# Patient Record
Sex: Male | Born: 1955 | Race: White | Hispanic: No | Smoking: Former smoker
Health system: Southern US, Community
[De-identification: ages and names within clinical notes are randomized; demographics above are authoritative.]

## PROBLEM LIST (undated history)

## (undated) DIAGNOSIS — K635 Polyp of colon: Secondary | ICD-10-CM

## (undated) DIAGNOSIS — G8929 Other chronic pain: Secondary | ICD-10-CM

## (undated) DIAGNOSIS — E7800 Pure hypercholesterolemia, unspecified: Secondary | ICD-10-CM

## (undated) DIAGNOSIS — N400 Enlarged prostate without lower urinary tract symptoms: Secondary | ICD-10-CM

## (undated) DIAGNOSIS — F32A Depression, unspecified: Secondary | ICD-10-CM

## (undated) DIAGNOSIS — I10 Essential (primary) hypertension: Secondary | ICD-10-CM

## (undated) DIAGNOSIS — Z9289 Personal history of other medical treatment: Secondary | ICD-10-CM

## (undated) DIAGNOSIS — K649 Unspecified hemorrhoids: Secondary | ICD-10-CM

## (undated) DIAGNOSIS — N209 Urinary calculus, unspecified: Secondary | ICD-10-CM

## (undated) DIAGNOSIS — K559 Vascular disorder of intestine, unspecified: Secondary | ICD-10-CM

## (undated) DIAGNOSIS — J189 Pneumonia, unspecified organism: Secondary | ICD-10-CM

## (undated) DIAGNOSIS — G4730 Sleep apnea, unspecified: Secondary | ICD-10-CM

## (undated) DIAGNOSIS — F329 Major depressive disorder, single episode, unspecified: Secondary | ICD-10-CM

## (undated) DIAGNOSIS — I499 Cardiac arrhythmia, unspecified: Secondary | ICD-10-CM

## (undated) DIAGNOSIS — M1990 Unspecified osteoarthritis, unspecified site: Secondary | ICD-10-CM

## (undated) DIAGNOSIS — J302 Other seasonal allergic rhinitis: Secondary | ICD-10-CM

## (undated) HISTORY — PX: BACK SURGERY: SHX140

## (undated) HISTORY — DX: Personal history of other medical treatment: Z92.89

## (undated) HISTORY — DX: Polyp of colon: K63.5

## (undated) HISTORY — PX: WRIST SURGERY: SHX841

## (undated) HISTORY — PX: ELBOW SURGERY: SHX618

## (undated) HISTORY — DX: Pure hypercholesterolemia, unspecified: E78.00

## (undated) HISTORY — PX: SPINE SURGERY: SHX786

## (undated) HISTORY — PX: SHOULDER SURGERY: SHX246

## (undated) HISTORY — DX: Vascular disorder of intestine, unspecified: K55.9

## (undated) HISTORY — PX: NECK SURGERY: SHX720

## (undated) HISTORY — DX: Sleep apnea, unspecified: G47.30

## (undated) HISTORY — DX: Essential (primary) hypertension: I10

## (undated) HISTORY — DX: Unspecified hemorrhoids: K64.9

## (undated) HISTORY — PX: NASAL SINUS SURGERY: SHX719

## (undated) HISTORY — DX: Depression, unspecified: F32.A

## (undated) HISTORY — DX: Benign prostatic hyperplasia without lower urinary tract symptoms: N40.0

## (undated) HISTORY — PX: TRIGGER FINGER RELEASE: SHX641

## (undated) HISTORY — PX: OTHER SURGICAL HISTORY: SHX169

## (undated) HISTORY — DX: Other seasonal allergic rhinitis: J30.2

## (undated) HISTORY — DX: Unspecified osteoarthritis, unspecified site: M19.90

## (undated) HISTORY — DX: Major depressive disorder, single episode, unspecified: F32.9

---

## 1999-03-12 ENCOUNTER — Emergency Department (HOSPITAL_COMMUNITY): Admission: EM | Admit: 1999-03-12 | Discharge: 1999-03-12 | Payer: Self-pay | Admitting: Emergency Medicine

## 1999-03-13 ENCOUNTER — Encounter: Payer: Self-pay | Admitting: Emergency Medicine

## 1999-03-14 ENCOUNTER — Emergency Department (HOSPITAL_COMMUNITY): Admission: EM | Admit: 1999-03-14 | Discharge: 1999-03-14 | Payer: Self-pay | Admitting: Emergency Medicine

## 1999-06-20 ENCOUNTER — Encounter: Admission: RE | Admit: 1999-06-20 | Discharge: 1999-07-29 | Payer: Self-pay | Admitting: Family Medicine

## 1999-06-25 ENCOUNTER — Encounter: Payer: Self-pay | Admitting: Family Medicine

## 1999-06-25 ENCOUNTER — Ambulatory Visit (HOSPITAL_COMMUNITY): Admission: RE | Admit: 1999-06-25 | Discharge: 1999-06-25 | Payer: Self-pay | Admitting: Family Medicine

## 1999-10-11 ENCOUNTER — Encounter: Admission: RE | Admit: 1999-10-11 | Discharge: 1999-11-22 | Payer: Self-pay | Admitting: Neurology

## 2000-08-07 ENCOUNTER — Encounter: Payer: Self-pay | Admitting: Family Medicine

## 2000-08-07 ENCOUNTER — Encounter: Admission: RE | Admit: 2000-08-07 | Discharge: 2000-08-07 | Payer: Self-pay | Admitting: Family Medicine

## 2001-01-11 ENCOUNTER — Encounter: Payer: Self-pay | Admitting: Neurosurgery

## 2001-01-11 ENCOUNTER — Ambulatory Visit (HOSPITAL_COMMUNITY): Admission: RE | Admit: 2001-01-11 | Discharge: 2001-01-11 | Payer: Self-pay | Admitting: Neurosurgery

## 2001-02-06 ENCOUNTER — Ambulatory Visit (HOSPITAL_COMMUNITY): Admission: RE | Admit: 2001-02-06 | Discharge: 2001-02-06 | Payer: Self-pay | Admitting: Neurosurgery

## 2001-02-06 ENCOUNTER — Encounter: Payer: Self-pay | Admitting: Neurosurgery

## 2001-07-22 ENCOUNTER — Emergency Department (HOSPITAL_COMMUNITY): Admission: EM | Admit: 2001-07-22 | Discharge: 2001-07-22 | Payer: Self-pay | Admitting: Emergency Medicine

## 2001-07-22 ENCOUNTER — Encounter: Payer: Self-pay | Admitting: Emergency Medicine

## 2003-06-24 ENCOUNTER — Encounter: Admission: RE | Admit: 2003-06-24 | Discharge: 2003-06-24 | Payer: Self-pay | Admitting: Family Medicine

## 2004-04-16 ENCOUNTER — Emergency Department: Payer: Self-pay | Admitting: Emergency Medicine

## 2004-10-12 ENCOUNTER — Ambulatory Visit (HOSPITAL_COMMUNITY): Admission: RE | Admit: 2004-10-12 | Discharge: 2004-10-12 | Payer: Self-pay | Admitting: Neurosurgery

## 2005-01-04 ENCOUNTER — Encounter: Admission: RE | Admit: 2005-01-04 | Discharge: 2005-04-04 | Payer: Self-pay | Admitting: Orthopedic Surgery

## 2005-01-25 ENCOUNTER — Emergency Department (HOSPITAL_COMMUNITY): Admission: EM | Admit: 2005-01-25 | Discharge: 2005-01-26 | Payer: Self-pay | Admitting: Emergency Medicine

## 2005-01-27 ENCOUNTER — Emergency Department (HOSPITAL_COMMUNITY): Admission: EM | Admit: 2005-01-27 | Discharge: 2005-01-27 | Payer: Self-pay | Admitting: Diagnostic Radiology

## 2005-01-30 ENCOUNTER — Inpatient Hospital Stay (HOSPITAL_COMMUNITY): Admission: EM | Admit: 2005-01-30 | Discharge: 2005-02-02 | Payer: Self-pay | Admitting: Emergency Medicine

## 2005-01-31 ENCOUNTER — Encounter (INDEPENDENT_AMBULATORY_CARE_PROVIDER_SITE_OTHER): Payer: Self-pay | Admitting: *Deleted

## 2005-02-13 LAB — HM COLONOSCOPY: HM Colonoscopy: NORMAL

## 2005-03-06 ENCOUNTER — Encounter: Admission: RE | Admit: 2005-03-06 | Discharge: 2005-03-06 | Payer: Self-pay | Admitting: Gastroenterology

## 2005-04-12 ENCOUNTER — Encounter: Admission: RE | Admit: 2005-04-12 | Discharge: 2005-04-12 | Payer: Self-pay | Admitting: Gastroenterology

## 2005-09-25 ENCOUNTER — Ambulatory Visit: Payer: Self-pay | Admitting: Family Medicine

## 2006-05-22 ENCOUNTER — Emergency Department (HOSPITAL_COMMUNITY): Admission: EM | Admit: 2006-05-22 | Discharge: 2006-05-22 | Payer: Self-pay | Admitting: Emergency Medicine

## 2006-06-05 ENCOUNTER — Ambulatory Visit (HOSPITAL_BASED_OUTPATIENT_CLINIC_OR_DEPARTMENT_OTHER): Admission: RE | Admit: 2006-06-05 | Discharge: 2006-06-05 | Payer: Self-pay | Admitting: Family Medicine

## 2006-06-10 ENCOUNTER — Ambulatory Visit: Payer: Self-pay | Admitting: Internal Medicine

## 2006-08-29 ENCOUNTER — Encounter: Admission: RE | Admit: 2006-08-29 | Discharge: 2006-08-29 | Payer: Self-pay | Admitting: Family Medicine

## 2006-09-04 ENCOUNTER — Encounter: Admission: RE | Admit: 2006-09-04 | Discharge: 2006-09-04 | Payer: Self-pay | Admitting: Neurosurgery

## 2006-09-12 ENCOUNTER — Observation Stay (HOSPITAL_COMMUNITY): Admission: RE | Admit: 2006-09-12 | Discharge: 2006-09-13 | Payer: Self-pay | Admitting: Otolaryngology

## 2006-10-18 ENCOUNTER — Ambulatory Visit (HOSPITAL_COMMUNITY): Admission: RE | Admit: 2006-10-18 | Discharge: 2006-10-19 | Payer: Self-pay | Admitting: Neurosurgery

## 2006-11-16 ENCOUNTER — Emergency Department (HOSPITAL_COMMUNITY): Admission: EM | Admit: 2006-11-16 | Discharge: 2006-11-16 | Payer: Self-pay | Admitting: Emergency Medicine

## 2007-05-07 ENCOUNTER — Ambulatory Visit: Payer: Self-pay | Admitting: Internal Medicine

## 2007-05-07 DIAGNOSIS — S139XXA Sprain of joints and ligaments of unspecified parts of neck, initial encounter: Secondary | ICD-10-CM | POA: Insufficient documentation

## 2007-06-12 ENCOUNTER — Encounter: Admission: RE | Admit: 2007-06-12 | Discharge: 2007-06-12 | Payer: Self-pay | Admitting: Occupational Medicine

## 2007-09-03 ENCOUNTER — Emergency Department (HOSPITAL_COMMUNITY): Admission: EM | Admit: 2007-09-03 | Discharge: 2007-09-03 | Payer: Self-pay | Admitting: Emergency Medicine

## 2007-11-04 ENCOUNTER — Inpatient Hospital Stay (HOSPITAL_COMMUNITY): Admission: EM | Admit: 2007-11-04 | Discharge: 2007-11-06 | Payer: Self-pay | Admitting: Emergency Medicine

## 2007-11-13 ENCOUNTER — Encounter: Admission: RE | Admit: 2007-11-13 | Discharge: 2007-11-13 | Payer: Self-pay | Admitting: Neurosurgery

## 2008-01-14 ENCOUNTER — Inpatient Hospital Stay (HOSPITAL_COMMUNITY): Admission: EM | Admit: 2008-01-14 | Discharge: 2008-01-20 | Payer: Self-pay | Admitting: Emergency Medicine

## 2009-06-14 ENCOUNTER — Ambulatory Visit: Payer: Self-pay | Admitting: Family Medicine

## 2009-06-14 DIAGNOSIS — I10 Essential (primary) hypertension: Secondary | ICD-10-CM

## 2009-06-14 DIAGNOSIS — N400 Enlarged prostate without lower urinary tract symptoms: Secondary | ICD-10-CM | POA: Insufficient documentation

## 2009-06-14 DIAGNOSIS — N529 Male erectile dysfunction, unspecified: Secondary | ICD-10-CM | POA: Insufficient documentation

## 2009-06-14 DIAGNOSIS — F17200 Nicotine dependence, unspecified, uncomplicated: Secondary | ICD-10-CM

## 2009-07-07 ENCOUNTER — Encounter (INDEPENDENT_AMBULATORY_CARE_PROVIDER_SITE_OTHER): Payer: Self-pay | Admitting: *Deleted

## 2009-07-07 ENCOUNTER — Ambulatory Visit: Payer: Self-pay | Admitting: Family Medicine

## 2009-07-07 DIAGNOSIS — E785 Hyperlipidemia, unspecified: Secondary | ICD-10-CM | POA: Insufficient documentation

## 2009-07-07 LAB — CONVERTED CEMR LAB
Albumin: 4 g/dL (ref 3.5–5.2)
BUN: 8 mg/dL (ref 6–23)
Calcium: 9.1 mg/dL (ref 8.4–10.5)
Cholesterol: 178 mg/dL (ref 0–200)
Creatinine, Ser: 0.9 mg/dL (ref 0.4–1.5)
GFR calc non Af Amer: 94.05 mL/min (ref >60)
Glucose, Bld: 95 mg/dL (ref 70–99)
HDL: 49.8 mg/dL (ref >39.00)
Sodium: 139 meq/L (ref 135–145)
Total Protein: 7 g/dL (ref 6.0–8.3)
Triglycerides: 171 mg/dL — ABNORMAL HIGH (ref 0.0–149.0)
VLDL: 34.2 mg/dL (ref 0.0–40.0)

## 2009-08-02 ENCOUNTER — Telehealth (INDEPENDENT_AMBULATORY_CARE_PROVIDER_SITE_OTHER): Payer: Self-pay | Admitting: *Deleted

## 2009-08-03 ENCOUNTER — Encounter: Payer: Self-pay | Admitting: Family Medicine

## 2009-08-09 ENCOUNTER — Telehealth (INDEPENDENT_AMBULATORY_CARE_PROVIDER_SITE_OTHER): Payer: Self-pay | Admitting: *Deleted

## 2009-08-13 ENCOUNTER — Ambulatory Visit: Payer: Self-pay | Admitting: Family Medicine

## 2009-08-13 DIAGNOSIS — G43009 Migraine without aura, not intractable, without status migrainosus: Secondary | ICD-10-CM | POA: Insufficient documentation

## 2009-08-15 ENCOUNTER — Encounter: Admission: RE | Admit: 2009-08-15 | Discharge: 2009-08-15 | Payer: Self-pay | Admitting: Neurosurgery

## 2009-08-20 ENCOUNTER — Telehealth: Payer: Self-pay | Admitting: Family Medicine

## 2009-10-05 ENCOUNTER — Ambulatory Visit: Payer: Self-pay | Admitting: Family Medicine

## 2009-10-13 ENCOUNTER — Telehealth (INDEPENDENT_AMBULATORY_CARE_PROVIDER_SITE_OTHER): Payer: Self-pay | Admitting: *Deleted

## 2009-10-13 ENCOUNTER — Ambulatory Visit: Payer: Self-pay | Admitting: Family Medicine

## 2009-10-27 ENCOUNTER — Telehealth: Payer: Self-pay | Admitting: Family Medicine

## 2009-11-02 ENCOUNTER — Emergency Department (HOSPITAL_COMMUNITY): Admission: EM | Admit: 2009-11-02 | Discharge: 2009-11-02 | Payer: Self-pay | Admitting: Emergency Medicine

## 2009-12-08 ENCOUNTER — Encounter: Admission: RE | Admit: 2009-12-08 | Discharge: 2009-12-08 | Payer: Self-pay | Admitting: Neurosurgery

## 2010-01-06 ENCOUNTER — Ambulatory Visit: Payer: Self-pay | Admitting: Family Medicine

## 2010-01-06 DIAGNOSIS — M545 Low back pain: Secondary | ICD-10-CM

## 2010-01-26 ENCOUNTER — Encounter: Admission: RE | Admit: 2010-01-26 | Discharge: 2010-01-26 | Payer: Self-pay | Admitting: Neurosurgery

## 2010-04-19 ENCOUNTER — Telehealth (INDEPENDENT_AMBULATORY_CARE_PROVIDER_SITE_OTHER): Payer: Self-pay | Admitting: *Deleted

## 2010-04-21 ENCOUNTER — Ambulatory Visit: Payer: Self-pay | Admitting: Family Medicine

## 2010-04-21 DIAGNOSIS — M542 Cervicalgia: Secondary | ICD-10-CM | POA: Insufficient documentation

## 2010-04-27 ENCOUNTER — Encounter: Admission: RE | Admit: 2010-04-27 | Discharge: 2010-04-27 | Payer: Self-pay | Admitting: Family Medicine

## 2010-05-05 ENCOUNTER — Ambulatory Visit: Payer: Self-pay | Admitting: Family Medicine

## 2010-05-06 ENCOUNTER — Ambulatory Visit: Payer: Self-pay | Admitting: Family Medicine

## 2010-05-09 LAB — CONVERTED CEMR LAB
ALT: 17 units/L (ref 0–53)
BUN: 15 mg/dL (ref 6–23)
Bilirubin, Direct: 0.2 mg/dL (ref 0.0–0.3)
Chloride: 107 meq/L (ref 96–112)
Cholesterol: 162 mg/dL (ref 0–200)
Creatinine, Ser: 0.8 mg/dL (ref 0.4–1.5)
GFR calc non Af Amer: 102.93 mL/min (ref >60)
HDL: 40 mg/dL (ref >39.00)
LDL Cholesterol: 98 mg/dL (ref 0–99)
Total Bilirubin: 0.7 mg/dL (ref 0.3–1.2)
Total CHOL/HDL Ratio: 4
Triglycerides: 119 mg/dL (ref 0.0–149.0)
VLDL: 23.8 mg/dL (ref 0.0–40.0)

## 2010-06-07 ENCOUNTER — Telehealth: Payer: Self-pay | Admitting: Family Medicine

## 2010-06-21 ENCOUNTER — Telehealth (INDEPENDENT_AMBULATORY_CARE_PROVIDER_SITE_OTHER): Payer: Self-pay | Admitting: *Deleted

## 2010-07-17 HISTORY — PX: BACK SURGERY: SHX140

## 2010-07-27 ENCOUNTER — Telehealth: Payer: Self-pay | Admitting: Family Medicine

## 2010-08-03 ENCOUNTER — Encounter: Payer: Self-pay | Admitting: Family Medicine

## 2010-08-14 LAB — CONVERTED CEMR LAB
ALT: 20 units/L (ref 0–53)
AST: 21 units/L (ref 0–37)
Albumin: 3.4 g/dL — ABNORMAL LOW (ref 3.5–5.2)
Alkaline Phosphatase: 61 units/L (ref 39–117)
CO2: 31 meq/L (ref 19–32)
Calcium: 8.8 mg/dL (ref 8.4–10.5)
Creatinine, Ser: 0.9 mg/dL (ref 0.4–1.5)
Eosinophils Relative: 1.9 % (ref 0.0–5.0)
GFR calc non Af Amer: 93.95 mL/min (ref >60)
Glucose, Bld: 101 mg/dL — ABNORMAL HIGH (ref 70–99)
HCT: 47.5 % (ref 39.0–52.0)
Hemoglobin: 15.4 g/dL (ref 13.0–17.0)
Lymphocytes Relative: 19.7 % (ref 12.0–46.0)
Lymphs Abs: 1.9 10*3/uL (ref 0.7–4.0)
Monocytes Relative: 7.2 % (ref 3.0–12.0)
Neutro Abs: 7 10*3/uL (ref 1.4–7.7)
Sodium: 144 meq/L (ref 135–145)
Total Bilirubin: 0.3 mg/dL (ref 0.3–1.2)
WBC: 9.8 10*3/uL (ref 4.5–10.5)

## 2010-08-17 ENCOUNTER — Ambulatory Visit: Admit: 2010-08-17 | Payer: Self-pay | Admitting: Family Medicine

## 2010-08-17 ENCOUNTER — Encounter: Payer: Self-pay | Admitting: Family Medicine

## 2010-08-18 NOTE — Progress Notes (Signed)
Summary: Pt still having pain/waiting on cb from [facility] 11/22  Phone Note Call from Patient   Caller: Patient Call For: Loreen Freud DO Details for Reason: Still having Heasdaches Summary of Call: Rcv'd mssg from patient and he stated that he was having headaches and using 10-12 BC powders per day, says there is no change from the last OV also c/o groin pain.... Almeta Monas CMA Duncan Dull)  June 07, 2010 12:07 PM   I called patient and he stated he has been having the neck pain that is causing the headAches, says he is taking 10-12 BC powder's per day and it is not helping at all, I asked was he seeing pain management, says he has been waiting for an appt for the last 2 months, asked has he call [facility] about appt and he stated he left 2 mssg's with no reply.  says he has a f/u in Jan, adv pt he will need to be seen sooner for this pain, advised he can't continue to take [facility] powder especially if it is not working. He voiced understanding, advised pt I will call [facility] and find out what is going with appt's and see if we can get him seen sooner, he agreed.  I called [facility] and Dr.Roy was in surgery all day, His nurse Tammy was off, and his assistant Amil Amen was at lunch, I left a voicemail for Amil Amen to give [place] a call back..... Almeta Monas CMA Duncan Dull)  June 07, 2010 12:10 PM   Follow-up for Phone Call        spk with Amil Amen and she stated that patient gets meds from [facility] every month pt gets Percocet and Flexeril, as of september 16th he is taking 3 of each everyday. Says hewas released from Dr. Thyra Breed (pain management) for failing 2 drug test and he had the fentyl patch on at both drug screens, per Amil Amen patient stated he did not know how they got there. The patient is supposed to find a pain clinic on his own but never contacted them with details and they sent referral informatoin to a Dr.Rouse in [place] for pain management and waiting on a call back. She stated the only time the  patient calls them is when he needs a refill, he never advised them he was taking so many BC powders. Patient called for a refill of pain meds today and she asked did he ask [place] for some, I advised no.  She stated she will let Dr.Roy know about this phone call as well.....  Follow-up by: Almeta Monas CMA Duncan Dull),  June 08, 2010 10:58 AM  Additional Follow-up for Phone Call Additional follow up Details #1::        Call back from [place] who stated she spoke with Dr. Channing Mutters and he wanted Dr.Lowne to handle the pt's issue of lying in the previous conversation.... Additional Follow-up by: Almeta Monas CMA Duncan Dull),  June 08, 2010 11:02 AM    Additional Follow-up for Phone Call Additional follow up Details #2::    We will not be able to give the pt any pain medication.  He is getting monthly meds from Dr Channing Mutters and they are getting him into pain management which may take a while since he failed to drug screens in previous pain management.   Follow-up by: Loreen Freud DO,  June 08, 2010 11:18 AM  Additional Follow-up for Phone Call Additional follow up Details #3:: Details for Additional Follow-up Action Taken: spoke with pt and I  made him aware of the above, advised to f/u for pain with pain management and/or Dr.Roy's office. Additional Follow-up by: Almeta Monas CMA Duncan Dull),  June 08, 2010 12:05 PM

## 2010-08-18 NOTE — Progress Notes (Signed)
Summary: Wants Maxalt Refill/Sample  Phone Note Call from Patient   Refills Requested: Medication #1:  MAXALT-MLT 10 MG TBDP as directed--no more than 2 in 24 hours Caller: Patient Call For: Loreen Freud DO Summary of Call: spoke with patient and he stated he had a nerve study done and now he has a Migraine, wanted to know if he could get his Maxalt called to [facility] on [address] and also a sample of Levitra.... I advised we did not have any Levitra samples. He voiced understaning. Maxalt last filled  08/20/09 and patient last seen 05/10/10 please advise.... Almeta Monas CMA Duncan Dull)  July 27, 2010 4:47 PM   Follow-up for Phone Call        ok to refill maxalt 1x Follow-up by: Loreen Freud DO,  July 28, 2010 7:36 AM  Additional Follow-up for Phone Call Additional follow up Details #1::        pt aware Rx sent to the pharmacy... Almeta Monas CMA Duncan Dull)  July 28, 2010 9:18 AM     Prescriptions: MAXALT-MLT 10 MG TBDP (RIZATRIPTAN BENZOATE) as directed--no more than 2 in 24 hours  #9 x 0   Entered and Authorized by:   Loreen Freud DO   Signed by:   Loreen Freud DO on 07/28/2010   Method used:   Electronically to        [facility] [address] (retail)       [phone]       [place], [place]  [address]       Ph: [phone]       Fax: [phone]   RxID:   [id number]

## 2010-08-18 NOTE — Medication Information (Signed)
Summary: Prior Authorization & Approval for [employer]  Prior Authorization & Approval for [employer]   Imported By: Lanelle Bal 08/11/2009 13:10:49  _____________________________________________________________________  External Attachment:    Type:   Image     Comment:   External Document

## 2010-08-18 NOTE — Progress Notes (Signed)
Summary: Maxalt refill  Phone Note Call from Patient   Summary of Call: Pt called and said that he is out of the samples of Maxalt that you gave him. He has been using Ibruprofen with the oxycodone. Will you refill the Maxalf for him? Army Fossa CMA  August 20, 2009 11:29 AM   Follow-up for Phone Call        see maxalt on med list--ok to refill x1 Follow-up by: Loreen Freud DO,  August 20, 2009 1:14 PM  Additional Follow-up for Phone Call Additional follow up Details #1::        pt aware. Army Fossa CMA  August 20, 2009 1:21 PM     New/Updated Medications: MAXALT-MLT 10 MG TBDP (RIZATRIPTAN BENZOATE) as directed--no more than 2 in 24 hours Prescriptions: MAXALT-MLT 10 MG TBDP (RIZATRIPTAN BENZOATE) as directed--no more than 2 in 24 hours  #9 x 0   Entered by:   Army Fossa CMA   Authorized by:   Loreen Freud DO   Signed by:   Army Fossa CMA on 08/20/2009   Method used:   Electronically to        [facility] [id number]* (retail)       [address] [place], [place]  [address]       Ph: [phone]       Fax: [phone]   RxID:   [id number] MAXALT-MLT 10 MG TBDP (RIZATRIPTAN BENZOATE) as directed--no more than 2 in 24 hours  #9 x 0   Entered and Authorized by:   Loreen Freud DO   Signed by:   Loreen Freud DO on 08/20/2009   Method used:   Historical   RxID:   [id number]

## 2010-08-18 NOTE — Progress Notes (Signed)
Summary: Lab Results  Phone Note Outgoing Call   Call placed by: [facility] CMA,  October 27, 2009 4:24 PM Summary of Call: left message to call office.............Marland KitchenFelecia Deloach CMA  October 27, 2009 4:24 PM   LDL particles are elevated---pt needs meds---he was on crestor in past but stopped it---- was it because of SE---- if not we need to start again recheck 3 months-----nmr, hep 272.4  Follow-up for Phone Call        Left message to call back. Army Fossa CMA  October 28, 2009 10:55 AM   Additional Follow-up for Phone Call Additional follow up Details #1::        Left message to call back. Army Fossa CMA  October 29, 2009 10:40 AM     Additional Follow-up for Phone Call Additional follow up Details #2::    Left message to call back. Army Fossa CMA  November 01, 2009 12:42 PM   Additional Follow-up for Phone Call Additional follow up Details #3:: Details for Additional Follow-up Action Taken: Pt states she did not have any side effects to the Crestor. Army Fossa CMA  November 01, 2009 2:44 PM  Crestor 5 mg  #30  1 by mouth at bedtime , 2 refills----recheck labs 3 months---nmr, hep 272.4  yrlowne 11/01/2009 3pm  Pt is aware. Army Fossa CMA  November 01, 2009 3:06 PM   New/Updated Medications: CRESTOR 5 MG TABS (ROSUVASTATIN CALCIUM) 1 by mouth at bedtime. Prescriptions: CRESTOR 5 MG TABS (ROSUVASTATIN CALCIUM) 1 by mouth at bedtime.  #30 x 2   Entered by:   Army Fossa CMA   Authorized by:   Loreen Freud DO   Signed by:   Army Fossa CMA on 11/01/2009   Method used:   Electronically to        [facility] [address] (retail)       [phone]       [place], [place]  [address]       Ph: [phone]       Fax: [phone]   RxID:   [id number]

## 2010-08-18 NOTE — Progress Notes (Signed)
Summary: RX  Phone Note Call from Patient Call back at Home Phone [phone]   Caller: Patient Reason for Call: Refill Medication Summary of Call: PATIENT IS IN THE LABS NEED SAMPLE OF LEVTRA AND MAXALT Initial call taken by: Freddy Jaksch,  October 13, 2009 8:13 AM  Follow-up for Phone Call        gave pt 2 boxes of Maxalt. (one in each).

## 2010-08-18 NOTE — Assessment & Plan Note (Signed)
Summary: headache/neck pain/cbs   Vital Signs:  Patient profile:   55 year old male Weight:      184.8 pounds Pulse rate:   76 / minute Pulse rhythm:   regular BP sitting:   140 / 86  (right arm) Cuff size:   large  Vitals Entered By: Almeta Monas CMA Duncan Dull) (April 21, 2010 8:19 AM) CC: c/o neck pain and headache x1wk, Headaches   History of Present Illness:  Headaches      This is a 55 year old man who presents with Headaches.  The symptoms began 1 week ago.  Pt was under a sink and felt a pop in neck.  She has had headache since then.   Pt is averaging 8 goody powders ina day.  Headache is behind both eyes.  The patient denies nausea, vomiting, sweats, tearing of eyes, nasal congestion, sinus pain, sinus pressure, photophobia, and phonophobia.  The headache is described as constant and stabbing.  High-risk features (red flags) include neck pain/stiffness.  The patient denies the following high-risk features: fever, vision loss or change, focal weakness, altered mental status, rash, trauma, pain worse with exertion, new type of headache, age >50 years, immunosuppression, concomitant infection, and anticoagulation use.  Prior treatment has included     acetaminophen.  Pt seeing Dr Channing Mutters for low back --he is out of the office until the 17th.  The pt gets pain meds from him and was referred to pain management at [facility].    Current Medications (verified): 1)  Oxycodone-Acetaminophen 5-325 Mg Tabs (Oxycodone-Acetaminophen) .Marland Kitchen.. 1 By Mouth Two Times A Day 2)  Flexeril 10 Mg Tabs (Cyclobenzaprine Hcl) .Marland Kitchen.. 1 By Mouth Daily. 3)  Cialis 20 Mg Tabs (Tadalafil) .... As Directed 4)  Zestril 10 Mg Tabs (Lisinopril) .Marland Kitchen.. 1 By Mouth Once Daily 5)  Maxalt-Mlt 10 Mg Tbdp (Rizatriptan Benzoate) .... As Directed--No More Than 2 in 24 Hours 6)  Crestor 5 Mg Tabs (Rosuvastatin Calcium) .Marland Kitchen.. 1 By Mouth At Bedtime.  Allergies (verified): 1)  ! * Nuprex 2)  ! Toradol  Past History:  Past medical,  surgical, family and social histories (including risk factors) reviewed for relevance to current acute and chronic problems.  Past Medical History: Reviewed history from 06/14/2009 and no changes required. Arthritis Depression High blood pressure readings  High cholesterol  Kidney stones Migraines  Benign prostatic hypertrophy---  Dr Earlene Plater Hypertension  Past Surgical History: Reviewed history from 06/14/2009 and no changes required. L5-S1  Neck- disc 2002 and 2008 Elbow left- 2008 Right Shoulder   Family History: Reviewed history from 06/14/2009 and no changes required. Family History of Alcoholism/Addiction Family History of Arthritis  Social History: Reviewed history from 06/14/2009 and no changes required. Divorced Current Smoker Alcohol use-yes Drug use-no  Review of Systems      See HPI  Physical Exam  General:  Well-developed,well-nourished,in no acute distress; alert,appropriate and cooperative throughout examination Neck:  pt with stiffness with rotation and flex ext but is able to move neck Psych:  Oriented X3, flat affect, and subdued.     Impression & Recommendations:  Problem # 1:  NECK PAIN (ICD-723.1) pt will get refill per neurosurgery we will try to get MRI since Dr Channing Mutters is out of office His updated medication list for this problem includes:    Oxycodone-acetaminophen 5-325 Mg Tabs (Oxycodone-acetaminophen) .Marland Kitchen... 1 by mouth two times a day    Flexeril 10 Mg Tabs (Cyclobenzaprine hcl) .Marland Kitchen... 1 by mouth daily.  Orders: Radiology Referral (  Radiology)  Discussed exercises and use of moist heat or cold and medication.   Complete Medication List: 1)  Oxycodone-acetaminophen 5-325 Mg Tabs (Oxycodone-acetaminophen) .Marland Kitchen.. 1 by mouth two times a day 2)  Flexeril 10 Mg Tabs (Cyclobenzaprine hcl) .Marland Kitchen.. 1 by mouth daily. 3)  Cialis 20 Mg Tabs (Tadalafil) .... As directed 4)  Zestril 10 Mg Tabs (Lisinopril) .Marland Kitchen.. 1 by mouth once daily 5)  Maxalt-mlt 10 Mg  Tbdp (Rizatriptan benzoate) .... As directed--no more than 2 in 24 hours 6)  Crestor 5 Mg Tabs (Rosuvastatin calcium) .Marland Kitchen.. 1 by mouth at bedtime.
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
## 2010-08-18 NOTE — Progress Notes (Signed)
Summary: New Pain clinic detail 12/6  Phone Note Call from Patient   Caller: Patient Reason for Call: Talk to Doctor Summary of Call: Mssg from patient stating he got into the [facility]. Wants a call back so he can give details...[phone]... Almeta Monas CMA Duncan Dull)  June 21, 2010 5:21 PM   Left message to call back... Almeta Monas CMA Duncan Dull)  June 22, 2010 9:04 AM  Says he is going to a pain clinic in [place] says the Doctor's name was Duncan Dull, says he just wanted to let Dr.Lowne know because she was listed as her his PCP. Dr.Lowne made aware. Initial call taken by: Almeta Monas CMA [facility]),  June 22, 2010 9:12 AM

## 2010-08-18 NOTE — Progress Notes (Signed)
Summary: hEADACHE, 10/5  Phone Note Call from Patient   Caller: Patient Call For: Loreen Freud DO Details for Reason: Headache Summary of Call: pt had neck surgery in 2008, had a pop in back but surgeon is out until next week, says he got a migraine when he had the initial pop and now having headaches on pain meds with no relief, wants a callback to discuss. c/b # [id number].                Almeta Monas CMA Duncan Dull)  April 19, 2010 5:09 PM    Follow-up for Phone Call        Per Dr.Lowne pt will need an OV Follow-up by: Almeta Monas CMA Duncan Dull),  April 19, 2010 5:10 PM  Additional Follow-up for Phone Call Additional follow up Details #1::        Left message to call back Almeta Monas CMA Duncan Dull)  April 20, 2010 9:55 AM     Additional Follow-up for Phone Call Additional follow up Details #2::    spk with pt and adv he will need an OV with Dr.Lowne to evaluate, pt voiced understanding. Call xfr'd to [facility].  Follow-up by: Almeta Monas CMA Duncan Dull),  April 20, 2010 10:23 AM
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
## 2010-08-18 NOTE — Progress Notes (Signed)
Summary: PRIOR AUTHORIZATION approved [employer]  Phone Note Refill Request Message from:  Fax from Pharmacy on [facility] [address] FAX [phone]  CIALIS 5 MG TAB PRIOR AUTHORIZATION [phone]  Initial call taken by: Barb Merino,  August 02, 2009 9:03 AM  Follow-up for Phone Call        prior auth in process [employer] case #[id number]................Marland KitchenFelecia Deloach CMA  August 03, 2009 11:25 AM  prior auth faxed back awaiting response...........Marland KitchenFelecia Deloach CMA  August 03, 2009 2:46 PM   Additional Follow-up for Phone Call Additional follow up Details #1::        PRIOR AUTH APPROVED 07-13-09 UNTIL 08-03-10, PHARMACY FAXED,APPROVAL LETTER SCAN TO CHART................Marland KitchenFelecia Deloach CMA  August 04, 2009 9:20 AM

## 2010-08-18 NOTE — Letter (Signed)
Summary: Records Dated 10-30-07 thru 12-08-09/[facility]  Records Dated 10-30-07 thru 12-08-09/[facility]   Imported By: Lanelle Bal 01/13/2010 13:30:49  _____________________________________________________________________  External Attachment:    Type:   Image     Comment:   External Document

## 2010-08-18 NOTE — Assessment & Plan Note (Signed)
Summary: discuss back pain/drb   Vital Signs:  Patient profile:   55 year old male Weight:      173.13 pounds Pulse rate:   92 / minute Pulse rhythm:   regular BP sitting:   132 / 72  (left arm) Cuff size:   large  Vitals Entered By: Army Fossa CMA (January 06, 2010 9:32 AM) CC: Pt here he needs a refill on pain meds, not happy with dr that he was recommended to see for his back pain. He seems to be eating a lot but losing weight. Had a broken foot in the past.  Comments Samples of cialis if possible    History of Present Illness: Pt here to discuss his back.  He is having difficulty getting rx--he is supposed to get rx every 2 weeks but it can go longer than that until they get him the rx ---pt sees Dr Channing Mutters at [facility].  Pt is very frustrated.  He has appointment Monday.  Pt had some tests done recently and has not heard the result yet.  Pt states he decided against surgery so he was referred to pain management.   However because he ran out of medicine his girlfriend gave him a fentanyl patch-- so he failed UDS and pain management would not see him.   Pt states he is not here for pain meds.  He does need samples of his regular meds if possible.    Allergies: 1)  ! * Nuprex 2)  ! Toradol  Physical Exam  General:  Well-developed,well-nourished,in no acute distress; alert,appropriate and cooperative throughout examination Psych:  Oriented X3, good eye contact, flat affect, and subdued.     Impression & Recommendations:  Problem # 1:  LOW BACK PAIN, CHRONIC (ICD-724.2)  per Dr Channing Mutters--  keep appointment for Monday His updated medication list for this problem includes:    Oxycodone-acetaminophen 5-325 Mg Tabs (Oxycodone-acetaminophen) .Marland Kitchen... 1 by mouth two times a day    Flexeril 10 Mg Tabs (Cyclobenzaprine hcl) .Marland Kitchen... 1 by mouth daily.  Problem # 2:  HYPERLIPIDEMIA (ICD-272.4)  His updated medication list for this problem includes:    Crestor 5 Mg Tabs (Rosuvastatin calcium) .Marland Kitchen...  1 by mouth at bedtime.  Labs Reviewed: SGOT: 21 (10/13/2009)   SGPT: 20 (10/13/2009)   HDL:49.80 (07/07/2009)  LDL:94 (07/07/2009)  Chol:178 (07/07/2009)  Trig:171.0 (07/07/2009)  Problem # 3:  HYPERTENSION (ICD-401.9)  His updated medication list for this problem includes:    Zestril 10 Mg Tabs (Lisinopril) .Marland Kitchen... 1 by mouth once daily  BP today: 132/72 Prior BP: 130/76 (10/05/2009)  Labs Reviewed: K+: 4.6 (10/13/2009) Creat: : 0.9 (10/13/2009)   Chol: 178 (07/07/2009)   HDL: 49.80 (07/07/2009)   LDL: 94 (07/07/2009)   TG: 171.0 (07/07/2009)  Problem # 4:  ERECTILE DYSFUNCTION, ORGANIC (ICD-607.84)  His updated medication list for this problem includes:    Cialis 20 Mg Tabs (Tadalafil) .Marland Kitchen... As directed  Complete Medication List: 1)  Oxycodone-acetaminophen 5-325 Mg Tabs (Oxycodone-acetaminophen) .Marland Kitchen.. 1 by mouth two times a day 2)  Flexeril 10 Mg Tabs (Cyclobenzaprine hcl) .Marland Kitchen.. 1 by mouth daily. 3)  Cialis 20 Mg Tabs (Tadalafil) .... As directed 4)  Zestril 10 Mg Tabs (Lisinopril) .Marland Kitchen.. 1 by mouth once daily 5)  Maxalt-mlt 10 Mg Tbdp (Rizatriptan benzoate) .... As directed--no more than 2 in 24 hours 6)  Crestor 5 Mg Tabs (Rosuvastatin calcium) .Marland Kitchen.. 1 by mouth at bedtime.

## 2010-08-18 NOTE — Assessment & Plan Note (Signed)
Summary: FOLLOWUP//KN   Vital Signs:  Patient profile:   55 year old male Weight:      184.8 pounds Temp:     98.3 degrees F oral Pulse rate:   76 / minute Pulse rhythm:   regular BP sitting:   148 / 90  (right arm) Cuff size:   large  Vitals Entered By: Almeta Monas CMA Duncan Dull) (May 05, 2010 11:07 AM) CC: f/u--- c/o still having headaches   History of Present Illness: Pt here for f/u--- pt here for bp check and f/u MRI.   Pt still getting pain meds from Dr Channing Mutters and is waiting for pain management.     Current Medications (verified): 1)  Oxycodone-Acetaminophen 5-325 Mg Tabs (Oxycodone-Acetaminophen) .Marland Kitchen.. 1 By Mouth Two Times A Day 2)  Flexeril 10 Mg Tabs (Cyclobenzaprine Hcl) .Marland Kitchen.. 1 By Mouth Daily. 3)  Cialis 20 Mg Tabs (Tadalafil) .... As Directed 4)  Lisinopril 20 Mg Tabs (Lisinopril) .Marland Kitchen.. 1 By Mouth Once Daily 5)  Maxalt-Mlt 10 Mg Tbdp (Rizatriptan Benzoate) .... As Directed--No More Than 2 in 24 Hours 6)  Crestor 5 Mg Tabs (Rosuvastatin Calcium) .Marland Kitchen.. 1 By Mouth At Bedtime.  Allergies (verified): 1)  ! * Nuprex 2)  ! Toradol  Past History:  Past medical, surgical, family and social histories (including risk factors) reviewed for relevance to current acute and chronic problems.  Past Medical History: Reviewed history from 06/14/2009 and no changes required. Arthritis Depression High blood pressure readings  High cholesterol  Kidney stones Migraines  Benign prostatic hypertrophy---  Dr Earlene Plater Hypertension  Past Surgical History: Reviewed history from 06/14/2009 and no changes required. L5-S1  Neck- disc 2002 and 2008 Elbow left- 2008 Right Shoulder   Family History: Reviewed history from 06/14/2009 and no changes required. Family History of Alcoholism/Addiction Family History of Arthritis  Social History: Reviewed history from 06/14/2009 and no changes required. Divorced Current Smoker Alcohol use-yes Drug use-no  Review of Systems      See  HPI  Physical Exam  General:  Well-developed,well-nourished,in no acute distress; alert,appropriate and cooperative throughout examination Lungs:  Normal respiratory effort, chest expands symmetrically. Lungs are clear to auscultation, no crackles or wheezes. Heart:  normal rate and no murmur.   Extremities:  No clubbing, cyanosis, edema, or deformity noted with normal full range of motion of all joints.   Skin:  Intact without suspicious lesions or rashes Psych:  Cognition and judgment appear intact. Alert and cooperative with normal attention span and concentration. No apparent delusions, illusions, hallucinations   Impression & Recommendations:  Problem # 1:  HYPERTENSION (ICD-401.9)  His updated medication list for this problem includes:    Lisinopril 20 Mg Tabs (Lisinopril) .Marland Kitchen... 1 by mouth once daily  BP today: 148/90 Prior BP: 140/86 (04/21/2010)  Labs Reviewed: K+: 4.6 (10/13/2009) Creat: : 0.9 (10/13/2009)   Chol: 178 (07/07/2009)   HDL: 49.80 (07/07/2009)   LDL: 94 (07/07/2009)   TG: 171.0 (07/07/2009)  Problem # 2:  HYPERLIPIDEMIA (ICD-272.4)  His updated medication list for this problem includes:    Crestor 5 Mg Tabs (Rosuvastatin calcium) .Marland Kitchen... 1 by mouth at bedtime.  Labs Reviewed: SGOT: 21 (10/13/2009)   SGPT: 20 (10/13/2009)   HDL:49.80 (07/07/2009)  LDL:94 (07/07/2009)  Chol:178 (07/07/2009)  Trig:171.0 (07/07/2009)  Problem # 3:  NECK PAIN (ICD-723.1) per neurosurgery His updated medication list for this problem includes:    Oxycodone-acetaminophen 5-325 Mg Tabs (Oxycodone-acetaminophen) .Marland Kitchen... 1 by mouth two times a day    Flexeril 10  Mg Tabs (Cyclobenzaprine hcl) .Marland Kitchen... 1 by mouth daily.  Complete Medication List: 1)  Oxycodone-acetaminophen 5-325 Mg Tabs (Oxycodone-acetaminophen) .Marland Kitchen.. 1 by mouth two times a day 2)  Flexeril 10 Mg Tabs (Cyclobenzaprine hcl) .Marland Kitchen.. 1 by mouth daily. 3)  Cialis 20 Mg Tabs (Tadalafil) .... As directed 4)  Lisinopril 20 Mg  Tabs (Lisinopril) .Marland Kitchen.. 1 by mouth once daily 5)  Maxalt-mlt 10 Mg Tbdp (Rizatriptan benzoate) .... As directed--no more than 2 in 24 hours 6)  Crestor 5 Mg Tabs (Rosuvastatin calcium) .Marland Kitchen.. 1 by mouth at bedtime.  Patient Instructions: 1)  fasting labs   272.4  401.9  bmp, lipid, hep 2)  f/u cpe 3-6 months Prescriptions: LISINOPRIL 20 MG TABS (LISINOPRIL) 1 by mouth once daily  #30 x 2   Entered and Authorized by:   Loreen Freud DO   Signed by:   Loreen Freud DO on 05/05/2010   Method used:   Electronically to        [facility] [address] (retail)       [phone]       [place], [place]  [address]       Ph: [phone]       Fax: [phone]   RxID:   [id number]    Orders Added: 1)  Est. Patient Level III [24401]

## 2010-09-02 ENCOUNTER — Other Ambulatory Visit: Payer: Self-pay | Admitting: Family Medicine

## 2010-09-02 ENCOUNTER — Encounter: Payer: Self-pay | Admitting: Family Medicine

## 2010-09-02 ENCOUNTER — Encounter (INDEPENDENT_AMBULATORY_CARE_PROVIDER_SITE_OTHER): Payer: BC Managed Care – PPO | Admitting: Family Medicine

## 2010-09-02 DIAGNOSIS — N400 Enlarged prostate without lower urinary tract symptoms: Secondary | ICD-10-CM

## 2010-09-02 DIAGNOSIS — E785 Hyperlipidemia, unspecified: Secondary | ICD-10-CM

## 2010-09-02 DIAGNOSIS — I10 Essential (primary) hypertension: Secondary | ICD-10-CM

## 2010-09-02 DIAGNOSIS — Z0000 Encounter for general adult medical examination without abnormal findings: Secondary | ICD-10-CM

## 2010-09-02 LAB — CONVERTED CEMR LAB
Blood in Urine, dipstick: NEGATIVE
Glucose, Urine, Semiquant: NEGATIVE
Protein, U semiquant: NEGATIVE
Urobilinogen, UA: 0.2
WBC Urine, dipstick: NEGATIVE

## 2010-09-02 LAB — HEPATIC FUNCTION PANEL
ALT: 23 U/L (ref 0–53)
AST: 20 U/L (ref 0–37)
Albumin: 4.1 g/dL (ref 3.5–5.2)
Alkaline Phosphatase: 74 U/L (ref 39–117)
Total Protein: 6.5 g/dL (ref 6.0–8.3)

## 2010-09-02 LAB — BASIC METABOLIC PANEL
BUN: 18 mg/dL (ref 6–23)
CO2: 28 mEq/L (ref 19–32)
Chloride: 107 mEq/L (ref 96–112)
GFR: 91.29 mL/min (ref >60.00)
Glucose, Bld: 87 mg/dL (ref 70–99)
Potassium: 4.8 mEq/L (ref 3.5–5.1)
Sodium: 141 mEq/L (ref 135–145)

## 2010-09-02 LAB — TSH: TSH: 0.51 u[IU]/mL (ref 0.35–5.50)

## 2010-09-03 LAB — CONVERTED CEMR LAB
Eosinophils Absolute: 0.3 10*3/uL (ref 0.0–0.7)
Eosinophils Relative: 3 % (ref 0–5)
HCT: 46.7 % (ref 39.0–52.0)
Hemoglobin: 15.9 g/dL (ref 13.0–17.0)
Lymphocytes Relative: 23 % (ref 12–46)
Lymphs Abs: 2.1 10*3/uL (ref 0.7–4.0)
MCV: 89.8 fL (ref 78.0–100.0)
Monocytes Absolute: 0.8 10*3/uL (ref 0.1–1.0)
RDW: 13.8 % (ref 11.5–15.5)
WBC: 9.3 10*3/uL (ref 4.0–10.5)

## 2010-09-07 NOTE — Assessment & Plan Note (Signed)
Summary: cpx/pt will be fasting/lch   Vital Signs:  Patient profile:   55 year old male Height:      74 inches Weight:      185.6 pounds BMI:     23.92 Pulse rate:   64 / minute Pulse rhythm:   regular BP sitting:   120 / 74  (right arm) Cuff size:   large  Vitals Entered By: Almeta Monas CMA Duncan Dull) (September 02, 2010 10:28 AM) CC: CPX/Fasting   History of Present Illness: Pt here for cpe and labs.  Pt is going to [facility] for [facility] management.    Hyperlipidemia follow-up      This is a 55 year old man who presents for Hyperlipidemia follow-up.  The patient denies muscle aches, GI upset, abdominal pain, flushing, itching, constipation, diarrhea, and fatigue.  The patient denies the following symptoms: chest pain/pressure, exercise intolerance, dypsnea, palpitations, syncope, and pedal edema.  Compliance with medications (by patient report) has been near 100%.  Dietary compliance has been good.  The patient reports no exercise.    Hypertension follow-up      The patient also presents for Hypertension follow-up.  The patient denies lightheadedness, urinary frequency, headaches, edema, impotence, rash, and fatigue.  The patient denies the following associated symptoms: chest pain, chest pressure, exercise intolerance, dyspnea, palpitations, syncope, leg edema, and pedal edema.  Compliance with medications (by patient report) has been near 100%.  The patient reports that dietary compliance has been good.  The patient reports no exercise.  Adjunctive measures currently used by the patient include salt restriction.    Preventive Screening-Counseling & Management  Alcohol-Tobacco     Alcohol drinks/day: <1     Alcohol type: all     >5/day in last 3 mos: no     Alcohol Counseling: not indicated; use of alcohol is not excessive or problematic     Feels need to cut down: no     Feels annoyed by complaints: no     Feels guilty re: drinking: no     Needs 'eye opener' in am:  no     Smoking Status: current     Smoking Cessation Counseling: yes     Smoke Cessation Stage: relapse     Packs/Day: 0.75     Year Started: 1974  Caffeine-Diet-Exercise     Caffeine use/day: 1-2     Caffeine Counseling: not indicated; caffeine use is not excessive or problematic     Does Patient Exercise: no  Hep-HIV-STD-Contraception     Dental Visit-last 6 months no     Dental Care Counseling: to seek dental care; no dental care within six months      Sexual History:  currently monogamous and divorced.        Drug Use:  never and no.    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 1 By Mouth Two Times A Day 2)  Flexeril 10 Mg Tabs (Cyclobenzaprine Hcl) .Marland Kitchen.. 1 By Mouth  Daily. 3)  Cialis 20 Mg Tabs (Tadalafil) .... As Directed 4)  Lisinopril 20 Mg Tabs (Lisinopril) .Marland Kitchen.. 1 By Mouth Once Daily 5)  Maxalt-Mlt 10 Mg Tbdp (Rizatriptan Benzoate) .... As Directed--No More Than 2 in 24 Hours 6)  Crestor 5 Mg Tabs (Rosuvastatin Calcium) .Marland Kitchen.. 1 By Mouth At Bedtime.  Current Medications (verified): 1)  Nucynta Er 50 Mg Xr12h-Tab (Tapentadol Hcl) .Marland Kitchen.. 1 By Mouth Every 8 Hours 2)  Flexeril 10 Mg Tabs (Cyclobenzaprine Hcl) .Marland Kitchen.. 1 By Mouth Daily. 3)  Levitra 20 Mg Tabs (Vardenafil Hcl) .... As Directed 4)  Lisinopril 20 Mg Tabs (Lisinopril) .Marland Kitchen.. 1 By Mouth Once Daily 5)  Maxalt-Mlt 10 Mg Tbdp (Rizatriptan Benzoate) .... As Directed--No More  Than 2 in 24 Hours 6)  Gabapentin 300 Mg Caps (Gabapentin) .... By Mouth Three Times A Day 7)  Cymbalta 30 Mg Cpep (Duloxetine Hcl) .Marland Kitchen.. 1 By Mouth Once Daily For 1 Week 8)  Cymbalta 60 Mg Cpep (Duloxetine Hcl) .Marland Kitchen.. 1 By Mouth Once Daily  Allergies (verified): 1)  ! * Nuprex 2)  ! Toradol  Past History:  Past Medical History: Last updated: 06/14/2009 Arthritis Depression High blood pressure readings  High cholesterol  Kidney stones Migraines  Benign prostatic hypertrophy---  Dr Earlene Plater Hypertension  Past Surgical History: Last updated: 06/14/2009 L5-S1  Neck- disc 2002 and 2008 Elbow left- 2008 Right Shoulder   Family History: Last updated: 06/14/2009 Family History of Alcoholism/Addiction Family History of Arthritis  Social History: Last updated: 06/14/2009 Divorced Current Smoker Alcohol use-yes Drug use-no  Risk Factors: Alcohol Use: <1 (09/02/2010) >5 drinks/d w/in last 3 months: no (09/02/2010) Caffeine Use: 1-2 (09/02/2010) Exercise: no (09/02/2010)  Risk Factors: Smoking Status: current (09/02/2010) Packs/Day: 0.75 (09/02/2010)  Family History: Reviewed history from 06/14/2009 and no changes required. Family History of Alcoholism/Addiction Family History of Arthritis  Social History: Reviewed history from 06/14/2009 and no changes required. Divorced Current Smoker Alcohol use-yes Drug use-no Packs/Day:  0.75 Does Patient Exercise:  no  Review of Systems General:  Denies chills, fatigue, fever, loss of appetite, malaise, sleep disorder, sweats, weakness, and weight loss. Eyes:  Denies blurring, discharge, double vision, eye irritation, eye pain, halos, itching, light sensitivity, red eye, vision loss-1 eye, and vision loss-both eyes. ENT:  Denies decreased hearing, difficulty swallowing, ear discharge, earache, hoarseness, nasal congestion, nosebleeds, postnasal drainage, ringing in ears, sinus pressure, and sore throat. CV:  Denies bluish  discoloration of lips or nails, chest pain or discomfort, difficulty breathing at night, difficulty breathing while lying down, fainting, fatigue, leg cramps with exertion, lightheadness, near fainting, palpitations, shortness of breath with exertion, swelling of feet, swelling of hands, and weight gain. Resp:  Denies chest discomfort, chest pain with inspiration, cough, coughing up blood, excessive snoring, hypersomnolence, morning headaches, pleuritic, shortness of breath, sputum productive, and wheezing. GI:  Denies abdominal pain, bloody stools, change in bowel habits, constipation, dark tarry stools, diarrhea, excessive appetite, gas, hemorrhoids, indigestion, loss of appetite, nausea, vomiting, vomiting blood, and yellowish skin color. GU:  Denies decreased libido, discharge, dysuria, erectile dysfunction, genital sores, hematuria, incontinence, nocturia, urinary frequency, and urinary hesitancy. MS:  Complains of joint pain; denies joint redness, joint swelling, loss of strength, low back pain, mid back pain, muscle aches, muscle , cramps, muscle weakness, stiffness, and thoracic pain. Derm:  Denies changes in color of skin, changes in nail beds, dryness, excessive perspiration, flushing, hair loss, insect bite(s), itching, lesion(s), poor wound healing, and rash. Neuro:  Denies brief paralysis, difficulty with  concentration, disturbances in coordination, falling down, headaches, inability to speak, memory loss, numbness, poor balance, seizures, sensation of room spinning, tingling, tremors, visual disturbances, and weakness. Psych:  Denies alternate hallucination ( auditory/visual), anxiety, depression, easily angered, easily tearful, irritability, mental problems, panic attacks, sense of great danger, suicidal thoughts/plans, thoughts of violence, unusual visions or sounds, and thoughts /plans of harming others. Endo:  Denies cold intolerance, excessive hunger, excessive thirst, excessive urination,  heat intolerance, polyuria, and weight change. Heme:  Denies abnormal bruising, bleeding, enlarge lymph nodes, fevers, pallor, and skin discoloration. Allergy:  Denies hives or rash, itching eyes, persistent infections, seasonal allergies, and sneezing.  Physical Exam  General:  Well-developed,well-nourished,in no acute distress; alert,appropriate and cooperative throughout examination Head:  Normocephalic and atraumatic without obvious abnormalities. No apparent alopecia or balding. Eyes:  No corneal or conjunctival inflammation noted. EOMI. Perrla. Funduscopic exam benign, without hemorrhages, exudates or papilledema. Vision grossly normal. Ears:  External ear exam shows no significant lesions or deformities.  Otoscopic examination reveals clear canals, tympanic membranes are intact bilaterally without bulging, retraction, inflammation or discharge. Hearing is grossly normal bilaterally. Nose:  External nasal examination shows no deformity or inflammation. Nasal mucosa are pink and moist without lesions or exudates. Mouth:  Oral mucosa and oropharynx without lesions or exudates.  Teeth in good repair. Neck:  supple, no carotid bruits, and no cervical lymphadenopathy.   Chest Wall:  No deformities, masses, tenderness or gynecomastia noted. Lungs:  Normal respiratory effort, chest expands symmetrically. Lungs are clear to auscultation, no crackles or wheezes. Heart:  normal rate and no murmur.   Abdomen:  Bowel sounds positive,abdomen soft and non-tender without masses, organomegaly or hernias noted. Rectal:  No external abnormalities noted. Normal sphincter tone. No rectal masses or tenderness.heme neg brown stool Genitalia:  Testes bilaterally descended without nodularity, tenderness or masses. No scrotal masses or lesions. No penis lesions or urethral discharge. Prostate:  no nodules, no asymmetry, and 1+ enlarged.   Msk:  dec rom neck pain low back Pulses:  R and L  carotid,radial,femoral,dorsalis pedis and posterior tibial pulses are full and equal bilaterally Extremities:  No clubbing, cyanosis, edema, or deformity noted Neurologic:  No cranial nerve deficits noted. Station and gait are normal. Plantar reflexes are down-going bilaterally. DTRs are symmetrical throughout. Sensory, motor and coordinative functions appear intact. Skin:  Intact without suspicious lesions or rashes Cervical Nodes:  No lymphadenopathy noted Psych:  Cognition and judgment appear intact. Alert and cooperative with normal attention span and concentration. No apparent delusions, illusions, hallucinations   Impression & Recommendations:  Problem # 1:  PREVENTIVE HEALTH CARE (ICD-V70.0)  Reviewed preventive care protocols, scheduled due services, and updated immunizations.  Orders: Venipuncture (04540) TLB-Lipid Panel (80061-LIPID) TLB-BMP (Basic Metabolic Panel-BMET) (80048-METABOL) TLB-Hepatic/Liver Function Pnl (80076-HEPATIC) TLB-TSH (Thyroid Stimulating Hormone) (84443-TSH) TLB-PSA (Prostate Specific Antigen) (84153-PSA) EKG w/ Interpretation (93000)  Problem # 2:  HYPERLIPIDEMIA (ICD-272.4)  The following medications were removed from the medication list:    Crestor 5 Mg Tabs (Rosuvastatin calcium) .Marland Kitchen... 1 by mouth at bedtime.  Orders: Venipuncture (98119) TLB-Lipid Panel (80061-LIPID) TLB-BMP (Basic Metabolic Panel-BMET) (80048-METABOL) TLB-Hepatic/Liver Function Pnl (80076-HEPATIC) TLB-TSH (Thyroid Stimulating Hormone) (84443-TSH) TLB-PSA (Prostate Specific Antigen) (84153-PSA)  Labs Reviewed: SGOT: 19 (05/06/2010)   SGPT: 17 (05/06/2010)   HDL:40.00 (05/06/2010), 49.80 (07/07/2009)  LDL:98 (05/06/2010), 94 (07/07/2009)  Chol:162 (05/06/2010), 178 (07/07/2009)  Trig:119.0 (05/06/2010), 171.0 (07/07/2009)  Problem # 3:  HYPERTENSION (ICD-401.9)  His updated medication list for this problem includes:    Lisinopril 20  Mg Tabs (Lisinopril) .Marland Kitchen... 1 by mouth  once daily  Orders: Venipuncture (98119) TLB-Lipid Panel (80061-LIPID) TLB-BMP (Basic Metabolic Panel-BMET) (80048-METABOL) TLB-Hepatic/Liver Function Pnl (80076-HEPATIC) TLB-TSH (Thyroid Stimulating Hormone) (84443-TSH) TLB-PSA (Prostate Specific Antigen) (84153-PSA)  BP today: 120/74 Prior BP: 148/90 (05/05/2010)  Labs Reviewed: K+: 4.6 (05/06/2010) Creat: : 0.8 (05/06/2010)   Chol: 162 (05/06/2010)   HDL: 40.00 (05/06/2010)   LDL: 98 (05/06/2010)   TG: 119.0 (05/06/2010)  Problem # 4:  ERECTILE DYSFUNCTION, ORGANIC (ICD-607.84)  His updated medication list for this problem includes:    Levitra 20 Mg Tabs (Vardenafil hcl) .Marland Kitchen... As directed  Problem # 5:  NECK SPRAIN AND STRAIN (ICD-847.0)  His updated medication list for this problem includes:    Nucynta Er 50 Mg Xr12h-tab (Tapentadol hcl) .Marland Kitchen... 1 by mouth every 8 hours    Flexeril 10 Mg Tabs (Cyclobenzaprine hcl) .Marland Kitchen... 1 by mouth daily.  Problem # 6:  LOW BACK PAIN, CHRONIC (ICD-724.2)  His updated medication list for this problem includes:    Nucynta Er 50 Mg Xr12h-tab (Tapentadol hcl) .Marland Kitchen... 1 by mouth every 8 hours    Flexeril 10 Mg Tabs (Cyclobenzaprine hcl) .Marland Kitchen... 1 by mouth daily.  Problem # 7:  TOBACCO USER (ICD-305.1)  Encouraged smoking cessation and discussed different methods for smoking cessation.   Complete Medication List: 1)  Nucynta Er 50 Mg Xr12h-tab (Tapentadol hcl) .Marland Kitchen.. 1 by mouth every 8 hours 2)  Flexeril 10 Mg Tabs (Cyclobenzaprine hcl) .Marland Kitchen.. 1 by mouth daily. 3)  Levitra 20 Mg Tabs (Vardenafil hcl) .... As directed 4)  Lisinopril 20 Mg Tabs (Lisinopril) .Marland Kitchen.. 1 by mouth once daily 5)  Maxalt-mlt 10 Mg Tbdp (Rizatriptan benzoate) .... As directed--no more than 2 in 24 hours 6)  Gabapentin 300 Mg Caps (Gabapentin) .... By mouth three times a day 7)  Cymbalta 30 Mg Cpep (Duloxetine hcl) .Marland Kitchen.. 1 by mouth once daily for 1 week 8)  Cymbalta 60 Mg Cpep (Duloxetine hcl) .Marland Kitchen.. 1 by mouth once  daily Prescriptions: LEVITRA 20 MG TABS (VARDENAFIL HCL) as directed  #10 x 2   Entered and Authorized by:   Loreen Freud DO   Signed by:   Loreen Freud DO on 09/02/2010   Method used:   Electronically to        [facility] [address] (retail)       [phone]       [place], [place]  [address]       Ph: [phone]       Fax: [phone]   RxID:   [id number] CYMBALTA 60 MG CPEP (DULOXETINE HCL) 1 by mouth once daily  #30 x 2   Entered and Authorized by:   Loreen Freud DO   Signed by:   Loreen Freud DO on 09/02/2010   Method used:   Print then Give to Patient   RxID:   [id number] LISINOPRIL 20 MG TABS (LISINOPRIL) 1 by mouth once daily  #90 x 1   Entered by:   Almeta Monas CMA (AAMA)   Authorized by:   Loreen Freud DO   Signed by:   Almeta Monas CMA (AAMA) on 09/02/2010   Method used:   Faxed to ...       [facility] [address] (retail)       [phone]       [place], [place]  [address]  Ph: [phone]       Fax: [phone]   RxID:   [id number]    Orders Added: 1)  Venipuncture [72536] 2)  TLB-Lipid Panel [80061-LIPID] 3)  TLB-BMP (Basic Metabolic Panel-BMET) [80048-METABOL] 4)  TLB-Hepatic/Liver Function Pnl [80076-HEPATIC] 5)  TLB-TSH (Thyroid Stimulating Hormone) [84443-TSH] 6)  TLB-PSA (Prostate Specific Antigen) [84153-PSA] 7)  Est. Patient 40-64 years [99396] 8)  EKG w/ Interpretation [93000]    Flu Vaccine Next Due:  Refused TD Result Date:  09/07/2003 TD Result:  given TD Next Due:  10 yr Colonoscopy Result Date:  08/26/2003 Colonoscopy Result:  colitis Colonoscopy Next Due:  10 yr  Laboratory Results   Urine Tests   Date/Time Reported: September 02, 2010 1:23 PM  Routine Urinalysis   Color: yellow Appearance: Clear Glucose: negative   (Normal Range: Negative) Bilirubin: negative   (Normal Range: Negative) Ketone: negative   (Normal Range:  Negative) Spec. Gravity: >=1.030   (Normal Range: 1.003-1.035) Blood: negative   (Normal Range: Negative) pH: 6.0   (Normal Range: 5.0-8.0) Protein: negative   (Normal Range: Negative) Urobilinogen: 0.2   (Normal Range: 0-1) Nitrite: negative   (Normal Range: Negative) Leukocyte Esterace: negative   (Normal Range: Negative)         EKG  Procedure date:  09/03/2010  Findings:      normal:

## 2010-09-16 ENCOUNTER — Telehealth (INDEPENDENT_AMBULATORY_CARE_PROVIDER_SITE_OTHER): Payer: Self-pay | Admitting: *Deleted

## 2010-09-22 NOTE — Progress Notes (Signed)
Summary: thinks he has shingles  Phone Note Call from Patient Call back at Home Phone [phone]   Caller: Patient Summary of Call: patient called to speak to nurse---thinks he has shingles---left side of body above belt has bumps that are "crusting over"---itches a great deal, not painful, just itchy----going on for two days---please call him at [phone] Initial call taken by: Jerolyn Shin,  September 16, 2010 1:52 PM  Follow-up for Phone Call        spoke w/ patient says there are some redish bumps around beltline not painful just very itchy and would like to know what he should do informed patient that we could schedule to have Dr. take a look although probably not shingles. Says he will wait a few days instructed in the mean time can use some hydrocortisone cream to help w/ itching patient ok'd information and will call if no improvement....Marland KitchenMarland KitchenDoristine Devoid CMA  September 16, 2010 3:19 PM

## 2010-10-17 ENCOUNTER — Encounter: Payer: BC Managed Care – PPO | Admitting: Family Medicine

## 2010-10-17 ENCOUNTER — Encounter: Payer: Self-pay | Admitting: Family Medicine

## 2010-10-17 DIAGNOSIS — Z0289 Encounter for other administrative examinations: Secondary | ICD-10-CM

## 2010-10-18 NOTE — Progress Notes (Signed)
This encounter was created in error - please disregard.

## 2010-10-31 ENCOUNTER — Telehealth: Payer: Self-pay | Admitting: Family Medicine

## 2010-10-31 NOTE — Telephone Encounter (Addendum)
Pt states that med caused increase nervousness and hand shaking. Pt notes that symptoms only occur when it was time to take next dose. Please    Left message to call office.Willie Andrews CMA

## 2010-10-31 NOTE — Telephone Encounter (Signed)
Spoke with patient and he is aware that the samples will be left at check in, stated he will pick them up in the morning    KP

## 2010-10-31 NOTE — Telephone Encounter (Signed)
Has he been taking it?  If yes give 60 mg daily--- can give him 2 weeks worth.

## 2010-10-31 NOTE — Telephone Encounter (Signed)
Who gave him cymbalta?  We did not unless I'm reading chart wrong.  He would need ov.

## 2010-10-31 NOTE — Telephone Encounter (Signed)
Patient got samples of Cymbalta 60 and would like more samples---please call him when samples are ready for pickup----  Has also noticed that he is "getting the shakes"---started a few days after starting the Cymbalta--could they be related??    Please advise him

## 2010-10-31 NOTE — Telephone Encounter (Signed)
Per CPX 09-02-10 Pt was given Rx for cymbalta 60 but advise to do 30 mg for 1 week

## 2010-11-07 ENCOUNTER — Telehealth: Payer: Self-pay | Admitting: Family Medicine

## 2010-11-07 NOTE — Telephone Encounter (Signed)
Spoke w/ pt appt scheduled to see Dr. Laury Axon

## 2010-11-07 NOTE — Telephone Encounter (Signed)
Patient said he was given samples of a medication (couldnt remember name) at last visit---he is having a lot of leg cramps and back cramps for a while---leg cramps were [place] the [place] that he was close to tears----he has been taking potassium and eating bananas---please call him

## 2010-11-08 ENCOUNTER — Ambulatory Visit (INDEPENDENT_AMBULATORY_CARE_PROVIDER_SITE_OTHER): Payer: BC Managed Care – PPO | Admitting: Family Medicine

## 2010-11-08 ENCOUNTER — Encounter: Payer: Self-pay | Admitting: Family Medicine

## 2010-11-08 VITALS — BP 142/86 | HR 88 | Wt 178.2 lb

## 2010-11-08 DIAGNOSIS — R252 Cramp and spasm: Secondary | ICD-10-CM

## 2010-11-08 LAB — BASIC METABOLIC PANEL
CO2: 29 mEq/L (ref 19–32)
Calcium: 9.3 mg/dL (ref 8.4–10.5)
Creatinine, Ser: 0.9 mg/dL (ref 0.4–1.5)
GFR: 93.57 mL/min (ref >60.00)
Glucose, Bld: 76 mg/dL (ref 70–99)

## 2010-11-08 NOTE — Progress Notes (Signed)
  Subjective:    Patient ID: Willie Andrews, male    DOB: 02/17/1956, 55 y.o.   MRN: [id number]  HPI Pt here c/o leg cramps for years that are progressively getting worse.  Cramps are like charlie horse and can occur anytime day or night and affect entire leg b/l.  It occurs 3 x or more a week.  Pt is going to pain management in [place].      Review of Systems As above    Objective:   Physical Exam  Constitutional: He is oriented to person, place, and time. He appears well-developed and well-nourished.  Cardiovascular: Intact distal pulses and normal pulses.   Musculoskeletal: He exhibits no edema and no tenderness.  Neurological: He is alert and oriented to person, place, and time.  Skin: Skin is warm and dry.          Assessment & Plan:

## 2010-11-08 NOTE — Assessment & Plan Note (Signed)
Check labs F/u pain management  Hold crestor for 2 weeks--see if pain subsides

## 2010-11-08 NOTE — Patient Instructions (Signed)
Hold crestor for 2 weeks to see if cramps, muscle aches subside.  If no relief start it again We will check labs--if normal--f/u with pain management about spasms in leg

## 2010-11-09 ENCOUNTER — Telehealth: Payer: Self-pay | Admitting: *Deleted

## 2010-11-09 NOTE — Telephone Encounter (Signed)
Pt aware of results and stated he will call pain management in the morning    KP

## 2010-11-09 NOTE — Telephone Encounter (Addendum)
Left message to call office  Message copied by Candie Echevaria on Wed Nov 09, 2010  3:49 PM ------      Message from: Loreen Freud      Created: Tue Nov 08, 2010  4:58 PM       Normal      Pt needs to discuss spasms with pain management ---it is probably related to back pain

## 2010-11-10 ENCOUNTER — Other Ambulatory Visit (INDEPENDENT_AMBULATORY_CARE_PROVIDER_SITE_OTHER): Payer: BC Managed Care – PPO

## 2010-11-10 DIAGNOSIS — E785 Hyperlipidemia, unspecified: Secondary | ICD-10-CM

## 2010-11-10 LAB — LIPID PANEL
LDL Cholesterol: 119 mg/dL — ABNORMAL HIGH (ref 0–99)
Total CHOL/HDL Ratio: 4
VLDL: 19.2 mg/dL (ref 0.0–40.0)

## 2010-11-10 LAB — HEPATIC FUNCTION PANEL
Bilirubin, Direct: 0.1 mg/dL (ref 0.0–0.3)
Total Bilirubin: 0.8 mg/dL (ref 0.3–1.2)

## 2010-11-14 ENCOUNTER — Telehealth: Payer: Self-pay | Admitting: *Deleted

## 2010-11-14 ENCOUNTER — Encounter: Payer: Self-pay | Admitting: *Deleted

## 2010-11-14 NOTE — Telephone Encounter (Addendum)
Left message to call office,copy of labs mailed  Message copied by Candie Echevaria on Mon Nov 14, 2010  8:55 AM ------      Message from: Loreen Freud      Created: Sun Nov 13, 2010  8:23 PM       Cholesterol--- LDL goal < 100,  HDL > 40,  TG < 150.  Diet and exercise will increase HDL and decrease LDL and TG.  Fish,  Fish Oil, Flaxseed oil will also help increase the HDL and decrease Triglycerides.   Recheck labs in 3 months=------ LDL is creeping up.   If he is taking crestor 5 mg --it needs to be increased to 10 mg daily.    Lipid, hep 272.4

## 2010-11-17 NOTE — Telephone Encounter (Signed)
Left message to call office

## 2010-11-17 NOTE — Telephone Encounter (Signed)
Pt states that he has D/C crestor since 11-08-10 and leg pain has subsided. Pt is reluctant to increase or start back on crestor since he has been without pain.Marland KitchenPlease advise

## 2010-11-17 NOTE — Telephone Encounter (Signed)
Message left to call back      KP 

## 2010-11-17 NOTE — Telephone Encounter (Signed)
Try livalo 2 mg daily--we can give samples ---he should also take CoQ10 (otc).

## 2010-11-18 MED ORDER — PITAVASTATIN CALCIUM 2 MG PO TABS: 1.0000 | ORAL_TABLET | Freq: Every day | ORAL | Status: DC

## 2010-11-18 NOTE — Telephone Encounter (Signed)
Spoke to patient and he is aware of the recommendations from Dr.Lowne--samples left at check in Rx faxed to [facility] on [address]     KP

## 2010-11-18 NOTE — Telephone Encounter (Signed)
mssg left to call back     KP 

## 2010-11-29 NOTE — H&P (Signed)
NAME:  Willie Andrews, Willie Andrews NO.:  [id number]   MEDICAL RECORD NO.:  [id number]          PATIENT TYPE:  INP   LOCATION:  IC06                          FACILITY:  [facility]   PHYSICIAN:  Dorris Singh, DO    DATE OF BIRTH:  May 22, 1956   DATE OF ADMISSION:  11/04/2007  DATE OF DISCHARGE:  LH                              HISTORY & PHYSICAL   CHIEF COMPLAINT:  Overdose.   HISTORY OF PRESENT ILLNESS:  The patient is a 55 year old male who  presented to the [facility] emergency room via EMS  after an intentional overdose of Trazodone and Ambien when his  girlfriend told him that she was breaking up with them.  Basically the  overdose was precipitated by a recent argument with this person and he  was brought via EMS to be evaluated in the ED.  While interviewing the  patient he has caveat level of 5.   PAST MEDICAL HISTORY:  Significant for colitis, depression, kidney  stones, and benign prostatic hypertrophy.   PAST SURGICAL HISTORY:  He has had back surgery, cervical disk surgery,  and orthopedic procedure.   SOCIAL HISTORY:  He is a current smoker, smoking possibly about pack a  day.  He is an occasional drinker.  He currently lives with relatives  per his history.   REVIEW OF SYSTEMS:  CONSTITUTIONAL:  The patient currently is a caveat  5, unable to assess.   PHYSICAL EXAMINATION:  VITAL SIGNS:  Blood pressure 110/80, heart rate  63, he is saturating 100% on 2 liters.  GENERAL:  The patient is a 55 year old Caucasian male who is well-  developed, well-nourished, currently resting.  He will answer questions  with extreme stimuli, but he will answer to minimal degree.  HEENT:  Head is normocephalic and atraumatic.  Eyes; pupils are  pinpoint, not reactive to light.  Teeth are in poor repair.  No erythema  or exudate.  HEART:  Regular rate and rhythm.  No murmur noted.  S1 and S2 present.  LUNGS:  Clear to auscultation bilaterally.  No rales, wheezes,  or  rhonchi.  ABDOMEN:  Soft, nontender, and nondistended.  No rebound tenderness or  organomegaly noted.  EXTREMITIES:  Positive pulses.  No ecchymosis or cyanosis noted.   DISCHARGE LABORATORY DATA:  He had an ABG done.  His pH blood gas was  7.335, pCO2 47.7, pO2 113.0, and bicarb of 24.7.  His CBC was within  normal limits.  His sodium is 139, potassium 3.4, chloride 106, CO2 25,  glucose 102, BUN 13, and creatinine 0.92.  His acetaminophen level is  less than 10.  His drug screen is positive for barbiturates.  His  alcohol level is less than 5.   ASSESSMENT:  1. Intentional overdose of benzodiazepines such as Trazodone and      Ambien.  2. History of tobacco abuse.   PLAN:  He will be admitted to the ICU where he can be monitored and have  neuro checks every two hours.  He will be NPO until his level of  sedation is  improved.  He will be placed on GI and DVT prophylaxis as  well as Nicoderm patch and he was given Romazicon in the ED which has  helped to some degree.  We will continue to monitor him and change  therapy as needed.  We will also get the ACT Team to reevaluate him in  the morning and as long as he is stable we anticipate being able to  transfer him to behavioral health within the next 24-48 hours.      Dorris Singh, DO  Electronically Signed     CB/MEDQ  D:  11/05/2007  T:  11/05/2007  Job:  [id number]
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
## 2010-11-29 NOTE — Op Note (Signed)
NAME:  Willie Andrews, Willie Andrews NO.:  [id number]   MEDICAL RECORD NO.:  [id number]          PATIENT TYPE:  INP   LOCATION:  2924                         FACILITY:  [facility]   PHYSICIAN:  Dionne Ano. Gramig III, M.D.DATE OF BIRTH:  09/12/1955   DATE OF PROCEDURE:  DATE OF DISCHARGE:                               OPERATIVE REPORT   PREOPERATIVE DIAGNOSES:  Complex left wrist forearm laceration with  neurovascular injury including radial and ulnar artery, ulnar and median  nerve and oblique flexor tendons about the volar apparatus of the  forearm and wrist.   POSTOPERATIVE DIAGNOSES:  Complex left wrist forearm laceration with  neurovascular injury including radial and ulnar artery, ulnar and median  nerve and oblique flexor tendons about the volar apparatus of the  forearm and wrist.   PROCEDURES:  1. Irrigation and debridement of the skin, subcutaneous tissue, muscle      and tendon, left wrist and forearm.  This was an excisional      debridement.  2. Left open carpal tunnel release.  3. Left open Guyon canal release.  4. Fasciotomy, left forearm.  5. Repair of ulnar artery, left wrist forearm level.  6. Repair of radial artery, left wrist forearm level.  7. Repair of flexor carpi radialis, left wrist.  8. Repair of palmaris longus, left wrist.  9. Repair of flexor pollicis longus, left wrist and forearm.  10.Repair of flexor digitorum superficialis tendon to the index      finger, middle finger, ring finger, and small finger.  These were 4      separate tendon repairs.  11.Repair of flexor digitorum profundus to the index finger, middle      finger, ring finger, small finger, and left wrist.  This was a      complex tendon repair and each tendon was separately repaired.  12.Repair of ulnar nerve left wrist distal forearm level with      epineurial technique and nerve tube wrap.  13.Repair of median nerve left wrist forearm level with nerve wrap and      epineurial  repair.  14.Repair of extensor carpi ulnaris, left wrist.  15.Microscope used for the vessel and nerve repair.   SURGEON:  Dionne Ano. Amanda Pea, MD   ASSISTANT:  None.   COMPLICATIONS:  None.   ANESTHESIA:  General.   TOURNIQUET TIME:  Less than 2 hours.   INDICATIONS FOR THE PROCEDURE:  This patient presented to the emergency  room with a large butcher-knife laceration to the left wrist and forearm  with the exposed neurovascular structures.  I was asked to see him and  we quickly discussed getting him to the operative suite for I&D and  repair of structures necessary.  The patient understands risks and  benefits of the surgery including risk of infection, bleeding,  anesthesia, damage to normal structures, and failure of the surgery to  accomplish its intended goals of relieving symptoms and restoring  function.  With this in mind, he desires to proceed.  All questions have  been encouraged and answered preoperatively.   The patient was taken  to the procedural suite.  He underwent a smooth  induction of general anesthesia.  He was counseled extensively  preoperatively.  Permit was signed, time-out was called.  Preoperative  antibiotics were given.  Once under a general anesthetic, the patient  had a prep and drape performed.  He had a large amount of copious  bleeding thus we inflated the tourniquet for this.  Once this was  complete, sterile field was secured an I&D of skin, subcutaneous tissue,  muscle, and tendon was performed.  This was an excisional debridement.  Four liters was placed through the area in question which was his distal  wrist and forearm.  He severed the vascular structures, median and ulnar  nerve, and all flexor contents.  This was a huge injury with  encroachment of the volar floor of the carpus.  Following this, I then  placed new drapes, and the patient then underwent an open extension of  the incision both proximally and distally.  I very carefully  identified  the carpal tunnel releases under 4.0 loupe magnification.  Fat pad  egressed nicely and this was an open release with knife blade without  difficulty.   Following this, I performed open Guyon canal release.  This was done  formal open in nature to make sure there was no tension on the  neurovascular structures.   Following this, I performed fasciotomy of the left forearm with scissor  tip dissection.  The patient had full fascial release without  difficulty.   Following this, I identified both ulnar artery and radial artery.  These  were severed.  The radial artery had a 60% laceration.  The ulnar artery  was completely lacerated.  Median nerve, ulnar nerve, and all of the  flexor contents were completely lacerated.  At this time, I then  performed a systematic repair.  The patient underwent repair of flexor  carpi radialis about the left wrist.  Following this, he underwent  repair of the flexor pollicis longus about the left wrist.  Flexor  pollicis longus was repaired with a 4-strand 3-0 FiberWire technique.  I  approximated tendon ends without difficulty.   Following this, the flexor digitorum superficialis and profundus were  repaired.  The flexor digitorum profundus to the index finger was  repaired with a 4-strand technique.  Following this, the middle finger  was repaired with a 4-strand technique.  Following this, a ring finger  was repaired with a 4-strand technique.  Following this, the small  finger FDP was repaired with a 4-strand technique.  Thus, the FDP was  repaired at the base of the carpal canal.  This allowed for excellent  look and splay to the fingers which were previously very flaccid.  The  patient had careful identification and I made sure that I matched the  tendon injuries up nicely.  Given the FDP to the index finger and its  separate muscle belly, I made sure that I aligned both the FPL and the  FDP tendons up perfectly.   Thus, the FDP to  the index, middle, ring, and small finger as well as  the flexor pollicis longus were repaired.  This was the deep floor of  the carpal canal.  Following this, the flexor digitorum superficialis to  the index finger, middle finger, ring finger, and small finger were each  separately repaired with FiberWire with 4-strand technique.  This was a  modified [employer] to [employer] and was the more superficial repair  overlying  the carpal canal.   Following this, I identified the ECU tendon.  I have tagged this and  left this for later repair.   Once this was done, I then deflated the tourniquet, identified the  radial artery, and performed clot removal.  I dilated the vessel, placed  lidocaine over the area and then performed a 8-0 nylon repair.  Vessel  clamps, microscopic technique, and microscope were used for this and the  patient had a very nice patent radial artery with a __________ type  pulse following its repair.   Following this, the ulnar artery was repaired in similar fashion.  I  resected the distal ends, removed clot, applied lidocaine to dilate the  vessel and then lacrimal duct dilators were used to enlarge as  necessary.  I repaired it with a combination of 8-0 nylon  circumferentially.  Vessel clamp was used and microscope was used to  make sure the repair was perfect.  The repair was perfect, I was very  pleased with this and the patient had a nice pink hand following this.  Once this was done, I repaired the ulnar nerve.  This was done with  epineurial technique and a nerve wrap from [employer] was then placed  around this gently.  This was an epineurial technique under the  microscope.  I was able to align group fascicles together, but primarily  this was of course a epineurial repair.   Following this, the median nerve was repaired with epineurial technique  and a nerve wrap was placed around this as well.  The fascicles were  lined up and the vessels palmarly were  used for topographical  identification for the repair.  Given the patient's age, his strong  smoking habitus,  I have some doubt as to his spinal recovery and  prognosis, but certainly he had an excellent repair.   Thus with the ulnar nerve and median nerve repaired as well as the ulnar  artery and radial artery repaired, I then repaired the ECU tendon which  previously had stitches placed in it.  This was a Marketing executive.  The patient, of course, also had the FCR palmaris longus, FPL, FDS, and  FDP to the index through small fingers, repaired.  I then performed a  very careful cautious closure with 3-0 Prolene.  The patient had  excellent refill, good pulses, soft compartments, and the arterial  reconstruction has looked excellent.  There were no complicating  features.  Following this, I then placed him in a dorsal blocking splint  with the wrist and fingers in flexion.  There were no complications.  He  will be admitted for IV antibiotics, full vascular precautions in the  form of aspirin, dextran LMD 40 warm room, and smoking cessation, no  caffeine or chocolate and close observation.  I would give him a guarded  prognosis given the patient's age, smoking habitus, and a very severe  injury.  He will need extensive therapy.  I have discussed this with him  at length.   This was a very difficult injury, and we will need extensive work  postoperatively to try to get him to a quiescent state of affairs.  I  would expect a certain disability in the hand, especially with the nerve  and artery injury.  I would also expect that this would be a situation  where he is going to have to make accommodations and this likely may  hinder his ability to work in his normal  occupation indefinitely.  This  is to say I feel this injury is a devastating injury with certain  disabling features into the future, no matter how good he recovers.  I  have discussed these issues with him at length, do's  and don't's etc.,  and all questions have been encouraged and answered.      Dionne Ano. Everlene Other, M.D.     Nash Mantis  D:  01/15/2008  T:  01/15/2008  Job:  [id number]

## 2010-11-29 NOTE — Consult Note (Signed)
NAME:  Willie Andrews, Willie Andrews NO.:  [id number]   MEDICAL RECORD NO.:  [id number]          PATIENT TYPE:  INP   LOCATION:  2924                         FACILITY:  [facility]   PHYSICIAN:  Antonietta Breach, M.D.  DATE OF BIRTH:  1956-04-08   DATE OF CONSULTATION:  01/15/2008  DATE OF DISCHARGE:                                 CONSULTATION   REASON FOR CONSULTATION:  Major depression.   REQUESTING PHYSICIAN:  Dionne Ano. Amanda Pea, M.D.   HISTORY OF PRESENT ILLNESS:  Willie Andrews is a 55 year old male  admitted to the [facility] on 30 of June 2009 after a self laceration  of the left wrist.  The patient cut his wrist down to the bone requiring  vascular and tendon repair.  He has been having depressed mood,  decreased energy, poor concentration and insomnia.  This is his second  suicide attempt in less than 6 months.  Stress mentioned both times, has  involved some discord with his girlfriend.   The patient is currently cooperative with his bedside care.  He is  oriented to all spheres.  His memory function is intact.  He is not  having hallucinations or delusions.   PAST PSYCHIATRIC HISTORY:  Willie Andrews intentionally overdosed on  Trazodone and Ambien this past spring.  He states that he has been to  the [facility].  He also states that Trazodone was not effective for  sleep.  However, the patient stated that he received a maximum dosage of  100 mg.  He had been placed on Zoloft for a trial prior to this  admission.   There is no history of mania.  There is no history of hallucinations.  The patient has been tried on Wellbutrin in the remote past.   FAMILY PSYCHIATRIC HISTORY:  None known.   SOCIAL HISTORY:  Patient has been living with his girlfriend.  He does  not use alcohol or illegal drugs.  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   PAST MEDICAL HISTORY:  1. Kidney stones.  2. Benign prostatic hyperplasia.  3. Status post surgical  repair of left wrist laceration.   ALLERGIES:  BUPRENORPHINE.   MEDICATIONS:  MAR is reviewed.  The patient is not on any current  psychotropic medications.   LABORATORY DATA:  Sodium 143, BUN 12, creatinine 25, glucose 122.  Alcohol negative.  WBC 18.1, hemoglobin 13.6, platelet count 163.   REVIEW OF SYSTEMS:  Constitutional, Head, Ears, Eyes, Nose, Throat,  Neurologic, Psychiatric, Cardiovascular, Respiratory, Gastrointestinal,  Genitourinary, Skin, Musculoskeletal, Hematologic, Lymphatic, Endocrine,  Metabolic, all unremarkable.   EXAMINATION:  VITAL SIGNS:  Temperature 98.9, pulse 92, respiratory rate  21, blood pressure 123/70.  O2 saturation on 2 liters 98%.  GENERAL APPEARANCE:  Willie Andrews is a middle-aged male lying in a supine  position in his hospital bed with no abnormal involuntary movements.   MENTAL STATUS EXAM:  Willie Andrews is alert.  His eye contact is good.  His  attention span is mildly decreased, concentration mildly decreased.  He  is oriented to all spheres.  Mood is depressed.  Affect is constricted.  Memory function is intact to immediate, recent and remote.  Fund of  knowledge and intelligence are within normal limits.  Speech is soft  with a slightly flat prosody.  There is no dysarthria.   Thought process logical, coherent, goal directed. No looseness of  association.  Thought content:  The patient does acknowledge his self  harm intent.  There are no hallucinations, delusions.  Insight is  partial.  His judgment is intact to the need of treatment.   ASSESSMENT:  Axis I:  293.83 Mood disorder, not otherwise specified,  depressed (idiopathic and general medical factors).  Rule out major depressive disorder, recurrent, severe.  Axis II:  Defer.  Axis III:  See past medical history.  Axis IV:  Primary support group.  Axis V:  30.   Willie Andrews is still at risk to harm himself.   RECOMMENDATIONS:  1. Would continue the sitter.  2. The undersigned provided  ego supportive psychotherapy and      education.   The indications, alternatives and adverse affects of the following were  discussed with the patient.  Effexor for antidepression including the risk of high blood pressure,  Trazodone for anti-insomnia as well as augmenting Effexor including the  risk of priapism resulting in surgical induced impotence.  The patient  understands and would like to proceed with the medications as below.   Would continue the sitter.  It is anticipated that the patient will require medical clearance first.  It is then recommended that he be admitted to an inpatient psychiatric  unit for further evaluation and treatment.  This is the patient's second suicide attempt in less than 6 months.   Would start his Effexor at 37.5 mg p.o. q.a.m.  Would then titrate by  37.5 mg every 2 days to an initial target dose of 75 mg p.o. b.i.d.  Would monitor patient's blood pressure while increasing Effexor as  Effexor can raise the blood pressure.  Would order Trazodone 50 mg nightly and would increase Trazodone by 50  mg every evening until his sleep is normal, not to exceed 300 nightly.   The advantages of Trazodone include the fact that it is not habit  forming and it also can augment Effexor for anti depression.      Antonietta Breach, M.D.  Electronically Signed     JW/MEDQ  D:  01/15/2008  T:  01/15/2008  Job:  [id number]

## 2010-11-29 NOTE — Discharge Summary (Signed)
NAME:  Willie Andrews, NULL                ACCOUNT NO.:  [id number]   MEDICAL RECORD NO.:  [id number]          PATIENT TYPE:  INP   LOCATION:  A340                          FACILITY:  [facility]   PHYSICIAN:  Osvaldo Shipper, MD     DATE OF BIRTH:  Apr 21, 1956   DATE OF ADMISSION:  11/04/2007  DATE OF DISCHARGE:  LH                               DISCHARGE SUMMARY   ADDENDUM:  The discharge summary was dictated earlier today.  I was told  just a few minutes ago that one of his two blood cultures is growing  gram positive cocci in clusters.  The patient has been completely  afebrile and without any symptoms.  So I think this is most likely a  contaminant.  However just to be safe, I am going to write him a  prescription for Levaquin for 7 days.  I have told him that if he  develops high fevers at home, he needs to seek attention immediately.      Osvaldo Shipper, MD  Electronically Signed     GK/MEDQ  D:  11/06/2007  T:  11/06/2007  Job:  [id number]

## 2010-11-29 NOTE — Discharge Summary (Signed)
NAME:  Willie Andrews, Willie Andrews                ACCOUNT NO.:  [id number]   MEDICAL RECORD NO.:  [id number]          PATIENT TYPE:  INP   LOCATION:  A340                          FACILITY:  [facility]   PHYSICIAN:  Osvaldo Shipper, MD     DATE OF BIRTH:  [birth year]   DATE OF ADMISSION:  11/04/2007  DATE OF DISCHARGE:  04/22/2009LH                               DISCHARGE SUMMARY   HISTORY/PHYSICAL:  Please review H&P dictated by Dr. Elige Radon for details  regarding the patient's presenting illness.   BRIEF HOSPITAL COURSE:  Briefly, this is a 55 year old Caucasian male  who presented to the hospital with benzodiazepine overdose.  The exact  events surrounding this overdose are not very clear.  The patient is  fully awake and alert now, and denying any other problems.  He denies  any suicidal intent.  He said that he took his usual amount of trazodone  and Ambien, and apparently there was a problem waking him up.  When  confronted with the fact that he might have taken it after his  girlfriend broke up with him, he denies it.  He mentions that he  absolutely does not have any suicidal ideation or intent.  The patient  is medically stable otherwise.  He denies any other complaints.  His  examination is unremarkable.  Vital signs are all stable.  So he is  considered stable for discharge from the hospital.  He does have a  leukocytosis that was detected yesterday at 13,000, but he has been  afebrile.  His UA was clear.  He denies any cough, shortness of breath  or chest pains.  He mentioned that he has been having abdominal pains  which is being worked up by his gastroenterologist.  This is a chronic  issue ever since November 2008.   I discussed the case with Frances Maywood, the counselor from the ACT team  and he feels also that the patient will be safe to go home.  He will  contract with the patient and set him up an appointment with mental  health.  The patient is agreeable to this and so the plan will be  to  discharge him.   DISCHARGE MEDICATIONS:  1. He is on trazodone unknown dose.  2. Ambien CR unknown dose.  3. Alprazolam 0.5 mg p.r.n.  4. Citalopram which he is not taking.  5. He has been told to continue with his trazodone and Ambien, but is      to discontinue the alprazolam and the citalopram for now.   DIET:  Heart healthy.   PHYSICAL ACTIVITY:  No restrictions.  He can return to work tonight  itself.   Total time on this discharge summary 35 minutes.      Osvaldo Shipper, MD  Electronically Signed     GK/MEDQ  D:  11/06/2007  T:  11/06/2007  Job:  [id number]

## 2010-11-29 NOTE — Consult Note (Signed)
NAME:  LOYD, MARHEFKA NO.:  [id number]   MEDICAL RECORD NO.:  [id number]          PATIENT TYPE:  INP   LOCATION:  5029                         FACILITY:  [facility]   PHYSICIAN:  Antonietta Breach, M.D.  DATE OF BIRTH:  Nov 27, 1955   DATE OF CONSULTATION:  01/17/2008  DATE OF DISCHARGE:                                 CONSULTATION   Mr. Walrond reports that he is still having some insomnia.  However, his  mood is improved.  He still some decreased energy.  He has constructive  future goals and interests.  He talks open-endedly about his  constructive plans.  He has no thoughts of harming himself.  He has no  thoughts of harming others.  He has no delusions or hallucinations.  He  is socially appropriate and cooperative.  He is very satisfied with his  care in the hospital.   REVIEW OF SYSTEMS:  CARDIOVASCULAR:  No high blood pressure or other  adverse Effexor effects.   LABORATORY DATA:  Sodium 138, BUN 7, creatinine 0.66, glucose 116, WBC  11.9, hemoglobin 10.1, platelet count 120.   EXAMINATION:  VITAL SIGNS:  Temperature 98.3, pulse 87, respiratory rate  20, blood pressure 111/54, O2 saturation on 2 liters 99%.   MENTAL STATUS EXAM:  Mr. Coxe is alert.  He is oriented to all spheres.  His attention span is normal.  His concentration is normal.  His memory  is intact to immediate recent and remote.  Speech within normal limits.  Affect is mildly flat at baseline but with a broad and appropriate  range.  Mood within normal limits.  Thought process:  Logical, coherent,  goal-directed.  No looseness of associations.  Thought content:  No  thoughts of harming himself.  No thoughts of harming others.  No  delusions, no hallucinations.  Insight is good.  Judgment is intact.   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   Mr. Colley continues to be improved.  He is not  at risk to harm himself  or others.  He agrees to call emergency services immediately for any  thoughts of harming himself, thoughts of harming others or other  psychiatric emergency symptoms.   The undersigned provided ego supportive psychotherapy and education.   The patient is motivated for an intensive outpatient program.   RECOMMENDATIONS:  1. Would increase the patient's Effexor to 75 mg p.o. b.i.d. while      monitoring his blood pressure.  2. Would increase his trazodone to 250 mg every night.  If this is not      successful for insomnia, would increase by 50 mg per day to not      exceed 300 mg every night for antiinsomnia.   Would ask the social worker to set this patient up with an intensive  outpatient psychiatric program to start after discharge.  These programs  can be found at [facility], [place] or [facility].      Antonietta Breach, M.D.  Electronically Signed  JW/MEDQ  D:  01/17/2008  T:  01/17/2008  Job:  [id number]

## 2010-12-02 NOTE — Op Note (Signed)
[place]  Patient:    Willie Andrews, Willie Andrews                       MRN: [id number] Proc. Date: 01/11/01 Adm. 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  OPERATION PERFORMED:  C6-7 anterior cervical diskectomy with ____________ bone bank fusion followed by Zephyr anterior cervical plating.  SURGEON:  Reinaldo Meeker, M.D.  ASSISTANT:  Julio Sicks, M.D.  ANESTHESIA:  General.  DESCRIPTION OF PROCEDURE:  After being placed in the supine position in five pounds of halter traction, the patients neck was prepped and draped in the usual sterile fashion.  A localizing x-ray was taken prior to incision to identify the C6-7 level.  A transverse incision was made in the right anterior neck starting at the midline and heading toward the medial aspect of the sternocleidomastoid muscle.  The platysma muscle was then incised transversely.  The natural fascial plane between the strap muscles medially and the sternocleidomastoid laterally was identified and followed down to the anterior aspect of the cervical spine.  The longus colli muscles were identified and split in the midline and stripped away bilaterally with Optician, dispensing.  A second x-ray was taken to confirm approach to the C6-7 level and this was correct.  A self-retaining retractor was placed and used for the remainder of the case.  Using the 15 blade the annulus of the disk was incised.  Using pituitary rongeurs and clips, approximately 90% of the disk material was removed.  A high speed drill was used to widen the interspace.  The microscope was draped and brought into the field and used for the remainder of the case.  Using microdissection technique, the remaining disk material down to the posterior longitudinal ligament was  removed.  The ligament was then incised transversely.   ____________ removed with a Kerrison punch.  Inspection of the C6-7 foramen yielded a large number of herniated disk fragments.  These were removed to give excellent decompression of the underlying C7 nerve root.  At this point inspection was carried out in all directions for any evidence of residual compression.  None could be identified.  Large amounts of irrigation were carried out with any bleeding controlled with bipolar coagulation and Gelfoam.  Measurements were taken and a 7 mm bone bank plug was reconstituted.  After irrigating once more the plug was impacted without difficulty.  Fluoroscopy showed the plug to be in excellent condition.   At this point the microscope was removed from the field.  Appropriate length Zephyr anterior cervical plate was chosen.  Under fluoroscopic guidance, drill holes were placed followed by placement of 13 mm screws.  The locking device was then secured.  Retractor was removed and final fluoroscopy showed the plate to be in excellent position as well as the bone graft.  Large amounts of irrigation were carried out one more time.  Any bleeding was controlled with bipolar coagulation.  The wound was then closed using interrupted Vicryl in the platysma muscle, inverted 5-0 PDS in the subcuticular layer, Steri-Strips on the skin.  A sterile dressing and soft collar were applied.  The patient was extubated and taken to the recovery  room in stable condition.  DD:  01/11/01 TD:  01/11/01 Job: [id number]

## 2010-12-02 NOTE — Op Note (Signed)
NAME:  Willie Andrews, ENGRAM NO.:  [id number]   MEDICAL RECORD NO.:  [id number]          PATIENT TYPE:  INP   LOCATION:  1431                         [facility]ACILITY:  [facility]   PHYSICIAN:  Petra Kuba, M.D.    DATE OF BIRTH:  [birth year]   DATE OF PROCEDURE:  01/31/2005  DATE OF DISCHARGE:                                 OPERATIVE REPORT   PROCEDURE:  Flex sig with biopsy.   INDICATION:  Probable ischemic colitis, want to confirm. Consent was signed  after risks, benefits, methods and options were thoroughly discussed by both  Dr. Matthias Hughs yesterday and me prior to any sedation today.   MEDICINES USED:  Demerol 70, Versed 8.   DESCRIPTION OF PROCEDURE:  Rectal inspection was pertinent for small  external hemorrhoids. Digital exam was negative. The pediatric video  adjustable colonoscope was inserted. He had some brown liquid stool that  covered about half the colon, some of it was washed and suctioned. The  rectum had a minimal amount of inflammation. The sigmoid was normal except  for one small polyp being seen on insertion and then a small pedunculated  one slightly distal to that one seen on withdrawal. Beginning at about the  mid descending was changes compatible with obvious ischemic changes with  increased stool and seemingly increased inflammation proximally. We elected  to not advance past 60 cm. He was moderately tender prior to starting the  procedure. A few biopsies of the involved segments of descending were  obtained. The scope was slowly withdrawn. The polyps were seen as above in  the sigmoid but no other abnormalities. Once back in the rectum, anorectal  pull-through and retroflexion confirmed some small hemorrhoids and confirmed  a minimal amount of inflammation which looked more like some inflammation  from this constipation than an obvious colitis and a few biopsies of it were  obtained. Air was suctioned, scope removed. The patient tolerated the  procedure  well. There was no obvious immediate complication.   ENDOSCOPIC DIAGNOSIS:  1.  Internal and external hemorrhoids.  2.  Very minimal rectal inflammation status post biopsy.  3.  Two small sigmoid polyps, one pedunculated, one sessile not biopsied.  4.  Obvious descending ischemic changes status post biopsy.  5.  Only advance to about 60 cm secondary to prep and increased      inflammation.   PLAN:  Await pathology. Continue clear liquids and antibiotics. Will need a  full colonoscopy for polypectomy in 1-2 months when improved. Will follow  with you.       MEM/MEDQ  D:  01/31/2005  T:  01/31/2005  Job:  [id number]   cc:   L. Lupe Carney, M.D.  [address] [place]  [place] [address]  Fax: [phone]   Lucrezia Starch. Earlene Plater, M.D.  [address]  [place]  [place] [address]  Fax: [phone]   Deirdre Peer. Polite, M.D.

## 2010-12-02 NOTE — Op Note (Signed)
NAME:  Willie Andrews, Willie Andrews NO.:  [id number]   MEDICAL RECORD NO.:  [id number]          PATIENT TYPE:  OIB   LOCATION:  3008                         FACILITY:  [facility]   PHYSICIAN:  Reinaldo Meeker, M.D. 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   POSTOPERATIVE DIAGNOSES:  Herniated disk, C4-C5, C5-C6, with stenosis  and pseudoarthrosis, C6-C7.   PROCEDURES PERFORMED:  1. Exploration and fusion, C6-C7, removal of C6-C7 anterior cervical      hardware.  2. C4-C5, C5-C6, C6-C7 anterior cervical diskectomy with fusion      followed by C4-C5, C5-C6, and C6-C7 anterior cervical plating.   SURGEON:  Reinaldo Meeker, M.D.   ASSISTANT:  Dr. Marikay Alar.   DESCRIPTION OF PROCEDURE:  After placing in the supine position and 5-  pound Halter traction, the patient's neck was prepped and draped in the  usual sterile fashion.  Localizing fluoroscopy was used prior to  incision to identify the appropriate level.  A transverse incision was  made in the right anterior neck, starting at the midline and heading  towards the medial aspect of the sternocleidomastoid muscle.  The  platysma muscle was then incised transversely.  The natural fascial  plane between the strap muscles medially and sternocleidomastoid  laterally was identified and followed down to the anterior aspect of the  cervical spine.  Longus colli muscles were identified, split in the  midline, __________  bilaterally __________ unipolar coagulation.  Dissection was carried out further inferior to identify the previous  Zephyr plate.  This was dissected free of soft tissue and some bony  overgrowth.  The locking mechanism of the plate was unlocked and the 4  screws holding it in place were removed without difficulty.  The plate  was  then removed without difficulty.  Attention was then turned to C4-C5  and C5-C6.  Disks at both levels were incised with a 15-blade.  Using  pituitary rongeurs and curettes, approximately 90% of the disk material  was removed.  The high-speed drill was used to widen the interspace and  Kerrison punches were used to remove bony overgrowth.  The microscope  was draped, brought into the field, and used during the case.  Using  microdissection technique, the remainder of the disk material on the  posterior longitudinal ligament was removed.  The ligament was then  incised transversely and the cut was removed with a Kerrison punch.  Thorough decompression carried out, first at C4-C5, decompressing the  spinal dura and proximal nerve roots bilaterally until they were well-  decompressed.  Similar decompression was then carried out at C5-C6.  Once again, the remainder of the disk material and posterior  longitudinal ligament was removed along with bony overgrowth and  herniated disk material.  This was done to decompress the underlying  spinal dura and proximal nerve roots bilaterally until they were well-  decompressed entering their foramen.  At this time,  inspection was  carried out at C4-C5 and C5-C6 for any evidence of residual compression,  and none could be identified.  Large amounts of irrigation was carried  out and bleeding controlled by bipolar coagulation and Gelfoam.  Measurements were taken.  A 7-mm Bone Bank plug was reconstituted for C4-  C5 and 8-mm plug for C5-C6.  These were packed without difficulty and  fluoroscopy showed them to be in good position.  Attention was then  turned to the fusion at C6-C7.  There was a definite pseudoarthrosis  noted, where the previous bony plug interfaced with the C6 vertebral  endplate.  The residual bone plug was then drilled thoroughly to make  space for a 6-mm plug.  Once a large enough area was obtained, a 6-mm  plug was reconstituted, and  after irrigating confirmed hemostasis, the  plug was then packed without difficulty and fluoroscopy showed it to be  in good position.  An __________  AcuFix anterior cervical plate was  then chosen.  Under fluoroscopic guidance, drill holes were placed  followed by placing 13-mm screws x8 until the locking mechanism was  secured bilaterally at all levels.  Final fluoroscopy showed the plate,  screws, and plugs all to be in good position.  Large amounts of  irrigation was carried out and any bleeding controlled by bipolar  coagulation and Gelfoam.  The wound was then closed using interrupted  Vicryl in the platysma muscle, inverted 5-0 PDS in the subcuticular  area, and Steri-Strips on the skin.  A sterile dressing and soft collar  were applied, and the patient was extubated and taken to the recovery  room in stable condition.           ______________________________  Reinaldo Meeker, M.D.     ROK/MEDQ  D:  10/18/2006  T:  10/18/2006  Job:  [id number]

## 2010-12-02 NOTE — Procedures (Signed)
NAME:  Willie Andrews, Willie Andrews NO.:  [id number]   MEDICAL RECORD NO.:  [id number]          PATIENT TYPE:  OUT   LOCATION:  SLEEP CENTER                 FACILITY:  [facility]   PHYSICIAN:  Clinton D. Maple Hudson, MD, FCCP, FACPDATE OF BIRTH:  05[birth year]17-1956   DATE OF STUDY:                              NOCTURNAL POLYSOMNOGRAM   REFERRING PHYSICIAN:  Dr. Melvyn Neth D. Mitchell   INDICATION FOR STUDY:  Hypersomnia with sleep apnea.   EPWORTH SLEEPINESS SCORE:  7/24. BMI 25, weight 195 pounds.   MEDICATIONS:  No home medications were listed.   SLEEP ARCHITECTURE:  Total sleep time 382 minutes with sleep efficiency 88%.  Stage I was 8%, stage II 80%, stages III and IV 1%, REM 11% of total sleep  time. Sleep latency 15 minutes, REM latency 81 minutes, awake after sleep  onset 31 minutes. Arousal index increased at 41.9 indicating increased sleep  fragmentation. No bedtime medication was reported.   RESPIRATORY DATA:  Split study protocol. Apnea-hypopnea index (AHI, RDI)  25.1 per hour indicating moderate obstructive sleep apnea/hypopnea syndrome  before CPAP. This included 5 obstructive apneas and 57 hypopneas before  CPAP. Events were strongly positional, recorded only while sleeping supine.  REM AHI 23.3 per hour. CPAP was titrated to 15 CWP, AHI 0 per hour. A medium  ResMed Quattro full face mask was used with heated humidifier.   OXYGEN DATA:  Moderate to loud snoring with oxygen desaturation to a nadir  of 89%. Mean oxygen saturation after CPAP titration was 93% on room air.   CARDIAC DATA:  Sinus rhythm with PACs.   MOVEMENT-PARASOMNIA:  Rare leg jerk, insignificant.   IMPRESSIONS-RECOMMENDATIONS:  1. Moderate obstructive sleep apnea/hypopnea syndrome, apnea-hypopnea      index 25.1 per hour with positional events recorded only while supine.      Moderate to loud snoring with oxygen desaturation to a nadir of 89%.  2. Successful continuous positive airway pressure titration to 15  CWP,      apnea-hypopnea index 0 per hour. A medium ResMed Quattro full face mask      was used with a heated humidifier.      Clinton D. Maple Hudson, MD, FCCP, FACP  Diplomate, Biomedical engineer of Sleep Medicine  Electronically Signed     CDY/MEDQ  D:  06/10/2006 16:54:16  T:  06/10/2006 17:11:19  Job:  [id number]

## 2010-12-02 NOTE — H&P (Signed)
NAME:  Willie Andrews, Willie Andrews NO.:  [id number]   MEDICAL RECORD NO.:  [id number]          PATIENT TYPE:  INP   LOCATION:  0102                         [facility]ACILITY:  [facility]   PHYSICIAN:  Deirdre Peer. Polite, M.D. DATE OF BIRTH:  03-Oct-1955   DATE OF ADMISSION:  01/30/2005  DATE OF DISCHARGE:                                HISTORY & PHYSICAL   CHIEF COMPLAINT:  Abdominal pain, diarrhea/bloody.   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  Also with a  history of kidney stones.  The patient was in his usual state of health  until last Wednesday when he was seen in [place] for complaints of  abdominal pain on the right and left side.  At that time, the patient stated  that he was evaluated.  CT scan told that he had a stone and he was asked to  see his primary M.D.  Because of the patient's history of stones, he states  he went through the urology center.  At that time when he was evaluated, he  had an ultrasound which confirmed stone.  The patient potentially had plans  of outpatient followup with the urologist for possible extraction of the  stone according to his report.  On Friday, the patient had complaints of  abdominal pain, constipation, which later evolved into diarrhea greater than  10-20 times.  The patient had associated sweats, fever, and nausea.  The  patient presented to the ED for the above complaints.  In the ED, the  patient was evaluated and had a CT which showed inflammatory changes in the  distal transverse colon, splenic flexure, and descending colon.  The patient  was advised to be admitted to the hospital.  However, the patient did not  stay.  In consulting with the patient, the patient states that it was not  impressed upon him how sick he was.  The patient was treated with Cipro and  oxycodone.  The patient went home that day, continued to have diarrhea, at  that time turned bloody.  His symptoms continued off and on throughout  Sunday with increasing pain.  The patient returns to the ED for evaluation.   In the ED, the patient was evaluated and still complains of abdominal pain.  Vitals:  BP stable, pulse 120, respiratory rate 22.  Labs are significant  for leukocytosis of 26,000.  Admission is deemed necessary for further  evaluation and treatment.  Therefore, [facility] has been called for  evaluation.   PAST MEDICAL HISTORY:  As stated above.   MEDICATIONS ON ADMISSION:  1.  Chronic ibuprofen on an outpatient basis, approximately 4-6 tabs a day      for chronic pain related to neck and back.  2.  P.r.n. Valium 1/2 5-mg as needed.  3.  Oxycodone and Cipro which were recently prescribed.   SOCIAL HISTORY:  Positive for tobacco one pack per day.  No alcohol.  No  drugs.   PAST SURGICAL HISTORY:  1.  Prior  neck surgery.  2.  Two lower back surgeries.  3.  The patient denies appendectomy or cholecystectomy.   ALLERGIES:  BUPRENEX which causes hives.   FAMILY HISTORY:  Mother deceased secondary to presumed lung CA.  Other  family members essentially healthy.   REVIEW OF SYSTEMS:  As stated in the HPI, otherwise negative.   PHYSICAL EXAMINATION:  GENERAL:  The patient is alert and oriented x3,  somewhat stoic demeanor but does complain of abdominal pain.  HEENT:  Within normal limits.  CHEST:  Clear to auscultation except for a few basilar crackles in the left  lower lobe.  CARDIOVASCULAR:  Regular.  No S3.  ABDOMEN:  Soft, positive bowel sounds.  Diffusely tender to palpation  potentially in the left lower quadrant.  EXTREMITIES:  No clubbing, cyanosis, or edema.  NEUROLOGIC:  Nonfocal.   LABORATORY DATA:  CT from the 14th, findings as stated above.  Inflammatory  changes in the distal transverse colon, splenic flexure, and descending  colon consistent with colitis.  Differential includes ischemic versus  infectious versus  inflammatory.  BMET:  Sodium 132, potassium 3.6, chloride  94, carbon dioxide 27, BUN 14, creatinine 1.0.  AST and ALT within normal  limits.  Bilirubin 0.8.  Total protein 6.8, amylase 42, lipase 20.  CBC:  White count 26.6, hemoglobin 15, hematocrit 44.2, platelets 184.  UA:  Specific gravity 1.036, LE and nitrite negative.  Urine with white blood  cells 0-2, urine epithelial cells rare, urine red blood cells 0-2.   ASSESSMENT:  1.  Abdominal pain, most likely secondary to CT findings.  2.  Bloody diarrhea.  3.  Kidney stone at the left ureterovesical junction, approximately 2 mm      from the last CT on 06/13.  4.  History of chronic pain related to the back and neck.   RECOMMENDATION:  The patient be admitted to the medical floor for evaluation  and treatment of colitis.  The patient will require aggressive IV fluids, IV  antibiotics.  The patient will be pan cultured.  The patient will require a  GI consultation for endoscopy.  I will make further recommendations after  reviewing the above studies.       RDP/MEDQ  D:  01/30/2005  T:  01/30/2005  Job:  [id number]   cc:   Dr. Enis Slipper, [facility]

## 2010-12-02 NOTE — Consult Note (Signed)
NAME:  Willie Andrews, Willie Andrews                ACCOUNT NO.:  [id number]   MEDICAL RECORD NO.:  [id number]          PATIENT TYPE:  INP   LOCATION:  1431                         FACILITY:  [facility]   PHYSICIAN:  Lucrezia Starch. Earlene Plater, M.D.  DATE OF BIRTH:  02-28-1956   DATE OF CONSULTATION:  01/31/2005  DATE OF DISCHARGE:                                   CONSULTATION   REASON FOR CONSULTATION:  The patient was scheduled for urologic surgery on  February 01, 2005, for ureteroscopy and bladder stone manipulation.   HISTORY OF PRESENT ILLNESS:  The patient presented to our office on  Thursday, January 26, 2005, with increasing abdominal pain and difficulty  urinating.  He had previously been in the ER the evening before and was told  to have a kidney stone.  He brings results of that CT scan with him and was  shown to have 2 mm bladder stone at the level of the UVJ.  A bladder  ultrasound was performed in our office due to the fact that he was having  difficulty urinating and he was found not to be in retention and was also  found to have an 8 mm stone in the bladder.  He was then subsequently  scheduled for cystoscopy and bladder stone removal.  He was also placed on  an anticholinergic to assist with symptoms of urgency and frequency.  He has  subsequently, after leaving our office, returned to the emergency room with  complaints of abdominal pain, constipation and diarrhea associated with  sweats, fever and nausea.  He was found to have by CT scan inflammatory  changes in the distal transverse colon, splenic flexure and descending  colon.  He was also found to have an elevated white count.  The patient  tells me that he was advised to stay and be admitted, however, upon  returning home his symptoms remained.  His diarrhea turned bloody and then  returned to the ER and was admitted.   PAST MEDICAL HISTORY:  1.  Kidney stones.  2.  Back surgery.  3.  Cervical pain.   ALLERGIES:  BUPRENEX.   MEDICATIONS:   Ibuprofen, Valium, oxycodone and Cipro.   PAST SURGICAL HISTORY:  1.  Neck surgery.  2.  Low back surgery.   SOCIAL HISTORY:  No ETOH use.  Does smoke one pack per day of cigarettes.   REVIEW OF SYSTEMS:  As stated in the HPI and are otherwise negative.   PHYSICAL EXAMINATION:  GENERAL:  A tired, 55 year old, white male in no  acute distress.  VITAL SIGNS:  Temperature max 99.8, otherwise stable.  HEENT:  Atraumatic, normocephalic.  NECK:  Trachea midline.  Negative JVD.  CHEST:  Normal diaphragmatic motion.  ABDOMEN:  Soft with diffuse tenderness.  GENITALIA:  Catheter intact draining clear yellow urine.   LABORATORY DATA AND X-RAY FINDINGS:  CT scan of abdomen and pelvis without  contrast on July 14, shows apparent edema of distal transverse colon,  splenic flexure and descending colon.  No hydronephrosis.  Small calculus  noted previously in the region of the bladder  of the left UVJ unchanged from  previous CT on January 26, 2005, which was shown to be about 2 mm.  CT scan of  pelvis and abdomen with contrast on July 17, shows findings consistent with  colitis and increased free air within the pelvis.  No mention of stones or  other urological abnormalities.   CBC on July 18, shows a white count of 15.0, hemoglobin 13.3, hematocrit  37.8 and platelet count of 175.  Basic metabolic shows sodium of 132,  potassium 3.3, chloride 97, glucose 115, BUN 8 and creatinine 0.9, calcium  7.9.   The patient underwent flexible sigmoidoscopy with biopsy by Dr. Ewing Schlein.   IMPRESSION:  An 8 mm bladder stone by ultrasound on January 26, 2005, with  computed tomography showing questionable 2 mm bladder stone on January 27, 2005.   RECOMMENDATIONS:  The patient originally scheduled for cystoscopy with  bladder stone removal, however, that surgery will be canceled.  Once the  patient is discharged, we will have the patient follow up in office for in-  office cystoscopy and further stone evaluation.   Will reschedule surgery at  that time if needed.       JML/MEDQ  D:  01/31/2005  T:  01/31/2005  Job:  [id number]

## 2010-12-02 NOTE — Discharge Summary (Signed)
NAME:  Willie Andrews, Willie Andrews NO.:  [id number]   MEDICAL RECORD NO.:  [id number]          PATIENT TYPE:  INP   LOCATION:  1431                         FACILITY:  [facility]   PHYSICIAN:  Hollice Espy, M.D.DATE OF BIRTH:  [birth year]   DATE OF ADMISSION:  01/30/2005  DATE OF DISCHARGE:  02/02/2005                                 DISCHARGE SUMMARY   PRIMARY CARE PHYSICIAN:  L. Lupe Carney, M.D.,  [facility].   CONSULTATIONS:  1.  Petra Kuba, M.D., [facility].  2.  Lucrezia Starch. Earlene Plater, M.D., urology.   DISCHARGE DIAGNOSES:  1.  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  2.  Nonobstructing renal stone 2 mm at the left ureterovesical junction.  3.  Chronic back and neck pain.   DISCHARGE MEDICATIONS:  1.  Cipro 500 mg, 1 p.o. b.i.d. x7 days.  2.  Flagyl 500 mg, 1 p.o. t.i.d. x7 days.  3.  OxyContin 10 mg, 1 p.o. q.6 h p.r.n., total number 10.  4.  Toradol 10 mg p.o. q.6 h p.r.n., total number 15. The patient is advised      not to take his chronic ibuprofen while on Toradol.   HOSPITAL COURSE:  The patient is a 55 year old white male with a past  medical history of chronic lower back and cervical disease plus kidney  stones who presented with severe abdominal pain in the right and left side.  CT scan showed a stone and the patient was sent to the Urology Center which  an ultrasound did confirm he had a stone there. It showed no evidence of  hydronephrosis or obstruction and the plan was for an outpatient cystoscopy  with stone removal. However then on Friday, the patient started having  abdominal pain, constipation, diarrhea x10-20 times and came into the  emergency room for further evaluation and admission. At that time, he was  noted to have a white count of 26,000. CT scan showed  inflammatory changes  in the distal transverse colon, splenic flexure and descending colon  consistent with colitis. The patient was admitted. Urology was  consulted for  evaluation and they felt that given the patient's acute colitis issues, the  original plan for an outpatient urologic stone extraction on July 19 was  then held off and plan was for patient to followup as an outpatient with his  stone. In regards to his colitis, GI saw the patient on same day of  admission. They felt that he likely had ischemic colitis given the pattern  and time onset. He had a benign abdomen and no evidence of any systemic  toxicity. The patient was felt to be on conservative management for IV  fluids and antibiotics. A flexible sigmoidoscopy was performed on the  patient on July 18 which agreed and showed the same ischemic colitis. A  biopsy was done with pathology still pending. The patient was initially  started on Cipro and Flagyl, this was continued. The plan will be for him to  be discharged on February 02, 2005 with seven more days of each.  As well as a  followup in 1-2 months for a colonoscopy. In addition, the patient following  flex sig on July 19, one day later, was complaining of some mild abdominal  discomfort and was unable to tolerate p.o.  However, this soon passed. His  bowels which initially were hyperactive by July 19 had come  down to normal. He was tolerating a regular diet and the patient was felt to  be medically stable for discharge on February 02, 2005. His overall disposition  has improved. His activity is as tolerated. He is being discharged to home  on a regular diet.       SKK/MEDQ  D:  02/02/2005  T:  02/03/2005  Job:  [id number]   cc:   Bernette Redbird, M.D.  [address] [place]  [place], [place] [address]  Fax: [phone]   L. Lupe Carney, M.D.  [address] [place]  [place] [address]  Fax: [phone]   Lucrezia Starch. Earlene Plater, M.D.  [address]  [place]  [place] [address]  Fax: [phone]

## 2010-12-02 NOTE — Op Note (Signed)
NAME:  Willie Andrews, Willie Andrews NO.:  [id number]   MEDICAL RECORD NO.:  [id number]          PATIENT TYPE:  OBV   LOCATION:  2550                         FACILITY:  [facility]   PHYSICIAN:  Suzanna Obey, M.D.       DATE OF BIRTH:  Dec 20, 1955   DATE OF PROCEDURE:  09/12/2006  DATE OF DISCHARGE:                               OPERATIVE REPORT   PREOPERATIVE DIAGNOSIS:  Deviated septum and turbinate hypertrophy.   POSTOPERATIVE DIAGNOSIS:  Deviated septum and turbinate hypertrophy.   SURGICAL PROCEDURE:  Septoplasty and submucous resection of inferior  turbinates bilaterally.   ANESTHESIA:  General.   ESTIMATED BLOOD LOSS:  Less than 10 mL.   INDICATIONS:  This is a 55 year old who has had an obstructive sleep  apnea and now has nasal obstruction which has been refractory to medical  therapy.  He has not been able to tolerate the CPAP secondary to the  nasal obstruction.  We talked about options of surgical procedures to  his palate and tonsil area versus just his nose repair.  He would prefer  to try just the nasal repair to get a better nasal airway and not  perform the oral surgery.  He was informed of the risks and benefits of  the procedure including bleeding, infection, septal perforation, change  in external appearance of the nose, chronic crusting and drying,  numbness of the teeth and risk of the anesthetic.  All questions are  answered and consent was obtained.  He understands that he will need to  still wear the C-PAP after his nose is healed to continue treatment for  his sleep apnea.   OPERATION:  The patient was taken to the operating room and placed in  the supine position after adequate general endotracheal tube anesthesia  in the supine neutral neck position.  He was draped in the usual sterile  manner.  The septum and inferior turbinates were injected with 1%  lidocaine with 1:100,000 epinephrine.  A right hemi transfixion incision  was performed raising the  mucoperichondrial and ostial flap.  The  cartilage was divided about 2 cm posterior to the caudal strut and this  cartilage was removed.  The deviated portion of the bone was removed  with the Jantzen-Middleton forceps after elevating the opposite flap.  An inferior spur was removed with a 4 mm osteotome.  This was quite  large.  This corrected the septal deflection.  The hemi transfixion  incision was closed with interrupted 4-0 chromic and a 4-0 plain gut  quilting stitch placed through the septum.  The turbinates were then in-  fractured and a midline incision made with a #15 blade.  A mucosal flap  elevated superiorly with the free elevator.  The inferior mucosa and  bone were removed with the turbinate scissors.  The edge was cauterized  with suction cautery and both flaps were laid back down over the raw  surface and out-fractured.  The nasopharynx was suctioned out of all  blood and debris.  Telfa roll soaked in Bacitracin were placed into the  nose bilaterally and secured  with a 3-0 nylon.  The oral cavity and  oropharynx were suctioned out of all blood and debris under direct  visualization.  The patient was awakened and brought to recovery in  stable condition.  Counts correct.          ______________________________  Suzanna Obey, M.D.    JB/MEDQ  D:  09/12/2006  T:  09/12/2006  Job:  [id number]
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
## 2010-12-02 NOTE — Consult Note (Signed)
NAME:  Willie Andrews, Willie Andrews NO.:  [id number]   MEDICAL RECORD NO.:  [id number]          PATIENT TYPE:  INP   LOCATION:  1431                         [facility]ACILITY:  [facility]   PHYSICIAN:  Bernette Redbird, M.D.   DATE OF BIRTH:  [birth year]   DATE OF CONSULTATION:  01/30/2005  DATE OF DISCHARGE:                                   CONSULTATION   REASON FOR CONSULTATION:  Dr. Trula Slade [facility] asked me  to see this 55 year old male because of hemorrhagic colitis.   Mr. Heber has enjoyed good health from the GI tract standpoint in the past.  About 5 days ago, he had bilateral lower flank burning pain, apparently due  to a recurrence of a kidney stone, and he was seen at the urology center the  next day where an 8 mm stone was identified. That evening, he developed a  sense of constipation and crampy abdominal pain for which he was seen at the  [facility] emergency room and admission was advised when he was noted to  have a high white count. Nonetheless, he went home and that evening  developed diarrhea with dark and bright red blood. This occurred with gut-  wrenching cramps on and off during the night and he felt feverish. He  continued to have symptoms over the course the weekend, but when he phoned  his primary physician's office today, he was directed to the emergency room  where admission was arranged. A CT scan shows evidence of colitis from the  distal transverse colon to the descending colon.   PAST MEDICAL HISTORY:  Allergy to [facility].   Outpatient medications:  Valium and ibuprofen, the latter typically six  tablets daily.   Operations:  Multiple orthopedic procedures including surgery on his low  back his right shoulder his neck and his elbow.   Medical illnesses:  History of kidney stones. No known cardiopulmonary  disease, diabetes or hypertension.   Habits:  Smokes one pack per day. Previously drank alcohol but has not done  for some  years.   FAMILY HISTORY:  From review of chart, appears negative for GI illnesses.   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   REVIEW OF SYSTEMS:  The patient uses Tums each night and belches a fair  amount but does not really have chronic heartburn. No anorexia, weight loss,  dysphagia, chronic abdominal pain. There is a slight tendency toward  constipation with a bowel movement roughly every-other day, sometimes every  third day, occasionally on a daily basis. No prior history of GI bleeding as  he currently has.   PHYSICAL EXAMINATION:  GENERAL:  The patient is in no acute distress.  VITAL SIGNS:  On presentation to the hospital, his temperature was 98.7,  pulse 127, blood pressure 140/91.  HEENT:  The patient is anicteric and without evident pallor.  CHEST:  Clear.  HEART:  Without gallops, rubs or clicks or arrhythmias.  ABDOMEN:  Has scattered bowel sounds which are somewhat hollow and  nonobstructive in character. There is diffuse tenderness but it is fairly  mild, without significant guarding, just a little bit of voluntary guarding,  no rigidity, no peritoneal findings.  RECTAL:  Exam not performed at this time in anticipation of him having  colonoscopy tomorrow and having passed blood earlier.   LABORATORY:  White count elevated at 26,600 with 88% polys and 6% lymphs;  hemoglobin 15.5. Chemistry panel pertinent for normal BUN and creatinine,  albumin of 3.4, normal liver chemistries.  CT scan:  See above report,  suggestive of segmental colitis in the region of the splenic flexure (that  CT was performed 3 days ago).   IMPRESSION:  The overall picture with the abrupt onset of abdominal pain and  diarrhea which has become bloody is suggestive of ischemic colitis, and this  segmental distribution of colitis on the CT would favor that as  well. The  patient has significant leukocytosis but a fairly benign abdomen to exam and  no evidence of systemic toxicity. Therefore, I think conservative management  with IV fluids and antibiotic support is appropriate. A colonoscopy or  partial colonoscopy will be arranged for tomorrow (nature, purpose and risks  reviewed with the patient and his girlfriend, Dawn, who accompanies him).   Secondarily, the patient is at some risk for ulcer disease with his daily  ibuprofen use and he has been having some dyspeptic symptomatology.  Consideration might be given to concurrent endoscopy and I have discussed  that briefly with Dr. Ewing Schlein who will in all likelihood just start by doing  the lower tract evaluation.       RB/MEDQ  D:  01/30/2005  T:  01/31/2005  Job:  [id number]   cc:   L. Lupe Carney, M.D.  [address] [place]  [place] [address]  Fax: [phone]   Lucrezia Starch. Earlene Plater, M.D.  [address]  [place]  [place] [address]  Fax: [phone]

## 2011-01-23 ENCOUNTER — Other Ambulatory Visit: Payer: Self-pay | Admitting: Family Medicine

## 2011-01-23 MED ORDER — DULOXETINE HCL 60 MG PO CPEP: 60.0000 mg | ORAL_CAPSULE | Freq: Every day | ORAL | Status: DC

## 2011-01-23 NOTE — Telephone Encounter (Signed)
Last seen 11/08/10 and given samples oc Cymbalta 60 mg, patient wants an Rx....   Please advise    KP

## 2011-03-10 ENCOUNTER — Encounter: Payer: Self-pay | Admitting: Family Medicine

## 2011-03-10 ENCOUNTER — Ambulatory Visit (INDEPENDENT_AMBULATORY_CARE_PROVIDER_SITE_OTHER): Payer: BC Managed Care – PPO | Admitting: Family Medicine

## 2011-03-10 VITALS — BP 128/68 | HR 86 | Temp 99.1°F | Wt 182.0 lb

## 2011-03-10 DIAGNOSIS — M48061 Spinal stenosis, lumbar region without neurogenic claudication: Secondary | ICD-10-CM

## 2011-03-10 DIAGNOSIS — I10 Essential (primary) hypertension: Secondary | ICD-10-CM

## 2011-03-10 DIAGNOSIS — M545 Low back pain, unspecified: Secondary | ICD-10-CM

## 2011-03-10 DIAGNOSIS — E785 Hyperlipidemia, unspecified: Secondary | ICD-10-CM

## 2011-03-10 DIAGNOSIS — R252 Cramp and spasm: Secondary | ICD-10-CM

## 2011-03-10 DIAGNOSIS — IMO0002 Reserved for concepts with insufficient information to code with codable children: Secondary | ICD-10-CM | POA: Insufficient documentation

## 2011-03-10 DIAGNOSIS — L723 Sebaceous cyst: Secondary | ICD-10-CM

## 2011-03-10 MED ORDER — COENZYME Q10 400 MG PO CAPS: ORAL_CAPSULE | ORAL | Status: DC

## 2011-03-10 NOTE — Progress Notes (Signed)
  Subjective:    Patient ID: Willie Andrews, male    DOB: May 30, 1956, 55 y.o.   MRN: [id number]  HPI Pt here f/u labs and leg cramps.  Pt saw pain management and they did not know what it was.  Labs here were normal and stopping crestor did not make it go away.  Pt to have spinal stimulator put in soon.    No other complaints.    Review of Systems As above    Objective:   Physical Exam  Constitutional: He is oriented to person, place, and time. He appears well-developed and well-nourished.  Neck: Normal range of motion. Neck supple.  Cardiovascular: Normal rate, regular rhythm, normal heart sounds and intact distal pulses.   Pulmonary/Chest: Effort normal and breath sounds normal. No respiratory distress.  Musculoskeletal: He exhibits no edema and no tenderness.  Neurological: He is alert and oriented to person, place, and time.  Skin:       + seb cyst R shoulder---small--causing pt pain  Psychiatric: He has a normal mood and affect. His behavior is normal. Judgment and thought content normal.          Assessment & Plan:

## 2011-03-10 NOTE — Assessment & Plan Note (Signed)
con't meds  Check labs 

## 2011-03-10 NOTE — Assessment & Plan Note (Signed)
Per pain management.  

## 2011-03-10 NOTE — Assessment & Plan Note (Signed)
Arrange for I&D

## 2011-03-10 NOTE — Patient Instructions (Signed)

## 2011-03-10 NOTE — Assessment & Plan Note (Signed)
Maybe from back Pain management to try spinal stimulator Inc cymbalta to 120 mg daily

## 2011-03-10 NOTE — Assessment & Plan Note (Signed)
Cont meds Schedule labs

## 2011-04-07 ENCOUNTER — Encounter: Payer: Self-pay | Admitting: Family Medicine

## 2011-04-07 ENCOUNTER — Ambulatory Visit (INDEPENDENT_AMBULATORY_CARE_PROVIDER_SITE_OTHER): Payer: BC Managed Care – PPO | Admitting: Family Medicine

## 2011-04-07 DIAGNOSIS — B354 Tinea corporis: Secondary | ICD-10-CM

## 2011-04-07 DIAGNOSIS — M48061 Spinal stenosis, lumbar region without neurogenic claudication: Secondary | ICD-10-CM

## 2011-04-07 DIAGNOSIS — G4700 Insomnia, unspecified: Secondary | ICD-10-CM

## 2011-04-07 LAB — CBC
HCT: 38.7 — ABNORMAL LOW
Hemoglobin: 13.4
MCHC: 34.7
MCV: 86.5
Platelets: 159
RBC: 4.47
RDW: 13.4
WBC: 11.3 — ABNORMAL HIGH

## 2011-04-07 LAB — DIFFERENTIAL
Basophils Absolute: 0.1
Basophils Relative: 1
Eosinophils Absolute: 0.1
Eosinophils Relative: 1
Lymphocytes Relative: 24
Lymphs Abs: 2.7
Monocytes Absolute: 0.8
Monocytes Relative: 7
Neutro Abs: 7.7
Neutrophils Relative %: 68

## 2011-04-07 LAB — HEPATIC FUNCTION PANEL
ALT: 21
AST: 21
Albumin: 3.5
Alkaline Phosphatase: 69
Bilirubin, Direct: 0.1
Indirect Bilirubin: 0.5
Total Bilirubin: 0.6
Total Protein: 5.9 — ABNORMAL LOW

## 2011-04-07 LAB — URINALYSIS, ROUTINE W REFLEX MICROSCOPIC
Bilirubin Urine: NEGATIVE
Glucose, UA: NEGATIVE
Hgb urine dipstick: NEGATIVE
Ketones, ur: NEGATIVE
Nitrite: NEGATIVE
Protein, ur: NEGATIVE
Specific Gravity, Urine: 1.028
Urobilinogen, UA: 0.2
pH: 5.5

## 2011-04-07 LAB — BASIC METABOLIC PANEL WITH GFR
CO2: 26
Calcium: 8.7
GFR calc Af Amer: >60
GFR calc non Af Amer: >60
Glucose, Bld: 98
Potassium: 3.8
Sodium: 142

## 2011-04-07 LAB — URINE CULTURE
Colony Count: NO GROWTH
Culture: NO GROWTH

## 2011-04-07 LAB — BASIC METABOLIC PANEL
BUN: 11
Chloride: 108
Creatinine, Ser: 0.77

## 2011-04-07 LAB — LIPASE, BLOOD: Lipase: 35

## 2011-04-07 MED ORDER — ZOLPIDEM TARTRATE 10 MG PO TABS: 10.0000 mg | ORAL_TABLET | Freq: Every evening | ORAL | Status: AC | PRN

## 2011-04-07 MED ORDER — TERBINAFINE HCL 1 % EX CREA: TOPICAL_CREAM | Freq: Two times a day (BID) | CUTANEOUS | Status: DC

## 2011-04-07 NOTE — Patient Instructions (Signed)

## 2011-04-07 NOTE — Progress Notes (Signed)
  Subjective:    Patient ID: Willie Andrews, male    DOB: 1956/07/02, 55 y.o.   MRN: [id number]  HPI Pt is here because he is having trouble staying asleep.  He falls asleep but wakes up 2-3 hours later and cannot go back to sleep.  Pt is seeing [facility] for [facility].  Pt does not snore and is not restless per him.  He has been having leg cramps as well and nothing they have tried has helped.  Cramps in legs are all day and night.     Review of Systems    as above Objective:   Physical Exam  Constitutional: He appears well-developed and well-nourished.  Psychiatric: He has a normal mood and affect. His behavior is normal. Judgment and thought content normal.          Assessment & Plan:  A/p  Insomnia---secondary to leg cramps --pt waiting for spinal stimulator                      Short term sleeping aid

## 2011-04-11 LAB — BASIC METABOLIC PANEL
BUN: 13
CO2: 25
CO2: 28
Calcium: 8.4
Calcium: 9
Creatinine, Ser: 0.92
Glucose, Bld: 102 — ABNORMAL HIGH
Glucose, Bld: 79
Potassium: 3.6
Sodium: 143

## 2011-04-11 LAB — CULTURE, BLOOD (ROUTINE X 2)
Culture: NO GROWTH
Report Status: 4262009

## 2011-04-11 LAB — SALICYLATE LEVEL: Salicylate Lvl: <4

## 2011-04-11 LAB — BLOOD GAS, ARTERIAL
Bicarbonate: 24.7 — ABNORMAL HIGH
O2 Content: 2
O2 Saturation: 98
Patient temperature: 37

## 2011-04-11 LAB — CBC
HCT: 37.5 — ABNORMAL LOW
Hemoglobin: 13.3
MCHC: 35.4
MCHC: 35.5
Platelets: 167
RDW: 13
RDW: 13.3

## 2011-04-11 LAB — DIFFERENTIAL
Basophils Absolute: 0
Basophils Absolute: 0
Basophils Relative: 0
Basophils Relative: 0
Eosinophils Relative: 2
Monocytes Absolute: 0.8
Monocytes Absolute: 0.8
Neutro Abs: 6.7
Neutrophils Relative %: 68

## 2011-04-11 LAB — URINE CULTURE: Special Requests: NEGATIVE

## 2011-04-11 LAB — ACETAMINOPHEN LEVEL: Acetaminophen (Tylenol), Serum: <10 — ABNORMAL LOW

## 2011-04-11 LAB — RAPID URINE DRUG SCREEN, HOSP PERFORMED
Amphetamines: NOT DETECTED
Cocaine: NOT DETECTED
Opiates: NOT DETECTED
Tetrahydrocannabinol: NOT DETECTED

## 2011-04-11 LAB — URINALYSIS, ROUTINE W REFLEX MICROSCOPIC
Nitrite: NEGATIVE
Specific Gravity, Urine: >1.03 — ABNORMAL HIGH
Urobilinogen, UA: 0.2

## 2011-04-11 LAB — HEPATIC FUNCTION PANEL
Bilirubin, Direct: 0.1
Indirect Bilirubin: 0.7

## 2011-04-11 LAB — URINE MICROSCOPIC-ADD ON

## 2011-04-13 LAB — CBC
Hemoglobin: 13.6
MCHC: 36
MCHC: 34.5
RBC: 4.26
RBC: 3.26 — ABNORMAL LOW
RDW: 13.2
WBC: 18.1 — ABNORMAL HIGH

## 2011-04-13 LAB — BASIC METABOLIC PANEL
BUN: 12
CO2: 25
CO2: 31
Calcium: 8.4
Chloride: 111
Creatinine, Ser: 0.66
GFR calc Af Amer: >60
GFR calc non Af Amer: >60
Glucose, Bld: 122 — ABNORMAL HIGH
Potassium: 4.1

## 2011-04-13 LAB — POCT I-STAT, CHEM 8
BUN: 15
Calcium, Ion: 1.17
Hemoglobin: 13.3
Sodium: 142
TCO2: 24

## 2011-04-13 LAB — DIFFERENTIAL
Basophils Relative: 0
Lymphs Abs: 1.3
Monocytes Absolute: 0.5
Monocytes Relative: 3
Neutro Abs: 16.2 — ABNORMAL HIGH

## 2011-04-14 ENCOUNTER — Ambulatory Visit (INDEPENDENT_AMBULATORY_CARE_PROVIDER_SITE_OTHER): Payer: BC Managed Care – PPO | Admitting: Family Medicine

## 2011-04-14 ENCOUNTER — Encounter: Payer: Self-pay | Admitting: Family Medicine

## 2011-04-14 VITALS — BP 132/80 | HR 71 | Temp 97.2°F | Wt 183.0 lb

## 2011-04-14 DIAGNOSIS — L723 Sebaceous cyst: Secondary | ICD-10-CM

## 2011-04-14 DIAGNOSIS — E785 Hyperlipidemia, unspecified: Secondary | ICD-10-CM

## 2011-04-14 LAB — HEPATIC FUNCTION PANEL
AST: 26 U/L (ref 0–37)
Albumin: 3.9 g/dL (ref 3.5–5.2)
Alkaline Phosphatase: 74 U/L (ref 39–117)
Total Protein: 6.8 g/dL (ref 6.0–8.3)

## 2011-04-14 LAB — BASIC METABOLIC PANEL
Calcium: 9.2 mg/dL (ref 8.4–10.5)
Creatinine, Ser: 0.9 mg/dL (ref 0.4–1.5)

## 2011-04-14 LAB — LIPID PANEL
Cholesterol: 188 mg/dL (ref 0–200)
HDL: 59.2 mg/dL (ref >39.00)
LDL Cholesterol: 113 mg/dL — ABNORMAL HIGH (ref 0–99)
Triglycerides: 81 mg/dL (ref 0.0–149.0)

## 2011-04-14 MED ORDER — CEPHALEXIN 500 MG PO CAPS: 500.0000 mg | ORAL_CAPSULE | Freq: Two times a day (BID) | ORAL | Status: AC

## 2011-04-14 NOTE — Progress Notes (Signed)
Addended by: Arnette Norris on: 04/14/2011 03:56 PM   Modules accepted: Orders

## 2011-04-14 NOTE — Progress Notes (Signed)
  Subjective:    Patient ID: Willie Andrews, male    DOB: February 23, 1956, 55 y.o.   MRN: [id number]  HPI Pt here for cyst removal on his upper back.  He states it has been there for several years and has just gotten bigger and more irritating.    Review of Systems As above    Objective:   Physical Exam  Constitutional: He appears well-developed and well-nourished.  Skin:     Psychiatric: He has a normal mood and affect. His behavior is normal. Judgment normal.          Assessment & Plan:  Cyst removal--- rto 5-7 days for suture removal                           Or sooner prn

## 2011-04-14 NOTE — Patient Instructions (Signed)
Cyst Removal Your caregiver has removed a cyst. A cyst is a sac containing a semi-solid material. They may occur any place on your body. Cysts may remain small for years or gradually get larger. A sebaceous cyst is a dilated (get larger) sweat gland filled with old sweat (sebum). Unattended, these may become large (the size of a softball) over several years time. These are often removed for improved appearance(cosmetic) reasons and/or before they become infected to form an abscess. An abscess is an infected cyst. HOME CARE INSTRUCTIONS  Keep your bandage clean and dry. You may change your bandage after 24 hours. If your bandage sticks, use warm water to gently loosen it. Pat the area dry with a clean towel before putting on another bandage.   If possible, keep the area where the cyst was removed raised to relieve soreness, swelling and promote healing.   If you have stitches, keep them clean and dry.   You may clean your stitches gently with a cotton swab dipped in a mixture of water and hydrogen peroxide in equal amounts, or warm soapy water.   Do not soak the area where the cyst was removed or go swimming. You may shower.   Do not overuse the area where your cyst was removed.   Return in 7 days to have your stitches removed.   Take medications as instructed by your caregiver.  SEEK IMMEDIATE MEDICAL CARE IF:  An oral temperature above 100.4 develops, not controlled by medication.   Blood continues to soak through the bandage.   You have increasing pain in the area where your cyst was removed.   You have redness, swelling, pus, a bad smell,soreness (inflammation) or red streaks coming away from the stitches. These are signs of infection.  MAKE SURE YOU:   Understand these instructions.   Will watch your condition.   Will get help right away if you are not doing well or get worse.  Document Released: 06/30/2000 Document Re-Released: 09/27/2009 [facility] Patient Information 2011  [place], [place].

## 2011-04-20 ENCOUNTER — Encounter: Payer: Self-pay | Admitting: Family Medicine

## 2011-04-21 ENCOUNTER — Telehealth: Payer: Self-pay

## 2011-04-21 ENCOUNTER — Encounter: Payer: Self-pay | Admitting: Family Medicine

## 2011-04-21 ENCOUNTER — Ambulatory Visit (INDEPENDENT_AMBULATORY_CARE_PROVIDER_SITE_OTHER)
Admission: RE | Admit: 2011-04-21 | Discharge: 2011-04-21 | Disposition: A | Discharge status: Home / Self Care | Payer: BC Managed Care – PPO | Source: Ambulatory Visit | Attending: Family Medicine | Admitting: Family Medicine

## 2011-04-21 ENCOUNTER — Ambulatory Visit (INDEPENDENT_AMBULATORY_CARE_PROVIDER_SITE_OTHER): Payer: BC Managed Care – PPO | Admitting: Family Medicine

## 2011-04-21 VITALS — BP 142/90 | HR 84 | Temp 97.1°F | Wt 183.0 lb

## 2011-04-21 DIAGNOSIS — M25519 Pain in unspecified shoulder: Secondary | ICD-10-CM

## 2011-04-21 DIAGNOSIS — N529 Male erectile dysfunction, unspecified: Secondary | ICD-10-CM

## 2011-04-21 DIAGNOSIS — M25511 Pain in right shoulder: Secondary | ICD-10-CM

## 2011-04-21 MED ORDER — SILDENAFIL CITRATE 100 MG PO TABS: 100.0000 mg | ORAL_TABLET | ORAL | Status: DC | PRN

## 2011-04-21 NOTE — Progress Notes (Signed)
Addended by: Lelon Perla on: 04/21/2011 08:50 AM   Modules accepted: Orders

## 2011-04-21 NOTE — Telephone Encounter (Signed)
Message copied by Arnette Norris on Fri Apr 21, 2011  5:36 PM ------      Message from: Lelon Perla      Created: Fri Apr 21, 2011  4:58 PM       Abnormality on xray correlates where pain is but they are unable to tell what it is----- pt needs ortho referral---can go to after hours clinic at [facility] but he would need his xray----otherwise we will make appoint for Monday.

## 2011-04-21 NOTE — Patient Instructions (Signed)
Shoulder Pain You were seen today with shoulder pain. The shoulder is a ball and socket joint. The muscles and tendons (rotator cuff) are what keep the shoulder in its joint and stable. This collection of muscles and tendons holds in the head (ball) of the humerus (upper arm bone) in the fossa (cup) of the scapula (shoulder blade). Today no reason was found for your shoulder pain. Often pain in the shoulder may be treated conservatively with temporary immobilization. For example, holding the shoulder in one place using a sling for rest. Physical therapy may be needed if problems continue. HOME CARE INSTRUCTIONS  Apply ice to the sore area for 15 minutes 2-3 times per day for the first 2 days. Put the ice in a plastic bag. Place a towel between the bag of ice and your skin.   If you have or were given a shoulder sling and straps, do not remove  until you see a caregiver for a follow-up examination. If you need to remove it to shower or bathe, move your arm as little as possible.   You may want to sleep on several pillows at night to lessen swelling and pain.   Only take over-the-counter or prescription medicines for pain, discomfort, or fever as directed by your caregiver.   It is very important to keep any follow-up appointments in order to avoid any type of permanent shoulder disability or chronic pain problems.  SEEK MEDICAL CARE IF:  Pain in your shoulder increases or new pain develops in your arm, hand, or fingers.   Your hand or fingers are colder than your other hand.   You do not obtain pain relief with the medications or your pain becomes worse.  SEEK IMMEDIATE MEDICAL CARE IF:  Your arm, hand, or fingers are numb or tingling.   Your arm, hand, or fingers are swollen, painful, or turn white or blue.   You develop chest pain or shortness of breath.  MAKE SURE YOU:   Understand these instructions.   Will watch your condition.   Will get help right away if you are not doing well  or get worse.  Document Released: 04/12/2005 Document Re-Released: 09/29/2008 [employer] Patient Information 2011 [employer]. 

## 2011-04-21 NOTE — Telephone Encounter (Signed)
Discussed with patient and he voiced understanding. Ortho referral put in      KP

## 2011-04-21 NOTE — Progress Notes (Signed)
Addended by: Lelon Perla on: 04/21/2011 04:59 PM   Modules accepted: Orders

## 2011-04-21 NOTE — Progress Notes (Addendum)
  Subjective:    Patient ID: Willie Andrews, male    DOB: 1956/05/15, 55 y.o.   MRN: [id number]  HPI Pt here for suture removal but last night he reached down to pick up a penny jug and he heard and felt a pop in R shoulder.  He now complains about pain in that R shoulder and a lump.   Review of Systems As above    Objective:   Physical Exam  R arm ---dec rom secondary to pain and + abnormality in R shoulder                No swelling or errythema      Assessment & Plan:  r shoulder pain---sling, ice , rest,  Check xray                               Suspect dislocation--- will refer to ortho after xray  S/p cyst  Removal---- sutures removed x6,  Healing well

## 2011-04-28 ENCOUNTER — Telehealth: Payer: Self-pay

## 2011-04-28 NOTE — Telephone Encounter (Signed)
Patient called [facility] stating that he was referred to [facility] for ortho consult. Per pt, he has some unpaid bills/copays and request to be sent to a different facility. He does not wish to be seen at [facility] or [facility].

## 2011-05-03 NOTE — Telephone Encounter (Signed)
I spoke with patient, informed him due to outstanding balances, next step would be to refer him to [facility].  Patient agreed.  He has appt with [facility].

## 2011-06-19 ENCOUNTER — Telehealth: Payer: Self-pay | Admitting: Family Medicine

## 2011-06-19 NOTE — Telephone Encounter (Signed)
i don't see it on his med list.---if surgeon gave it to them --they should refill it.

## 2011-06-19 NOTE — Telephone Encounter (Signed)
Patient had surgery [phone] & [phone] on back - he was taking [place] 1 as needed  He said it isnt working as well since the surgery - he wants t know if it can be changed or dose increased - refused appt

## 2011-06-19 NOTE — Telephone Encounter (Signed)
Rx filled here 04/07/11 # 15 please advise     KP

## 2011-06-20 ENCOUNTER — Other Ambulatory Visit: Payer: Self-pay | Admitting: Family Medicine

## 2011-06-20 MED ORDER — TRAZODONE HCL 50 MG PO TABS: 50.0000 mg | ORAL_TABLET | Freq: Every evening | ORAL | Status: AC | PRN

## 2011-06-20 NOTE — Telephone Encounter (Signed)
Rx faxed-- and patient has been made aware. He stated he had a back surgery recently and he is only sleeping 3 hours at night and wanted something different to help him sleep. He said this would not be permanent, just something to help until he has completely healed.. Dr Laury Axon made aware     KP

## 2011-06-20 NOTE — Telephone Encounter (Signed)
No higher dose----trazadone 50 mg  1 po qhs prn  #15

## 2011-06-20 NOTE — Telephone Encounter (Signed)
[facility] [address]

## 2011-06-21 ENCOUNTER — Other Ambulatory Visit: Payer: Self-pay | Admitting: Family Medicine

## 2011-06-21 MED ORDER — LISINOPRIL 20 MG PO TABS: 20.0000 mg | ORAL_TABLET | Freq: Every day | ORAL | Status: DC

## 2011-06-21 NOTE — Telephone Encounter (Signed)
Faxed.   KP 

## 2011-07-18 HISTORY — PX: SPINAL CORD STIMULATOR IMPLANT: SHX2422

## 2011-10-06 ENCOUNTER — Ambulatory Visit (INDEPENDENT_AMBULATORY_CARE_PROVIDER_SITE_OTHER): Payer: BC Managed Care – PPO | Admitting: Family Medicine

## 2011-10-06 ENCOUNTER — Encounter: Payer: Self-pay | Admitting: Family Medicine

## 2011-10-06 VITALS — BP 140/76 | HR 101 | Temp 99.1°F | Wt 192.4 lb

## 2011-10-06 DIAGNOSIS — I10 Essential (primary) hypertension: Secondary | ICD-10-CM

## 2011-10-06 DIAGNOSIS — N529 Male erectile dysfunction, unspecified: Secondary | ICD-10-CM

## 2011-10-06 DIAGNOSIS — G4733 Obstructive sleep apnea (adult) (pediatric): Secondary | ICD-10-CM

## 2011-10-06 DIAGNOSIS — N400 Enlarged prostate without lower urinary tract symptoms: Secondary | ICD-10-CM

## 2011-10-06 DIAGNOSIS — G4700 Insomnia, unspecified: Secondary | ICD-10-CM

## 2011-10-06 DIAGNOSIS — E291 Testicular hypofunction: Secondary | ICD-10-CM

## 2011-10-06 DIAGNOSIS — E785 Hyperlipidemia, unspecified: Secondary | ICD-10-CM

## 2011-10-06 LAB — CBC WITH DIFFERENTIAL/PLATELET
Eosinophils Relative: 2.5 % (ref 0.0–5.0)
HCT: 50.3 % (ref 39.0–52.0)
Lymphs Abs: 2.4 10*3/uL (ref 0.7–4.0)
MCV: 89.3 fl (ref 78.0–100.0)
Monocytes Absolute: 0.7 10*3/uL (ref 0.1–1.0)
Platelets: 210 10*3/uL (ref 150.0–400.0)
WBC: 11.1 10*3/uL — ABNORMAL HIGH (ref 4.5–10.5)

## 2011-10-06 LAB — LIPID PANEL
HDL: 45.5 mg/dL (ref >39.00)
Triglycerides: 340 mg/dL — ABNORMAL HIGH (ref 0.0–149.0)

## 2011-10-06 LAB — BASIC METABOLIC PANEL
BUN: 19 mg/dL (ref 6–23)
CO2: 27 mEq/L (ref 19–32)
Calcium: 10.1 mg/dL (ref 8.4–10.5)
Creatinine, Ser: 0.9 mg/dL (ref 0.4–1.5)
Glucose, Bld: 119 mg/dL — ABNORMAL HIGH (ref 70–99)

## 2011-10-06 LAB — HEPATIC FUNCTION PANEL
Albumin: 4.6 g/dL (ref 3.5–5.2)
Alkaline Phosphatase: 85 U/L (ref 39–117)
Total Protein: 7.8 g/dL (ref 6.0–8.3)

## 2011-10-06 LAB — PSA: PSA: 1.69 ng/mL (ref 0.10–4.00)

## 2011-10-06 LAB — LDL CHOLESTEROL, DIRECT: Direct LDL: 125.5 mg/dL

## 2011-10-06 MED ORDER — TADALAFIL 5 MG PO TABS: 5.0000 mg | ORAL_TABLET | Freq: Every day | ORAL | Status: DC | PRN

## 2011-10-06 NOTE — Assessment & Plan Note (Signed)
Diagnosed at [place] many years ago Refer to pulm

## 2011-10-06 NOTE — Assessment & Plan Note (Signed)
With low T---- refer to urology

## 2011-10-06 NOTE — Progress Notes (Signed)
  Subjective:    Patient ID: Willie Andrews, male    DOB: 16-May-1956, 56 y.o.   MRN: [id number]  HPI Pt here to discuss low T---levels checked at pain management---levels below.  Pt also c/o insomnia but he was dx OSA  But stopped using cpap because he ran out of money.  Pain level is down to 4 from 10 /10 ---doing great with pain management.     Serum testosterone 174      Free testosterone--1.9       7.2-24.0 Review of Systems   as above Objective:   Physical Exam  Constitutional: He appears well-developed and well-nourished.  Cardiovascular: Normal rate.   No murmur heard. Pulmonary/Chest: Effort normal and breath sounds normal. No respiratory distress.  Psychiatric: His behavior is normal. Judgment and thought content normal.       Flat affect          Assessment & Plan:

## 2011-10-06 NOTE — Patient Instructions (Addendum)
Testosterone This test is used to determine if your testosterone level is abnormal. This could be used to explain difficulty getting an erection (erectile dysfunction), inability of your partner to get pregnant (infertility), premature or delayed puberty if you are male, or the appearance of masculine physical features if you are male. PREPARATION FOR TEST A blood sample is obtained by inserting a needle into a vein in the arm. NORMAL FINDINGS  Free Testosterone: 0.3-2 pg/mL   % Free Testosterone: 0.1%-0.3%  Total Testosterone:  7 mos-9 yrs (Tanner Stage I)   Male: Less than 30 ng/dL   Male: Less than 30 ng/dL   16-10 yrs (Tanner Stage II)   Male: Less than 300 ng/dL   Male: Less than 40 ng/dL   96-04 yrs (Tanner Stage III)   Male: 170-540 ng/dL   Male: Less than 60 ng/dL   54-09 yrs (Tanner Stage IV, V)   Male: 250-910 ng/dL   Male: Less than 70 ng/dL   20 yrs and over   Male: [phone] ng/dL   Male: Less than 70 ng/dL  Ranges for normal findings may vary among different laboratories and hospitals. You should always check with your doctor after having lab work or other tests done to discuss the meaning of your test results and whether your values are considered within normal limits. MEANING OF TEST  Your caregiver will go over the test results with you and discuss the importance and meaning of your results, as well as treatment options and the need for additional tests if necessary. OBTAINING THE TEST RESULTS It is your responsibility to obtain your test results. Ask the lab or department performing the test when and how you will get your results. Document Released: 07/20/2004 Document Revised: 06/22/2011 Document Reviewed: 06/14/2008 [facility] Patient Information 2012 [place], [place].  Insomnia Insomnia is frequent trouble falling and/or staying asleep. Insomnia can be a long term problem or a short term problem. Both are common. Insomnia can be a short  term problem when the wakefulness is related to a certain stress or worry. Long term insomnia is often related to ongoing stress during waking hours and/or poor sleeping habits. Overtime, sleep deprivation itself can make the problem worse. Every little thing feels more severe because you are overtired and your ability to cope is decreased. CAUSES   Stress, anxiety, and depression.   Poor sleeping habits.   Distractions such as TV in the bedroom.   Naps close to bedtime.   Engaging in emotionally charged conversations before bed.   Technical reading before sleep.   Alcohol and other sedatives. They may make the problem worse. They can hurt normal sleep patterns and normal dream activity.   Stimulants such as caffeine for several hours prior to bedtime.   Pain syndromes and shortness of breath can cause insomnia.   Exercise late at night.   Changing time zones may cause sleeping problems (jet lag).  It is sometimes helpful to have someone observe your sleeping patterns. They should look for periods of not breathing during the night (sleep apnea). They should also look to see how long those periods last. If you live alone or observers are uncertain, you can also be observed at a sleep clinic where your sleep patterns will be professionally monitored. Sleep apnea requires a checkup and treatment. Give your caregivers your medical history. Give your caregivers observations your family has made about your sleep.  SYMPTOMS   Not feeling rested in the morning.   Anxiety and restlessness at  bedtime.   Difficulty falling and staying asleep.  TREATMENT   Your caregiver may prescribe treatment for an underlying medical disorders. Your caregiver can give advice or help if you are using alcohol or other drugs for self-medication. Treatment of underlying problems will usually eliminate insomnia problems.   Medications can be prescribed for short time use. They are generally not recommended for  lengthy use.   Over-the-counter sleep medicines are not recommended for lengthy use. They can be habit forming.   You can promote easier sleeping by making lifestyle changes such as:   Using relaxation techniques that help with breathing and reduce muscle tension.   Exercising earlier in the day.   Changing your diet and the time of your last meal. No night time snacks.   Establish a regular time to go to bed.   Counseling can help with stressful problems and worry.   Soothing music and white noise may be helpful if there are background noises you cannot remove.   Stop tedious detailed work at least one hour before bedtime.  HOME CARE INSTRUCTIONS   Keep a diary. Inform your caregiver about your progress. This includes any medication side effects. See your caregiver regularly. Take note of:   Times when you are asleep.   Times when you are awake during the night.   The quality of your sleep.   How you feel the next day.  This information will help your caregiver care for you.  Get out of bed if you are still awake after 15 minutes. Read or do some quiet activity. Keep the lights down. Wait until you feel sleepy and go back to bed.   Keep regular sleeping and waking hours. Avoid naps.   Exercise regularly.   Avoid distractions at bedtime. Distractions include watching television or engaging in any intense or detailed activity like attempting to balance the household checkbook.   Develop a bedtime ritual. Keep a familiar routine of bathing, brushing your teeth, climbing into bed at the same time each night, listening to soothing music. Routines increase the success of falling to sleep faster.   Use relaxation techniques. This can be using breathing and muscle tension release routines. It can also include visualizing peaceful scenes. You can also help control troubling or intruding thoughts by keeping your mind occupied with boring or repetitive thoughts like the old concept of  counting sheep. You can make it more creative like imagining planting one beautiful flower after another in your backyard garden.   During your day, work to eliminate stress. When this is not possible use some of the previous suggestions to help reduce the anxiety that accompanies stressful situations.  MAKE SURE YOU:   Understand these instructions.   Will watch your condition.   Will get help right away if you are not doing well or get worse.  Document Released: 06/30/2000 Document Revised: 06/22/2011 Document Reviewed: 07/31/2007 [facility] Patient Information 2012 [place], [place].

## 2011-10-06 NOTE — Assessment & Plan Note (Signed)
con't meds 

## 2011-10-09 LAB — TESTOSTERONE, FREE, TOTAL, SHBG: Testosterone: 225.7 ng/dL — ABNORMAL LOW (ref 250–890)

## 2011-10-27 ENCOUNTER — Institutional Professional Consult (permissible substitution): Payer: BC Managed Care – PPO | Admitting: Pulmonary Disease

## 2011-12-01 ENCOUNTER — Institutional Professional Consult (permissible substitution): Payer: BC Managed Care – PPO | Admitting: Pulmonary Disease

## 2011-12-20 ENCOUNTER — Institutional Professional Consult (permissible substitution): Payer: Self-pay | Admitting: Pulmonary Disease

## 2012-01-09 ENCOUNTER — Institutional Professional Consult (permissible substitution): Payer: Self-pay | Admitting: Pulmonary Disease

## 2012-01-11 ENCOUNTER — Telehealth: Payer: Self-pay

## 2012-01-11 NOTE — Telephone Encounter (Signed)
Call from patient and he stated he is having trouble falling a sleep, he says once he finally falls asleep he can stay asleep but he can not fall asleep. He is on Amtriptyline 50 1-2 30 min's before bedtime but he doesn't fall sleep until about 2 am. He wanted to know should he come here for a follow up or what route should go to help get something to help him sleep.   Please advise.   KP

## 2012-01-11 NOTE — Telephone Encounter (Signed)
Refer to pulm for insomnia

## 2012-01-11 NOTE — Telephone Encounter (Signed)
Patient aware and he said he will call Pulmonary to reschedule an apt.     KP

## 2012-02-23 ENCOUNTER — Encounter: Payer: Self-pay | Admitting: Pulmonary Disease

## 2012-02-23 ENCOUNTER — Ambulatory Visit (INDEPENDENT_AMBULATORY_CARE_PROVIDER_SITE_OTHER): Payer: Self-pay | Admitting: Pulmonary Disease

## 2012-02-23 VITALS — BP 138/82 | HR 76 | Temp 98.1°F | Ht 74.0 in | Wt 193.6 lb

## 2012-02-23 DIAGNOSIS — G4700 Insomnia, unspecified: Secondary | ICD-10-CM

## 2012-02-23 DIAGNOSIS — G4733 Obstructive sleep apnea (adult) (pediatric): Secondary | ICD-10-CM

## 2012-02-23 NOTE — Patient Instructions (Addendum)
Can try melatonin 3mg  about 3 hours before bedtime to see if it will help with sleep. Discuss with your pain specialist working on improved pain control.  This is key to your sleep. Will hold off on restarting cpap for now, but it may contribute to your sleep maintenance.  Try going to bed at 11-4mn, and arising at 7am no matter how much sleep you get.  Do not stay in bed more than if you cannot fall asleep.  Leave bedroom as we discussed, and only return when you get sleepy.  The other possibility to try is sleep restriction therapy, where you will go to bed 2-3am, and move bedtime back by every week if you are able to stay asleep until 7am.   Try the above behavioral therapies, and give me some feedback in 4-6 weeks.  If continue to have issues, can try cpap.

## 2012-02-23 NOTE — Assessment & Plan Note (Signed)
The patient has a history of moderate obstructive sleep apnea, but feels that his symptoms greatly improved after his nasal surgery.  He currently is not wearing CPAP.  It is unclear whether his sleep apnea has anything to do with his sleep maintenance issues.  Often times, the patient's sleep onset has to be treated before the patient can tolerate going back on CPAP.  I would like to hold off on reinitiating CPAP until we see how he does with behavioral therapies.

## 2012-02-23 NOTE — Assessment & Plan Note (Signed)
I suspect the patient's prime issue here is chronic pain.  In total this is adequately treated, he will never improve his sleep.  I have gone over his sleep hygiene with him, and have also discussed the various behavioral therapies.  I have outlined both stimulus control and sleep restriction therapy.  He would like to try this for the next 4-6 weeks and see how he responds.  If he does not do well, he is willing to try CPAP to see if it will make a difference or not.

## 2012-02-23 NOTE — Progress Notes (Signed)
Subjective:    Patient ID: Willie Andrews, male    DOB: 07/25/1955, 56 y.o.   MRN: [id number]  HPI The patient is a 56 year old male who I've been asked to see for multiple sleep issues.  He was diagnosed with moderate obstructive sleep apnea in 2007, with an AHI of 25 events per hour.  He was started on CPAP, and did wear this for approximately one year.  He had issues with tolerating higher pressures, and ultimately discontinued the CPAP device.  He currently is having issues with sleep onset and maintenance, and although he is snoring, his bed partner has not seen an abnormal breathing pattern during sleep.  He typically will go to bed between 11:30 and 2 AM, and takes a minimum of 30 minutes, but often hours.  He may sleep for a few hours, then awakened, and may or may not be able to go back to sleep.  He does not read or watch TV in bed at sleep onset.  He has very severe degenerative spine disease, and has a significant chronic pain syndrome which contributes to his sleep onset and maintenance.  He typically gets out of bed around 9 AM, and at least 60% of the time he is unrested.  He has some sleepiness during the day and may doze, but this is uncommon.  His degree of sleep pressure during the day is variable.  The patient's weight is stable over the last few years, and his Epworth score today is only 2  Sleep Questionnaire: What time do you typically go to bed?( Between what hours) 11:30p-2am How long does it take you to fall asleep? 30 mins How many times during the night do you wake up? 3 What time do you get out of bed to start your day? 0900 Do you drive or operate heavy machinery in your occupation? No How much has your weight changed (up or down) over the past two years? (In pounds) 10 lb (4.536 kg) Have you ever had a sleep study before? Yes If yes, location of study? [facility] If yes, date of study? 2004 Do you currently use CPAP? No Do you wear oxygen at any time? No     Review of  Systems  Constitutional: Negative.  Negative for fever and unexpected weight change.  HENT: Positive for congestion and dental problem. Negative for ear pain, nosebleeds, sore throat, rhinorrhea, sneezing, trouble swallowing, postnasal drip and sinus pressure.   Eyes: Negative.  Negative for redness and itching.  Respiratory: Positive for cough and shortness of breath. Negative for chest tightness and wheezing.   Cardiovascular: Negative.  Negative for palpitations and leg swelling.  Gastrointestinal: Negative for nausea and vomiting.       Indegestion  Genitourinary: Negative.  Negative for dysuria.  Musculoskeletal: Negative.  Negative for joint swelling.  Skin: Negative for rash.  Neurological: Positive for headaches.  Hematological: Negative.  Does not bruise/bleed easily.  Psychiatric/Behavioral: Negative.  Negative for dysphoric mood. The patient is not nervous/anxious.        Objective:   Physical Exam Constitutional:  Well developed, no acute distress  HENT:  Nares patent without discharge, but narrowed bilat  Oropharynx without exudate, palate and uvula are moderately elongated.   Eyes:  Perrla, eomi, no scleral icterus  Neck:  No JVD, no TMG  Cardiovascular:  Normal rate, regular rhythm, no rubs or gallops.  No murmurs        Intact distal pulses  Pulmonary :  Normal breath  sounds, no stridor or respiratory distress   No rales, rhonchi, or wheezing  Abdominal:  Soft, nondistended, bowel sounds present.  No tenderness noted.   Musculoskeletal:  No lower extremity edema noted.  Lymph Nodes:  No cervical lymphadenopathy noted  Skin:  No cyanosis noted  Neurologic:  Alert, appropriate, moves all 4 extremities without obvious deficit.         Assessment & Plan:

## 2012-04-15 ENCOUNTER — Emergency Department (HOSPITAL_COMMUNITY): Payer: Self-pay

## 2012-04-15 ENCOUNTER — Encounter (HOSPITAL_COMMUNITY): Payer: Self-pay | Admitting: Family Medicine

## 2012-04-15 ENCOUNTER — Emergency Department (HOSPITAL_COMMUNITY)
Admission: EM | Admit: 2012-04-15 | Discharge: 2012-04-15 | Disposition: A | Discharge status: Home / Self Care | Payer: Self-pay | Attending: Emergency Medicine | Admitting: Emergency Medicine

## 2012-04-15 DIAGNOSIS — F17200 Nicotine dependence, unspecified, uncomplicated: Secondary | ICD-10-CM | POA: Insufficient documentation

## 2012-04-15 DIAGNOSIS — Z87898 Personal history of other specified conditions: Secondary | ICD-10-CM

## 2012-04-15 DIAGNOSIS — G4730 Sleep apnea, unspecified: Secondary | ICD-10-CM | POA: Insufficient documentation

## 2012-04-15 DIAGNOSIS — I10 Essential (primary) hypertension: Secondary | ICD-10-CM | POA: Insufficient documentation

## 2012-04-15 DIAGNOSIS — M129 Arthropathy, unspecified: Secondary | ICD-10-CM | POA: Insufficient documentation

## 2012-04-15 DIAGNOSIS — N400 Enlarged prostate without lower urinary tract symptoms: Secondary | ICD-10-CM | POA: Insufficient documentation

## 2012-04-15 DIAGNOSIS — G8929 Other chronic pain: Secondary | ICD-10-CM | POA: Insufficient documentation

## 2012-04-15 DIAGNOSIS — E7800 Pure hypercholesterolemia, unspecified: Secondary | ICD-10-CM | POA: Insufficient documentation

## 2012-04-15 DIAGNOSIS — R079 Chest pain, unspecified: Secondary | ICD-10-CM | POA: Insufficient documentation

## 2012-04-15 HISTORY — DX: Other chronic pain: G89.29

## 2012-04-15 LAB — CBC WITH DIFFERENTIAL/PLATELET
Basophils Absolute: 0 10*3/uL (ref 0.0–0.1)
Eosinophils Relative: 2 % (ref 0–5)
Lymphocytes Relative: 28 % (ref 12–46)
Lymphs Abs: 2.5 10*3/uL (ref 0.7–4.0)
MCV: 90.4 fL (ref 78.0–100.0)
Neutro Abs: 5.5 10*3/uL (ref 1.7–7.7)
Platelets: 213 10*3/uL (ref 150–400)
RBC: 4.7 MIL/uL (ref 4.22–5.81)
RDW: 14.1 % (ref 11.5–15.5)
WBC: 8.7 10*3/uL (ref 4.0–10.5)

## 2012-04-15 LAB — COMPREHENSIVE METABOLIC PANEL
ALT: 41 U/L (ref 0–53)
AST: 36 U/L (ref 0–37)
Alkaline Phosphatase: 87 U/L (ref 39–117)
CO2: 29 mEq/L (ref 19–32)
Chloride: 105 mEq/L (ref 96–112)
GFR calc Af Amer: >90 mL/min (ref >90)
GFR calc non Af Amer: >90 mL/min (ref >90)
Glucose, Bld: 131 mg/dL — ABNORMAL HIGH (ref 70–99)
Potassium: 3.8 mEq/L (ref 3.5–5.1)
Sodium: 143 mEq/L (ref 135–145)
Total Bilirubin: 0.2 mg/dL — ABNORMAL LOW (ref 0.3–1.2)

## 2012-04-15 MED ORDER — ASPIRIN 81 MG PO CHEW
324.0000 mg | CHEWABLE_TABLET | Freq: Once | ORAL | Status: AC
  Administered 2012-04-15: 324 mg via ORAL
  Filled 2012-04-15: qty 4

## 2012-04-15 NOTE — ED Notes (Signed)
Pt continues to state pain is increasing and spreading into abdomen. Pt noted to be eating chips in WR

## 2012-04-15 NOTE — ED Notes (Addendum)
Pt states that he "feels like his heart is pounding." Pt HR 77 and NSR. Denies radiating pain. Pt states he has had nausea, but denies vomiting. Pt states he is concerned about sunburn on his face, states that "i think it is sun poisoning."

## 2012-04-15 NOTE — ED Notes (Signed)
Went into pt room to start IV, pt stated "I want a cigarette before I get an IV." Informed pt of no smoking campus, and offered to get a nicotine patch, pt refused. Pt asked "can I walk across street and smoke and come back?" Pt informed that is against [facility] policy, and pt asked to leave AMA. Yvonna Alanis, PA notified.

## 2012-04-15 NOTE — ED Provider Notes (Signed)
History     CSN: [id number]  Arrival date & time 04/15/12  1243   First MD Initiated Contact with Patient 04/15/12 1718      Chief Complaint  Patient presents with  . Chest Pain    (Consider location/radiation/quality/duration/timing/severity/associated sxs/prior treatment) HPI Comments: Patient is a 56 year old male with a past medical history of hypercholesterolemia and hypertension that presents with chest pain. He reports intermittent chest pain episodes that started 10 days ago. He reports having a handful of these episodes in the past 10 days where he feels a pounding sensation in his chest and associated nausea that lasts about 5 minutes and spontaneously resolves. He has experienced syncope/pre-syncope during a few of these episodes lately. He denies any aggravating/alleviating factors. He has chronic pain for which he takes methadone and is tapering off percocet, neither of which help with the pain. He reports being at the pain clinic earlier today and expressing chest pain. An EKG was done and the doctor recommended he come to the ED. He denies radiation of the pain. He denies current fever, NVD, headache, visual changes, abdominal pain. He denies cardiac history and is unsure of his family history. He is a smoker.   Patient is a 56 y.o. male presenting with chest pain.  Chest Pain Primary symptoms include nausea.     Past Medical History  Diagnosis Date  . Arthritis   . Depression   . High cholesterol   . Kidney stones   . Migraine   . Benign prostatic hypertrophy     Dr. Earlene Plater  . Hypertension   . Seasonal allergies   . Sleep apnea   . Chronic pain     Past Surgical History  Procedure Date  . L5-s1   . Elbow surgery     Left 2008  . Neck surgery     Disc 2002 and 2008  . Shoulder surgery     Right  . Back surgery     1989, 1994, 2002, 2008  . Nasal sinus surgery     2004  . Back surgery 2012    Neuro Implant --elect. stimulator    Family History    Problem Relation Age of Onset  . Arthritis    . Alcohol abuse    . Cancer Mother     unknown type    History  Substance Use Topics  . Smoking status: Current Every Day Smoker -- 1.5 packs/day  . Smokeless tobacco: Not on file  . Alcohol Use: Yes     1 drink per night      Review of Systems  Cardiovascular: Positive for chest pain.  Gastrointestinal: Positive for nausea.  Musculoskeletal: Positive for back pain.  All other systems reviewed and are negative.    Allergies  Butrans and Ketorolac tromethamine  Home Medications   Current Outpatient Rx  Name Route Sig Dispense Refill  . GOODY HEADACHE PO Oral Take 1-2 packets by mouth 2 (two) times daily as needed. For pain    . GABAPENTIN 600 MG PO TABS Oral Take 1,200 mg by mouth every 6 (six) hours.    Marland Kitchen LISINOPRIL 20 MG PO TABS Oral Take 20 mg by mouth daily.    . OXYCODONE-ACETAMINOPHEN 10-325 MG PO TABS Oral Take 1 tablet by mouth every 12 (twelve) hours as needed. For pain    . RIZATRIPTAN BENZOATE 10 MG PO TABS Oral Take 10 mg by mouth as needed. May repeat in 2 hours if needed no more  than 2 tablet in 24 hours       BP 149/92  SpO2 98%  Physical Exam  Nursing note and vitals reviewed. Constitutional: He is oriented to person, place, and time. He appears well-developed and well-nourished. No distress.  HENT:  Head: Normocephalic and atraumatic.  Mouth/Throat: No oropharyngeal exudate.  Eyes: Conjunctivae normal are normal. Pupils are equal, round, and reactive to light. No scleral icterus.  Neck: Normal range of motion. Neck supple.  Cardiovascular: Normal rate, regular rhythm and intact distal pulses.  Exam reveals no gallop and no friction rub.   No murmur heard.      NSR noted on monitor during exam. Chest pain not reproducible.   Pulmonary/Chest: Effort normal and breath sounds normal. He has no wheezes. He has no rales. He exhibits no tenderness.  Abdominal: Soft. There is no tenderness.   Musculoskeletal: Normal range of motion.  Neurological: He is alert and oriented to person, place, and time. Coordination normal.       Speech is goal-oriented. Moves limbs without ataxia.   Skin: Skin is warm and dry.  Psychiatric: He has a normal mood and affect. His behavior is normal.    ED Course  Procedures (including critical care time)   Date: 04/15/2012  Rate: 81  Rhythm: normal sinus rhythm  QRS Axis: normal  Intervals: normal  ST/T Wave abnormalities: normal  Conduction Disutrbances:none  Narrative Interpretation: normal sinus rhythm  Old EKG Reviewed: none available    Labs Reviewed  COMPREHENSIVE METABOLIC PANEL - Abnormal; Notable for the following:    Glucose, Bld 131 (*)     Total Bilirubin 0.2 (*)     All other components within normal limits  CBC WITH DIFFERENTIAL  POCT I-STAT TROPONIN I   Dg Chest 2 View  04/15/2012  *RADIOLOGY REPORT*  Clinical Data: Chest pain.  CHEST - 2 VIEW  Comparison: January 14, 2008.  Findings: Cardiomediastinal silhouette appears normal.  Bony thorax appears intact.  No acute pulmonary disease is noted.  Stimulator device is seen overlying lower thoracic spinal canal.  IMPRESSION: No acute cardiopulmonary abnormality seen.   Original Report Authenticated By: Venita Sheffield., M.D.      1. H/O chest pain       MDM  6:01 PM Patient denies current chest pain. Aspirin ordered. His first troponin is negative. Chest xray negative. He has never had a cardiac work up. Consider chest pain rule out considering age and risk factors. EKG from earlier shows nonspecific T wave abnormalities.   7:04 PM I would like to admit the patient for chest pain rule out based on his history and risk factors. He has decided to sign out AMA. I have discussed the risk of leaving as his symptoms could worsen and result in fatality. He is encouraged to return to the ED as he desires.       Emilia Beck, PA-C 04/15/12 1905  Emilia Beck,  PA-C 04/15/12 1906

## 2012-04-15 NOTE — ED Notes (Signed)
Pt sts a few weeks of heart pounding and 2 syncopal episode. sts recently started on methadone for back pain. Denies hitting head when he fell.

## 2012-04-16 NOTE — ED Provider Notes (Signed)
Medical screening examination/treatment/procedure(s) were performed by non-physician practitioner and as supervising physician I was immediately available for consultation/collaboration.   Carleene Cooper III, MD 04/16/12 [phone]

## 2012-06-24 ENCOUNTER — Encounter: Payer: Self-pay | Admitting: Family Medicine

## 2012-06-24 ENCOUNTER — Telehealth: Payer: Self-pay | Admitting: Family Medicine

## 2012-06-24 ENCOUNTER — Ambulatory Visit (INDEPENDENT_AMBULATORY_CARE_PROVIDER_SITE_OTHER): Payer: Medicare Other | Admitting: Family Medicine

## 2012-06-24 VITALS — BP 134/82 | HR 102 | Temp 98.3°F | Wt 188.0 lb

## 2012-06-24 DIAGNOSIS — N390 Urinary tract infection, site not specified: Secondary | ICD-10-CM

## 2012-06-24 DIAGNOSIS — N529 Male erectile dysfunction, unspecified: Secondary | ICD-10-CM

## 2012-06-24 DIAGNOSIS — R002 Palpitations: Secondary | ICD-10-CM

## 2012-06-24 DIAGNOSIS — I10 Essential (primary) hypertension: Secondary | ICD-10-CM

## 2012-06-24 DIAGNOSIS — E785 Hyperlipidemia, unspecified: Secondary | ICD-10-CM

## 2012-06-24 LAB — BASIC METABOLIC PANEL
BUN: 15 mg/dL (ref 6–23)
CO2: 29 mEq/L (ref 19–32)
Calcium: 9.2 mg/dL (ref 8.4–10.5)
Creatinine, Ser: 1 mg/dL (ref 0.4–1.5)
GFR: 84.3 mL/min (ref >60.00)
Glucose, Bld: 152 mg/dL — ABNORMAL HIGH (ref 70–99)
Sodium: 136 mEq/L (ref 135–145)

## 2012-06-24 LAB — HEPATIC FUNCTION PANEL
Albumin: 4.2 g/dL (ref 3.5–5.2)
Alkaline Phosphatase: 81 U/L (ref 39–117)
Total Protein: 7.5 g/dL (ref 6.0–8.3)

## 2012-06-24 LAB — TSH: TSH: 0.59 u[IU]/mL (ref 0.35–5.50)

## 2012-06-24 LAB — LIPID PANEL
Cholesterol: 197 mg/dL (ref 0–200)
HDL: 35.7 mg/dL — ABNORMAL LOW (ref >39.00)
VLDL: 78.2 mg/dL — ABNORMAL HIGH (ref 0.0–40.0)

## 2012-06-24 MED ORDER — TADALAFIL 5 MG PO TABS: 5.0000 mg | ORAL_TABLET | Freq: Every day | ORAL | Status: DC | PRN

## 2012-06-24 MED ORDER — LISINOPRIL 20 MG PO TABS: 20.0000 mg | ORAL_TABLET | Freq: Every day | ORAL | Status: DC

## 2012-06-24 MED ORDER — PITAVASTATIN CALCIUM 2 MG PO TABS: 1.0000 | ORAL_TABLET | Freq: Every day | ORAL | Status: DC

## 2012-06-24 NOTE — Patient Instructions (Addendum)

## 2012-06-24 NOTE — Telephone Encounter (Signed)
Please advise      KP 

## 2012-06-24 NOTE — Telephone Encounter (Signed)
pravchol 20 mg #30  1 po qhs  2 refills

## 2012-06-24 NOTE — Telephone Encounter (Signed)
Patient states the Willie Andrews is too expensive and would like something generic. He uses [facility] at [facility].

## 2012-06-24 NOTE — Progress Notes (Signed)
  Subjective:    Patient here for follow-up of elevated blood pressure.  He is not exercising and is adherent to a low-salt diet.  Blood pressure is not checked at home. Cardiac symptoms: irregular heart beat and palpitations. Patient denies: chest pain, chest pressure/discomfort, claudication, dyspnea, exertional chest pressure/discomfort, fatigue, lower extremity edema, near-syncope, orthopnea, paroxysmal nocturnal dyspnea, syncope and tachypnea. Cardiovascular risk factors: dyslipidemia, family history of premature cardiovascular disease, hypertension, male gender, sedentary lifestyle and smoking/ tobacco exposure. Use of agents associated with hypertension: none. History of target organ damage: none.  The following portions of the patient's history were reviewed and updated as appropriate:  He  has a past medical history of Arthritis; Depression; High cholesterol; Kidney stones; Migraine; Benign prostatic hypertrophy; Hypertension; Seasonal allergies; Sleep apnea; and Chronic pain. He  does not have any pertinent problems on file. He  has past surgical history that includes L5-S1; Elbow surgery; Neck surgery; Shoulder surgery; Back surgery; Nasal sinus surgery; Back surgery (2012); and Spine surgery (85 94). His family history includes Alcohol abuse in an unspecified family member; Arthritis in an unspecified family member; and Cancer in his mother. He  reports that he has been smoking.  He does not have any smokeless tobacco history on file. He reports that he drinks alcohol. He reports that he does not use illicit drugs. He has a current medication list which includes the following prescription(s): aspirin-acetaminophen-caffeine, oxycodone, oxycodone-acetaminophen, rizatriptan, lisinopril, pitavastatin calcium, and tadalafil. Current Outpatient Prescriptions on File Prior to Visit  Medication Sig Dispense Refill  . Aspirin-Acetaminophen-Caffeine (GOODY HEADACHE PO) Take 1-2 packets by mouth 2  (two) times daily as needed. For pain      . oxyCODONE-acetaminophen (PERCOCET) 10-325 MG per tablet Take 1 tablet by mouth every 12 (twelve) hours as needed. For pain      . rizatriptan (MAXALT) 10 MG tablet Take 10 mg by mouth as needed. May repeat in 2 hours if needed no more than 2 tablet in 24 hours       . Pitavastatin Calcium 2 MG TABS Take 1 tablet (2 mg total) by mouth at bedtime.  30 tablet  2  . [DISCONTINUED] tadalafil (CIALIS) 5 MG tablet Take 1 tablet (5 mg total) by mouth daily as needed for erectile dysfunction.  30 tablet  2  . [DISCONTINUED] vardenafil (LEVITRA) 20 MG tablet Take 20 mg by mouth as directed.         He is allergic to butrans and ketorolac tromethamine..  Review of Systems Pertinent items are noted in HPI.     Objective:    BP 134/82  Pulse 102  Temp 98.3 F (36.8 C) (Oral)  Wt 188 lb (85.276 kg)  SpO2 94% General appearance: alert, cooperative, appears stated age and no distress Neck: no adenopathy, supple, symmetrical, trachea midline and thyroid not enlarged, symmetric, no tenderness/mass/nodules Lungs: clear to auscultation bilaterally Heart: S1, S2 normal Extremities: extremities normal, atraumatic, no cyanosis or edema    Assessment:    Hypertension, .  palpitations-- check echo,  See ER visit,  Refer to cards Plan:    Medication: restart meds. Dietary sodium restriction. Regular aerobic exercise. Check blood pressures 2-3 times weekly and record. Follow up: 3 weeks and as needed.

## 2012-06-25 LAB — POCT URINALYSIS DIPSTICK
Blood, UA: NEGATIVE
Ketones, UA: NEGATIVE
Protein, UA: NEGATIVE
Spec Grav, UA: <=1.005

## 2012-06-25 MED ORDER — PRAVASTATIN SODIUM 20 MG PO TABS: 20.0000 mg | ORAL_TABLET | Freq: Every day | ORAL | Status: DC

## 2012-06-25 NOTE — Progress Notes (Signed)
I did not mean to put in a urine culture .                                        Sorry Willie Andrews

## 2012-06-25 NOTE — Addendum Note (Signed)
Addended by: Silvio Pate D on: 06/25/2012 04:29 PM   Modules accepted: Orders

## 2012-06-27 ENCOUNTER — Other Ambulatory Visit (HOSPITAL_COMMUNITY): Payer: Medicare Other

## 2012-06-27 LAB — LDL CHOLESTEROL, DIRECT: Direct LDL: 109 mg/dL

## 2012-07-03 ENCOUNTER — Other Ambulatory Visit (HOSPITAL_COMMUNITY): Payer: Medicare Other

## 2012-07-03 ENCOUNTER — Other Ambulatory Visit (INDEPENDENT_AMBULATORY_CARE_PROVIDER_SITE_OTHER): Payer: Medicare Other

## 2012-07-03 DIAGNOSIS — R7989 Other specified abnormal findings of blood chemistry: Secondary | ICD-10-CM

## 2012-07-03 LAB — BASIC METABOLIC PANEL
CO2: 29 mEq/L (ref 19–32)
Chloride: 104 mEq/L (ref 96–112)
Potassium: 4.3 mEq/L (ref 3.5–5.1)

## 2012-07-03 LAB — HEMOGLOBIN A1C: Hgb A1c MFr Bld: 6 % (ref 4.6–6.5)

## 2012-07-04 ENCOUNTER — Ambulatory Visit (HOSPITAL_COMMUNITY): Payer: Medicare Other | Attending: Family Medicine | Admitting: Radiology

## 2012-07-04 ENCOUNTER — Other Ambulatory Visit: Payer: Self-pay

## 2012-07-04 DIAGNOSIS — F17200 Nicotine dependence, unspecified, uncomplicated: Secondary | ICD-10-CM | POA: Insufficient documentation

## 2012-07-04 DIAGNOSIS — E785 Hyperlipidemia, unspecified: Secondary | ICD-10-CM | POA: Insufficient documentation

## 2012-07-04 DIAGNOSIS — I10 Essential (primary) hypertension: Secondary | ICD-10-CM | POA: Insufficient documentation

## 2012-07-04 DIAGNOSIS — I359 Nonrheumatic aortic valve disorder, unspecified: Secondary | ICD-10-CM

## 2012-07-04 DIAGNOSIS — R002 Palpitations: Secondary | ICD-10-CM | POA: Insufficient documentation

## 2012-07-04 NOTE — Progress Notes (Signed)
Echocardiogram performed.  

## 2012-07-09 ENCOUNTER — Encounter: Payer: Self-pay | Admitting: *Deleted

## 2012-07-15 DIAGNOSIS — G894 Chronic pain syndrome: Secondary | ICD-10-CM | POA: Insufficient documentation

## 2012-07-16 ENCOUNTER — Encounter: Payer: Self-pay | Admitting: Family Medicine

## 2012-07-16 ENCOUNTER — Ambulatory Visit (INDEPENDENT_AMBULATORY_CARE_PROVIDER_SITE_OTHER): Payer: Medicare Other | Admitting: Family Medicine

## 2012-07-16 VITALS — BP 134/62 | HR 78 | Temp 98.6°F | Wt 188.2 lb

## 2012-07-16 DIAGNOSIS — Z5181 Encounter for therapeutic drug level monitoring: Secondary | ICD-10-CM

## 2012-07-16 DIAGNOSIS — R5383 Other fatigue: Secondary | ICD-10-CM

## 2012-07-16 DIAGNOSIS — E785 Hyperlipidemia, unspecified: Secondary | ICD-10-CM

## 2012-07-16 DIAGNOSIS — Z79899 Other long term (current) drug therapy: Secondary | ICD-10-CM

## 2012-07-16 DIAGNOSIS — R5381 Other malaise: Secondary | ICD-10-CM

## 2012-07-16 DIAGNOSIS — I10 Essential (primary) hypertension: Secondary | ICD-10-CM

## 2012-07-16 DIAGNOSIS — F17200 Nicotine dependence, unspecified, uncomplicated: Secondary | ICD-10-CM

## 2012-07-16 DIAGNOSIS — N529 Male erectile dysfunction, unspecified: Secondary | ICD-10-CM

## 2012-07-16 DIAGNOSIS — R002 Palpitations: Secondary | ICD-10-CM

## 2012-07-16 DIAGNOSIS — E291 Testicular hypofunction: Secondary | ICD-10-CM

## 2012-07-16 DIAGNOSIS — Z125 Encounter for screening for malignant neoplasm of prostate: Secondary | ICD-10-CM

## 2012-07-16 DIAGNOSIS — R7989 Other specified abnormal findings of blood chemistry: Secondary | ICD-10-CM | POA: Insufficient documentation

## 2012-07-16 LAB — CBC WITH DIFFERENTIAL/PLATELET
Basophils Absolute: 0 10*3/uL (ref 0.0–0.1)
Eosinophils Absolute: 0.2 10*3/uL (ref 0.0–0.7)
HCT: 45.6 % (ref 39.0–52.0)
Hemoglobin: 15.5 g/dL (ref 13.0–17.0)
Lymphs Abs: 2.6 10*3/uL (ref 0.7–4.0)
MCHC: 34 g/dL (ref 30.0–36.0)
MCV: 89.3 fl (ref 78.0–100.0)
Monocytes Absolute: 0.5 10*3/uL (ref 0.1–1.0)
Neutro Abs: 6.7 10*3/uL (ref 1.4–7.7)
Platelets: 184 10*3/uL (ref 150.0–400.0)
RDW: 13.1 % (ref 11.5–14.6)

## 2012-07-16 LAB — POCT URINALYSIS DIPSTICK
Bilirubin, UA: NEGATIVE
Glucose, UA: NEGATIVE
Leukocytes, UA: NEGATIVE
Nitrite, UA: NEGATIVE
pH, UA: 6

## 2012-07-16 LAB — BASIC METABOLIC PANEL
BUN: 12 mg/dL (ref 6–23)
CO2: 27 mEq/L (ref 19–32)
Glucose, Bld: 132 mg/dL — ABNORMAL HIGH (ref 70–99)
Potassium: 3.8 mEq/L (ref 3.5–5.1)
Sodium: 139 mEq/L (ref 135–145)

## 2012-07-16 LAB — HEPATIC FUNCTION PANEL
AST: 23 U/L (ref 0–37)
Albumin: 3.8 g/dL (ref 3.5–5.2)

## 2012-07-16 LAB — TSH: TSH: 2 u[IU]/mL (ref 0.35–5.50)

## 2012-07-16 NOTE — Patient Instructions (Addendum)

## 2012-07-16 NOTE — Assessment & Plan Note (Signed)
con't meds  Recheck labs 3 months 

## 2012-07-16 NOTE — Assessment & Plan Note (Signed)
See Echo Pt to see cardio in February

## 2012-07-16 NOTE — Progress Notes (Signed)
  Subjective:  321  Patient ID: Willie Andrews, male    DOB: February 19, 1956, 56 y.o.   MRN: [id number]  HPI Pt here f/u last ov---bp and still having palpitations.   Pt has appointment in February with cardio.   Echo reviewed.   Pt is c/o fatigue and feels he needs to see urology for testosterone again. He has not seen them in almost a year.       Review of Systems  as above   Objective:   Physical Exam  BP 134/62  Pulse 78  Temp 98.6 F (37 C) (Oral)  Wt 188 lb 3.2 oz (85.367 kg)  SpO2 97% General appearance: alert, cooperative, appears stated age and no distress Lungs: clear to auscultation bilaterally Heart: S1, S2 normal--+ murmur Extremities: extremities normal, atraumatic, no cyanosis or edema      Assessment & Plan:

## 2012-07-16 NOTE — Assessment & Plan Note (Signed)
Stable con't meds 

## 2012-07-16 NOTE — Assessment & Plan Note (Signed)
Check labs F/u urology

## 2012-07-16 NOTE — Assessment & Plan Note (Signed)
Pt not ready to quit smoking yet

## 2012-07-18 LAB — TESTOSTERONE, FREE, TOTAL, SHBG
Testosterone, Free: 29.9 pg/mL — ABNORMAL LOW (ref 47.0–244.0)
Testosterone-% Free: 1.3 % — ABNORMAL LOW (ref 1.6–2.9)
Testosterone: 222.23 ng/dL — ABNORMAL LOW (ref 300–890)

## 2012-07-23 ENCOUNTER — Encounter: Payer: Self-pay | Admitting: Cardiology

## 2012-07-23 ENCOUNTER — Ambulatory Visit (INDEPENDENT_AMBULATORY_CARE_PROVIDER_SITE_OTHER): Payer: Medicare Other | Admitting: Cardiology

## 2012-07-23 VITALS — BP 122/84 | HR 80 | Ht 74.0 in | Wt 183.0 lb

## 2012-07-23 DIAGNOSIS — F17200 Nicotine dependence, unspecified, uncomplicated: Secondary | ICD-10-CM

## 2012-07-23 DIAGNOSIS — I10 Essential (primary) hypertension: Secondary | ICD-10-CM

## 2012-07-23 DIAGNOSIS — I359 Nonrheumatic aortic valve disorder, unspecified: Secondary | ICD-10-CM

## 2012-07-23 DIAGNOSIS — R002 Palpitations: Secondary | ICD-10-CM

## 2012-07-23 NOTE — Progress Notes (Signed)
Patient ID: Willie Andrews, male   DOB: 09-12-1955, 57 y.o.   MRN: [id number] PCP: Dr. Laury Axon  57 yo with history of HTN, smoking, low back pain on disability, and hyperlipidemia presents for cardiology evaluation.  Main complaint is palpitations.  He has been feeling fluttering in his chest for years, but it has been getting worse recently.  Typically 2-3 times a week, he will feel a fluttering sensation in his chest.  It does not cause dyspnea, chest pain, or lightheadedness.  It is just a bothersome feeling.  No trigger.  His cousin told him that this is what atrial fibrillation feels like (cousin has atrial fibrillation).  He is not very active due to back pain.  He follows in a pain clinic.  No exertional dyspnea or chest pain.  He had an echo done due to palpitations which showed normal EF, moderate LVH, and mild to moderate aortic insufficiency.  He is on lisinopril for BP.  BP is within normal limits today.  He still smokes 1.5 ppd.   ECG (12/13): Artifact, NSR, nonspecific T wave abnormalities.   Labs (12/13): K 3.8, creatinine 1.0, TSH normal, TGs 391, HDL 36, LDL 109  PMH: 1. HTN 2. Hyperlipidemia 3. Active smoker 4. Depression 5. Migraines 6. BPH 7. OSA 8. Low back pain s/p L-spine surgery.  9. Aortic insufficiency: Possibly due to HTN.  Echo (12/13) with EF 55-60%, moderate LVH, grade I diastolic dysfunction, mild to moderate AI, trileaflet aortic valve.    SH: Single, smokes 1.5 ppd.  On disability from back pain.  Lives in [place].   FH: Uncle with CABG.  Cousin with atrial fibrillation.   ROS: All systems reviewed and negative except as per HPI.   Current Outpatient Prescriptions  Medication Sig Dispense Refill  . Aspirin-Acetaminophen-Caffeine (GOODY HEADACHE PO) Take 1-2 packets by mouth 2 (two) times daily as needed. For pain      . lisinopril (PRINIVIL,ZESTRIL) 20 MG tablet Take 1 tablet (20 mg total) by mouth daily.  90 tablet  3  . OxyCODONE (OXYCONTIN) 20 mg T12A  Take 20 mg by mouth every 8 (eight) hours.      Marland Kitchen oxyCODONE-acetaminophen (PERCOCET) 10-325 MG per tablet Take 1 tablet by mouth every 12 (twelve) hours as needed. For pain      . pravastatin (PRAVACHOL) 20 MG tablet Take 1 tablet (20 mg total) by mouth daily.  30 tablet  2  . rizatriptan (MAXALT) 10 MG tablet Take 10 mg by mouth as needed. May repeat in 2 hours if needed no more than 2 tablet in 24 hours       . tadalafil (CIALIS) 5 MG tablet Take 1 tablet (5 mg total) by mouth daily as needed for erectile dysfunction.  30 tablet  5  . aspirin EC 81 MG tablet Take 1 tablet (81 mg total) by mouth daily.      . [DISCONTINUED] vardenafil (LEVITRA) 20 MG tablet Take 20 mg by mouth as directed.          BP 122/84  Pulse 80  Ht 6\' 2"  (1.88 m)  Wt 183 lb (83.008 kg)  BMI 23.50 kg/m2 General: NAD Neck: No JVD, no thyromegaly or thyroid nodule.  Lungs: Clear to auscultation bilaterally with normal respiratory effort. CV: Nondisplaced PMI.  Heart regular S1/S2, no S3/S4, 1/6 SEM RUSB.  No peripheral edema.  No carotid bruit.  Normal pedal pulses.  Abdomen: Soft, nontender, no hepatosplenomegaly, no distention.  Skin: Intact without lesions or  rashes.  Neurologic: Alert and oriented x 3.  Psych: Normal affect. Extremities: No clubbing or cyanosis.  HEENT: Normal.   Assessment/Plan: 1. HTN: Controlled today but uncertain about long-term control.  He has a BP cuff at home.  I would like him to check it daily and record.  I will go over the numbers with him at next appointment.  2. Aortic insufficiency: Mild to moderate on recent echo.  The aortic valve is trileaflet.  The AI may be related to HTN.  Continue BP control, for now with just lisinopril.   Will plan to repeat an echo in 2 years.   3. Palpitations: Possible PACs or PVCs most likely, but he does have risk factors for atrial fibrillation (HTN).  I will have him wear a 2 week event monitor.   4. Smoking: I strongly encouraged him to quit.    He will start taking ASA 81 mg daily and will try to avoid Goody's powder.   Marca Ancona 07/23/2012

## 2012-07-23 NOTE — Patient Instructions (Addendum)
Take aspirin 81mg  daily. Take this with food.   Your physician has recommended that you wear an event monitor. Event monitors are medical devices that record the heart's electrical activity. Doctors most often [place] these monitors to diagnose arrhythmias. Arrhythmias are problems with the speed or rhythm of the heartbeat. The monitor is a small, portable device. You can wear one while you do your normal daily activities. This is usually used to diagnose what is causing palpitations/syncope (passing out). 2 week event monitor.  Your physician recommends that you schedule a follow-up appointment in: 1 month with Dr Shirlee Latch.   Take and record your blood pressure daily. Bring a record of these readings when you come to see Dr Shirlee Latch in 1 month.

## 2012-07-31 ENCOUNTER — Ambulatory Visit (INDEPENDENT_AMBULATORY_CARE_PROVIDER_SITE_OTHER): Payer: Medicare Other

## 2012-07-31 DIAGNOSIS — R002 Palpitations: Secondary | ICD-10-CM

## 2012-07-31 DIAGNOSIS — I359 Nonrheumatic aortic valve disorder, unspecified: Secondary | ICD-10-CM

## 2012-07-31 NOTE — Progress Notes (Signed)
Placed a 30 day event monitor on patient and went over instructions on how to use the monitor and when to return it 

## 2012-08-03 ENCOUNTER — Emergency Department (HOSPITAL_COMMUNITY): Payer: Medicare Other

## 2012-08-03 ENCOUNTER — Encounter (HOSPITAL_COMMUNITY): Payer: Self-pay | Admitting: Emergency Medicine

## 2012-08-03 ENCOUNTER — Emergency Department (HOSPITAL_COMMUNITY)
Admission: EM | Admit: 2012-08-03 | Discharge: 2012-08-03 | Disposition: A | Discharge status: Home / Self Care | Payer: Medicare Other | Attending: Emergency Medicine | Admitting: Emergency Medicine

## 2012-08-03 DIAGNOSIS — F17200 Nicotine dependence, unspecified, uncomplicated: Secondary | ICD-10-CM | POA: Insufficient documentation

## 2012-08-03 DIAGNOSIS — Z79899 Other long term (current) drug therapy: Secondary | ICD-10-CM | POA: Insufficient documentation

## 2012-08-03 DIAGNOSIS — K5900 Constipation, unspecified: Secondary | ICD-10-CM | POA: Insufficient documentation

## 2012-08-03 DIAGNOSIS — Z7982 Long term (current) use of aspirin: Secondary | ICD-10-CM | POA: Insufficient documentation

## 2012-08-03 DIAGNOSIS — Z8679 Personal history of other diseases of the circulatory system: Secondary | ICD-10-CM | POA: Insufficient documentation

## 2012-08-03 DIAGNOSIS — G4730 Sleep apnea, unspecified: Secondary | ICD-10-CM | POA: Insufficient documentation

## 2012-08-03 DIAGNOSIS — R109 Unspecified abdominal pain: Secondary | ICD-10-CM | POA: Insufficient documentation

## 2012-08-03 DIAGNOSIS — Z87448 Personal history of other diseases of urinary system: Secondary | ICD-10-CM | POA: Insufficient documentation

## 2012-08-03 DIAGNOSIS — E7800 Pure hypercholesterolemia, unspecified: Secondary | ICD-10-CM | POA: Insufficient documentation

## 2012-08-03 DIAGNOSIS — Z87442 Personal history of urinary calculi: Secondary | ICD-10-CM | POA: Insufficient documentation

## 2012-08-03 DIAGNOSIS — G8929 Other chronic pain: Secondary | ICD-10-CM | POA: Insufficient documentation

## 2012-08-03 DIAGNOSIS — Z8659 Personal history of other mental and behavioral disorders: Secondary | ICD-10-CM | POA: Insufficient documentation

## 2012-08-03 DIAGNOSIS — Z8739 Personal history of other diseases of the musculoskeletal system and connective tissue: Secondary | ICD-10-CM | POA: Insufficient documentation

## 2012-08-03 DIAGNOSIS — I10 Essential (primary) hypertension: Secondary | ICD-10-CM | POA: Insufficient documentation

## 2012-08-03 LAB — URINALYSIS, ROUTINE W REFLEX MICROSCOPIC
Nitrite: NEGATIVE
Specific Gravity, Urine: 1.017 (ref 1.005–1.030)
Urobilinogen, UA: 0.2 mg/dL (ref 0.0–1.0)
pH: 5 (ref 5.0–8.0)

## 2012-08-03 LAB — CBC
MCV: 86.9 fL (ref 78.0–100.0)
Platelets: 165 10*3/uL (ref 150–400)
RBC: 5.42 MIL/uL (ref 4.22–5.81)
WBC: 10.3 10*3/uL (ref 4.0–10.5)

## 2012-08-03 LAB — BASIC METABOLIC PANEL
CO2: 23 mEq/L (ref 19–32)
Chloride: 99 mEq/L (ref 96–112)
Sodium: 136 mEq/L (ref 135–145)

## 2012-08-03 LAB — URINE MICROSCOPIC-ADD ON

## 2012-08-03 MED ORDER — HYDROMORPHONE HCL PF 2 MG/ML IJ SOLN
2.0000 mg | Freq: Once | INTRAMUSCULAR | Status: AC
  Administered 2012-08-03: 2 mg via INTRAVENOUS
  Filled 2012-08-03: qty 1

## 2012-08-03 MED ORDER — IOHEXOL 300 MG/ML  SOLN
50.0000 mL | Freq: Once | INTRAMUSCULAR | Status: AC | PRN
  Administered 2012-08-03: 50 mL via ORAL

## 2012-08-03 MED ORDER — IOHEXOL 300 MG/ML  SOLN
100.0000 mL | Freq: Once | INTRAMUSCULAR | Status: AC | PRN
  Administered 2012-08-03: 100 mL via INTRAVENOUS

## 2012-08-03 NOTE — ED Provider Notes (Signed)
History     CSN: [id number]  Arrival date & time 08/03/12  4098   First MD Initiated Contact with Patient 08/03/12 [phone]      Chief Complaint  Patient presents with  . Abdominal Pain     The history is provided by the patient.   the patient reports 2 days of lower abdominal pain.  He has had some urinary frequency without discomfort.  He denies penile pain.  He wrote ports it feels like the pain radiates into his right scrotum.  No testicular pain or swelling.  No testicular masses noted by the patient.  He saw his urologist 4 days ago without cause noted for his pain.  No fevers or chills.  Nausea without vomiting.  No diarrhea.  Normal bowel movement yesterday.  The patient is on chronic pain medicines and these have not been helping his pain.  No melena or hematochezia  Past Medical History  Diagnosis Date  . Arthritis   . Depression   . High cholesterol   . Kidney stones   . Migraine   . Benign prostatic hypertrophy     Dr. Earlene Plater  . Hypertension   . Seasonal allergies   . Sleep apnea   . Chronic pain     Past Surgical History  Procedure Date  . L5-s1   . Elbow surgery     Left 2008  . Neck surgery     Disc 2002 and 2008  . Shoulder surgery     Right  . Back surgery     1989, 1994, 2002, 2008  . Nasal sinus surgery     2004  . Back surgery 2012    Neuro Implant --elect. stimulator  . Spine surgery 85 94    Family History  Problem Relation Age of Onset  . Arthritis    . Alcohol abuse    . Cancer Mother     unknown type    History  Substance Use Topics  . Smoking status: Current Every Day Smoker -- 1.5 packs/day  . Smokeless tobacco: Not on file  . Alcohol Use: Yes     Comment: 1 drink per night      Review of Systems  Gastrointestinal: Positive for abdominal pain.  All other systems reviewed and are negative.    Allergies  Butrans; Ketorolac tromethamine; and Opana  Home Medications   Current Outpatient Rx  Name  Route  Sig  Dispense   Refill  . ASPIRIN EC 81 MG PO TBEC   Oral   Take 1 tablet (81 mg total) by mouth daily.         Marland Kitchen DIPHENHYDRAMINE HCL 25 MG PO CAPS   Oral   Take 50 mg by mouth at bedtime as needed. For sleep         . IBUPROFEN 200 MG PO TABS   Oral   Take 400 mg by mouth every 6 (six) hours as needed. For pain         . LISINOPRIL 20 MG PO TABS   Oral   Take 20 mg by mouth daily.         Marland Kitchen MELATONIN 1 MG PO CAPS   Oral   Take 2 mg by mouth at bedtime.         . OXYCODONE HCL ER 20 MG PO T12A   Oral   Take 20 mg by mouth every 8 (eight) hours.         . OXYCODONE-ACETAMINOPHEN 10-325 MG  PO TABS   Oral   Take 1 tablet by mouth every 8 (eight) hours as needed. For pain         . PRAVASTATIN SODIUM 20 MG PO TABS   Oral   Take 20 mg by mouth daily.         Marland Kitchen RIZATRIPTAN BENZOATE 10 MG PO TABS   Oral   Take 10 mg by mouth as needed. May repeat in 2 hours if needed no more than 2 tablet in 24 hours For migraines           BP 171/95  Pulse 81  Temp 98 F (36.7 C) (Oral)  Resp 18  SpO2 98%  Physical Exam  Nursing note and vitals reviewed. Constitutional: He is oriented to person, place, and time. He appears well-developed and well-nourished.  HENT:  Head: Normocephalic and atraumatic.  Eyes: EOM are normal.  Neck: Normal range of motion.  Cardiovascular: Normal rate, regular rhythm, normal heart sounds and intact distal pulses.   Pulmonary/Chest: Effort normal and breath sounds normal. No respiratory distress.  Abdominal: Soft. He exhibits no distension. There is no tenderness.  Genitourinary:       Mild tenderness in right inguinal canal without obvious right inguinal hernia noted.  Normal penis without tenderness.  No penile discharge.  Normal lie of his testicles.  No testicular tenderness swelling or mass.  No scrotal changes.  No scrotal erythema or perineal pain/tenderness  Musculoskeletal: Normal range of motion.  Neurological: He is alert and oriented to  person, place, and time.  Skin: Skin is warm and dry.  Psychiatric: He has a normal mood and affect. Judgment normal.    ED Course  Procedures (including critical care time)  Labs Reviewed  URINALYSIS, ROUTINE W REFLEX MICROSCOPIC - Abnormal; Notable for the following:    Hgb urine dipstick TRACE (*)     All other components within normal limits  CBC - Abnormal; Notable for the following:    MCHC 36.1 (*)     All other components within normal limits  BASIC METABOLIC PANEL  URINE MICROSCOPIC-ADD ON   Ct Abdomen Pelvis W Contrast  08/03/2012  *RADIOLOGY REPORT*  Clinical Data: Lower abdominal pain for 2 days.  Urinary incontinence.  Hyper lipidemia.  Hypertension.  CT ABDOMEN AND PELVIS WITH CONTRAST  Technique:  Multidetector CT imaging of the abdomen and pelvis was performed following the standard protocol during bolus administration of intravenous contrast.  Contrast: OMNIPAQUE IOHEXOL 300 MG/ML  SOLN  Comparison: 09/03/2007 stone study  Findings: Lungs bases: Mild subsegmental atelectasis within the lingula. Normal heart size without pericardial or pleural effusion. Possible distal esophageal wall thickening on image 2/series 2.  Abdomen/pelvis: Mild hepatic steatosis with sparing adjacent the gallbladder.  Minimal subcapsular splenic hypoattenuation which is nonspecific.  A splenule.  Normal stomach, pancreas, gallbladder, biliary tract, adrenal glands, left kidney.  Too small to characterize right renal lesion.  No retroperitoneal or retrocrural adenopathy.  Underdistended rectum, including image 80/series 2. Colonic stool burden suggests constipation.  Normal appendix. Normal small bowel without abdominal ascites.    No pelvic adenopathy.  Normal urinary bladder.  Mild prostatomegaly. No significant free fluid.  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  A spinal  stimulator is identified.  IMPRESSION:  1. Possible constipation. 2.  No other explanation for pain. 3.  Possible distal esophageal wall thickening.  Question esophagitis. 4.  Mild hepatic steatosis.   Original Report Authenticated By: Jeronimo Greaves, M.D.    I personally reviewed the imaging tests through PACS system I reviewed available ER/hospitalization records through the EMR   1. Abdominal pain   2. Constipation       MDM  2:10 PM The patient feels better at this time.  Labs urine and CT scan are normal.  Discharge home in good condition.        Lyanne Co, MD 08/03/12 [phone]

## 2012-08-03 NOTE — ED Notes (Signed)
Pt arrives to ed c/o lower abd pain x 2days. Pt has halter monitor for "valve issue" being seen by [facility]. Pt sts urinary incontinence with hx of prostititis.  Last bm yesterday. Pt denies blood in stool, denies vomiting. Pt denies recent illness/injury. Caox3. NAD.

## 2012-08-12 DIAGNOSIS — M5417 Radiculopathy, lumbosacral region: Secondary | ICD-10-CM | POA: Insufficient documentation

## 2012-08-28 ENCOUNTER — Ambulatory Visit: Payer: Medicare Other | Admitting: Cardiology

## 2012-08-29 ENCOUNTER — Encounter: Payer: Self-pay | Admitting: Family Medicine

## 2012-09-10 ENCOUNTER — Encounter: Payer: Self-pay | Admitting: Family Medicine

## 2012-09-10 ENCOUNTER — Ambulatory Visit (INDEPENDENT_AMBULATORY_CARE_PROVIDER_SITE_OTHER): Payer: Medicare Other | Admitting: Family Medicine

## 2012-09-10 ENCOUNTER — Telehealth: Payer: Self-pay | Admitting: *Deleted

## 2012-09-10 VITALS — BP 164/90 | HR 81 | Temp 98.2°F | Ht 74.0 in | Wt 187.2 lb

## 2012-09-10 DIAGNOSIS — Z139 Encounter for screening, unspecified: Secondary | ICD-10-CM

## 2012-09-10 MED ORDER — METOPROLOL SUCCINATE ER 25 MG PO TB24: 25.0000 mg | ORAL_TABLET | Freq: Every day | ORAL | Status: DC

## 2012-09-10 NOTE — Assessment & Plan Note (Signed)
Pt using E cig

## 2012-09-10 NOTE — Assessment & Plan Note (Signed)
Check labs con't meds 

## 2012-09-10 NOTE — Assessment & Plan Note (Signed)
Elevated today Pt is in pain rcheck 3 months

## 2012-09-10 NOTE — Patient Instructions (Addendum)
Preventive Care for Adults, Male A healthy lifestyle and preventive care can promote health and wellness. Preventive health guidelines for men include the following key practices:  A routine yearly physical is a good way to check with your caregiver about your health and preventative screening. It is a chance to share any concerns and updates on your health, and to receive a thorough exam.  Visit your dentist for a routine exam and preventative care every 6 months. Brush your teeth twice a day and floss once a day. Good oral hygiene prevents tooth decay and gum disease.  The frequency of eye exams is based on your age, health, family medical history, use of contact lenses, and other factors. Follow your caregiver's recommendations for frequency of eye exams.  Eat a healthy diet. Foods like vegetables, fruits, whole grains, low-fat dairy products, and lean protein foods contain the nutrients you need without too many calories. Decrease your intake of foods high in solid fats, added sugars, and salt. Eat the right amount of calories for you.Get information about a proper diet from your caregiver, if necessary.  Regular physical exercise is one of the most important things you can do for your health. Most adults should get at least 150 minutes of moderate-intensity exercise (any activity that increases your heart rate and causes you to sweat) each week. In addition, most adults need muscle-strengthening exercises on 2 or more days a week.  Maintain a healthy weight. The body mass index (BMI) is a screening tool to identify possible weight problems. It provides an estimate of body fat based on height and weight. Your caregiver can help determine your BMI, and can help you achieve or maintain a healthy weight.For adults 20 years and older:  A BMI below 18.5 is considered underweight.  A BMI of 18.5 to 24.9 is normal.  A BMI of 25 to 29.9 is considered overweight.  A BMI of 30 and above is  considered obese.  Maintain normal blood lipids and cholesterol levels by exercising and minimizing your intake of saturated fat. Eat a balanced diet with plenty of fruit and vegetables. Blood tests for lipids and cholesterol should begin at age 20 and be repeated every 5 years. If your lipid or cholesterol levels are high, you are over 50, or you are a high risk for heart disease, you may need your cholesterol levels checked more frequently.Ongoing high lipid and cholesterol levels should be treated with medicines if diet and exercise are not effective.  If you smoke, find out from your caregiver how to quit. If you do not use tobacco, do not start.  If you choose to drink alcohol, do not exceed 2 drinks per day. One drink is considered to be 12 ounces (355 mL) of beer, 5 ounces (148 mL) of wine, or 1.5 ounces (44 mL) of liquor.  Avoid use of street drugs. Do not share needles with anyone. Ask for help if you need support or instructions about stopping the use of drugs.  High blood pressure causes heart disease and increases the risk of stroke. Your blood pressure should be checked at least every 1 to 2 years. Ongoing high blood pressure should be treated with medicines, if weight loss and exercise are not effective.  If you are 45 to 56 years old, ask your caregiver if you should take aspirin to prevent heart disease.  Diabetes screening involves taking a blood sample to check your fasting blood sugar level. This should be done once every 3 years,   after age 45, if you are within normal weight and without risk factors for diabetes. Testing should be considered at a younger age or be carried out more frequently if you are overweight and have at least 1 risk factor for diabetes.  Colorectal cancer can be detected and often prevented. Most routine colorectal cancer screening begins at the age of 50 and continues through age 75. However, your caregiver may recommend screening at an earlier age if you  have risk factors for colon cancer. On a yearly basis, your caregiver may provide home test kits to check for hidden blood in the stool. Use of a small camera at the end of a tube, to directly examine the colon (sigmoidoscopy or colonoscopy), can detect the earliest forms of colorectal cancer. Talk to your caregiver about this at age 50, when routine screening begins. Direct examination of the colon should be repeated every 5 to 10 years through age 75, unless early forms of pre-cancerous polyps or small growths are found.  Hepatitis C blood testing is recommended for all people born from 1945 through 1965 and any individual with known risks for hepatitis C.  Practice safe sex. Use condoms and avoid high-risk sexual practices to reduce the spread of sexually transmitted infections (STIs). STIs include gonorrhea, chlamydia, syphilis, trichomonas, herpes, HPV, and human immunodeficiency virus (HIV). Herpes, HIV, and HPV are viral illnesses that have no cure. They can result in disability, cancer, and death.  A one-time screening for abdominal aortic aneurysm (AAA) and surgical repair of large AAAs by sound wave imaging (ultrasonography) is recommended for ages 65 to 75 years who are current or former smokers.  Healthy men should no longer receive prostate-specific antigen (PSA) blood tests as part of routine cancer screening. Consult with your caregiver about prostate cancer screening.  Testicular cancer screening is not recommended for adult males who have no symptoms. Screening includes self-exam, caregiver exam, and other screening tests. Consult with your caregiver about any symptoms you have or any concerns you have about testicular cancer.  Use sunscreen with skin protection factor (SPF) of 30 or more. Apply sunscreen liberally and repeatedly throughout the day. You should seek shade when your shadow is shorter than you. Protect yourself by wearing long sleeves, pants, a wide-brimmed hat, and  sunglasses year round, whenever you are outdoors.  Once a month, do a whole body skin exam, using a mirror to look at the skin on your back. Notify your caregiver of new moles, moles that have irregular borders, moles that are larger than a pencil eraser, or moles that have changed in shape or color.  Stay current with required immunizations.  Influenza. You need a dose every fall (or winter). The composition of the flu vaccine changes each year, so being vaccinated once is not enough.  Pneumococcal polysaccharide. You need 1 to 2 doses if you smoke cigarettes or if you have certain chronic medical conditions. You need 1 dose at age 65 (or older) if you have never been vaccinated.  Tetanus, diphtheria, pertussis (Tdap, Td). Get 1 dose of Tdap vaccine if you are younger than age 65 years, are over 65 and have contact with an infant, are a healthcare worker, or simply want to be protected from whooping cough. After that, you need a Td booster dose every 10 years. Consult your caregiver if you have not had at least 3 tetanus and diphtheria-containing shots sometime in your life or have a deep or dirty wound.  HPV. This vaccine is recommended   for males 13 through 56 years of age. This vaccine may be given to men 22 through 56 years of age who have not completed the 3 dose series. It is recommended for men through age 26 who have sex with men or whose immune system is weakened because of HIV infection, other illness, or medications. The vaccine is given in 3 doses over 6 months.  Measles, mumps, rubella (MMR). You need at least 1 dose of MMR if you were born in 1957 or later. You may also need a 2nd dose.  Meningococcal. If you are age 19 to 21 years and a first-year college student living in a residence hall, or have one of several medical conditions, you need to get vaccinated against meningococcal disease. You may also need additional booster doses.  Zoster (shingles). If you are age 60 years or  older, you should get this vaccine.  Varicella (chickenpox). If you have never had chickenpox or you were vaccinated but received only 1 dose, talk to your caregiver to find out if you need this vaccine.  Hepatitis A. You need this vaccine if you have a specific risk factor for hepatitis A virus infection, or you simply wish to be protected from this disease. The vaccine is usually given as 2 doses, 6 to 18 months apart.  Hepatitis B. You need this vaccine if you have a specific risk factor for hepatitis B virus infection or you simply wish to be protected from this disease. The vaccine is given in 3 doses, usually over 6 months. Preventative Service / Frequency Ages 19 to 39  Blood pressure check.** / Every 1 to 2 years.  Lipid and cholesterol check.** / Every 5 years beginning at age 20.  Hepatitis C blood test.** / For any individual with known risks for hepatitis C.  Skin self-exam. / Monthly.  Influenza immunization.** / Every year.  Pneumococcal polysaccharide immunization.** / 1 to 2 doses if you smoke cigarettes or if you have certain chronic medical conditions.  Tetanus, diphtheria, pertussis (Tdap,Td) immunization. / A one-time dose of Tdap vaccine. After that, you need a Td booster dose every 10 years.  HPV immunization. / 3 doses over 6 months, if 26 and younger.  Measles, mumps, rubella (MMR) immunization. / You need at least 1 dose of MMR if you were born in 1957 or later. You may also need a 2nd dose.  Meningococcal immunization. / 1 dose if you are age 19 to 21 years and a first-year college student living in a residence hall, or have one of several medical conditions, you need to get vaccinated against meningococcal disease. You may also need additional booster doses.  Varicella immunization.** / Consult your caregiver.  Hepatitis A immunization.** / Consult your caregiver. 2 doses, 6 to 18 months apart.  Hepatitis B immunization.** / Consult your caregiver. 3 doses  usually over 6 months. Ages 40 to 64  Blood pressure check.** / Every 1 to 2 years.  Lipid and cholesterol check.** / Every 5 years beginning at age 20.  Fecal occult blood test (FOBT) of stool. / Every year beginning at age 50 and continuing until age 75. You may not have to do this test if you get colonoscopy every 10 years.  Flexible sigmoidoscopy** or colonoscopy.** / Every 5 years for a flexible sigmoidoscopy or every 10 years for a colonoscopy beginning at age 50 and continuing until age 75.  Hepatitis C blood test.** / For all people born from 1945 through 1965 and any   individual with known risks for hepatitis C.  Skin self-exam. / Monthly.  Influenza immunization.** / Every year.  Pneumococcal polysaccharide immunization.** / 1 to 2 doses if you smoke cigarettes or if you have certain chronic medical conditions.  Tetanus, diphtheria, pertussis (Tdap/Td) immunization.** / A one-time dose of Tdap vaccine. After that, you need a Td booster dose every 10 years.  Measles, mumps, rubella (MMR) immunization. / You need at least 1 dose of MMR if you were born in 1957 or later. You may also need a 2nd dose.  Varicella immunization.**/ Consult your caregiver.  Meningococcal immunization.** / Consult your caregiver.  Hepatitis A immunization.** / Consult your caregiver. 2 doses, 6 to 18 months apart.  Hepatitis B immunization.** / Consult your caregiver. 3 doses, usually over 6 months. Ages 65 and over  Blood pressure check.** / Every 1 to 2 years.  Lipid and cholesterol check.**/ Every 5 years beginning at age 20.  Fecal occult blood test (FOBT) of stool. / Every year beginning at age 50 and continuing until age 75. You may not have to do this test if you get colonoscopy every 10 years.  Flexible sigmoidoscopy** or colonoscopy.** / Every 5 years for a flexible sigmoidoscopy or every 10 years for a colonoscopy beginning at age 50 and continuing until age 75.  Hepatitis C blood  test.** / For all people born from 1945 through 1965 and any individual with known risks for hepatitis C.  Abdominal aortic aneurysm (AAA) screening.** / A one-time screening for ages 65 to 75 years who are current or former smokers.  Skin self-exam. / Monthly.  Influenza immunization.** / Every year.  Pneumococcal polysaccharide immunization.** / 1 dose at age 65 (or older) if you have never been vaccinated.  Tetanus, diphtheria, pertussis (Tdap, Td) immunization. / A one-time dose of Tdap vaccine if you are over 65 and have contact with an infant, are a healthcare worker, or simply want to be protected from whooping cough. After that, you need a Td booster dose every 10 years.  Varicella immunization. ** / Consult your caregiver.  Meningococcal immunization.** / Consult your caregiver.  Hepatitis A immunization. ** / Consult your caregiver. 2 doses, 6 to 18 months apart.  Hepatitis B immunization.** / Check with your caregiver. 3 doses, usually over 6 months. **Family history and personal history of risk and conditions may change your caregiver's recommendations. Document Released: 08/29/2001 Document Revised: 09/25/2011 Document Reviewed: 11/28/2010 [employer] Patient Information 2013 [employer].  

## 2012-09-10 NOTE — Telephone Encounter (Signed)
Dr Shirlee Latch reviewed monitor 07/31/12 --episode of accelerated idioventricular rhythm. Do not think it is dangerous for him but if feels palpitations can use Toprol XL 25mg  daily. I spoke with pt and he does have occ palpitations during the day. He wants to try Toprol XL. He will keep his appt already scheduled with Dr Shirlee Latch 09/30/12.

## 2012-09-10 NOTE — Assessment & Plan Note (Signed)
Per pain management.  

## 2012-09-10 NOTE — Progress Notes (Signed)
Subjective:    Willie Andrews is a 57 y.o. male who presents for a welcome to Medicare exam.   Cardiac risk factors: dyslipidemia, hypertension, male gender, sedentary lifestyle and smoking/ tobacco exposure.  Depression Screen (Note: if answer to either of the following is "Yes", a more complete depression screening is indicated)  Q1: Over the past two weeks, have you felt down, depressed or hopeless? no Q2: Over the past two weeks, have you felt little interest or pleasure in doing things? no  Activities of Daily Living In your present state of health, do you have any difficulty performing the following activities?:  Preparing food and eating?: No Bathing yourself: No Getting dressed: No Using the toilet:No Moving around from place to place: No In the past year have you fallen or had a near fall?:No  Current exercise habits: The patient does not participate in regular exercise at present.  Dietary issues discussed: na  Hearing difficulties: No Safe in current home environment: yes  The following portions of the patient's history were reviewed and updated as appropriate:  He  has a past medical history of Arthritis; Depression; High cholesterol; Kidney stones; Migraine; Benign prostatic hypertrophy; Hypertension; Seasonal allergies; Sleep apnea; and Chronic pain. He  does not have any pertinent problems on file. He  has past surgical history that includes L5-S1; Elbow surgery; Neck surgery; Shoulder surgery; Back surgery; Nasal sinus surgery; Back surgery (2012); and Spine surgery ([id number]). His family history includes Alcohol abuse in an unspecified family member; Arthritis in an unspecified family member; and Cancer in his mother. He  reports that he has been smoking.  He does not have any smokeless tobacco history on file. He reports that  drinks alcohol. He reports that he does not use illicit drugs. He has a current medication list which includes the following prescription(s):  androgel pump, aspirin ec, diphenhydramine, lisinopril, melatonin, oxycodone, oxycodone-acetaminophen, pravastatin, and rizatriptan. Current Outpatient Prescriptions on File Prior to Visit  Medication Sig Dispense Refill  . aspirin EC 81 MG tablet Take 1 tablet (81 mg total) by mouth daily.      . diphenhydrAMINE (BENADRYL) 25 mg capsule Take 50 mg by mouth at bedtime as needed. For sleep      . lisinopril (PRINIVIL,ZESTRIL) 20 MG tablet Take 20 mg by mouth daily.      . Melatonin 1 MG CAPS Take 2 mg by mouth at bedtime.      . OxyCODONE (OXYCONTIN) 20 mg T12A Take 20 mg by mouth every 8 (eight) hours.      Marland Kitchen oxyCODONE-acetaminophen (PERCOCET) 10-325 MG per tablet Take 1 tablet by mouth every 8 (eight) hours as needed. For pain      . pravastatin (PRAVACHOL) 20 MG tablet Take 20 mg by mouth daily.      . rizatriptan (MAXALT) 10 MG tablet Take 10 mg by mouth as needed. May repeat in 2 hours if needed no more than 2 tablet in 24 hours For migraines      . [DISCONTINUED] vardenafil (LEVITRA) 20 MG tablet Take 20 mg by mouth as directed.         No current facility-administered medications on file prior to visit.   He is allergic to butrans; ketorolac tromethamine; and opana.. Review of Systems  Review of Systems  Constitutional: Negative for activity change, appetite change and fatigue.  HENT: Negative for hearing loss, congestion, tinnitus and ear discharge.   Eyes: Negative for visual disturbance (see optho q1y -- vision corrected to  20/20 with glasses).  Respiratory: Negative for cough, chest tightness and shortness of breath.   Cardiovascular: Negative for chest pain, palpitations and leg swelling.  Gastrointestinal: Negative for abdominal pain, diarrhea, constipation and abdominal distention.  Genitourinary: Negative for urgency, frequency, decreased urine volume and difficulty urinating.  Musculoskeletal:+ back and neck pain Skin: Negative for color change, pallor and rash.   Neurological: Negative for dizziness, light-headedness, numbness and headaches.  Hematological: Negative for adenopathy. Does not bruise/bleed easily.  Psychiatric/Behavioral: Negative for suicidal ideas, confusion, sleep disturbance, self-injury, dysphoric mood, decreased concentration and agitation.  Pt is able to read and write and can do all ADLs No risk for falling No abuse/ violence in home      Objective:     Vision by Snellen chart: opth Blood pressure 164/90, pulse 81, temperature 98.2 F (36.8 C), temperature source Oral, height 6\' 2"  (1.88 m), weight 187 lb 3.2 oz (84.913 kg), SpO2 97.00%. Body mass index is 24.02 kg/(m^2). BP 164/90  Pulse 81  Temp(Src) 98.2 F (36.8 C) (Oral)  Ht 6\' 2"  (1.88 m)  Wt 187 lb 3.2 oz (84.913 kg)  BMI 24.02 kg/m2  SpO2 97% General appearance: alert, cooperative, appears stated age and no distress Head: Normocephalic, without obvious abnormality, atraumatic Eyes: conjunctivae/corneas clear. PERRL, EOM's intact. Fundi benign. Ears: normal TM's and external ear canals both ears Nose: Nares normal. Septum midline. Mucosa normal. No drainage or sinus tenderness. Throat: lips, mucosa, and tongue normal; teeth and gums normal Neck: no adenopathy, no carotid bruit, no JVD, supple, symmetrical, trachea midline and thyroid not enlarged, symmetric, no tenderness/mass/nodules Back: slow moving secondary to pain in neck and back ---no change Lungs: clear to auscultation bilaterally Chest wall: no tenderness Heart: regular rate and rhythm, S1, S2 normal, no murmur, click, rub or gallop Abdomen: soft, non-tender; bowel sounds normal; no masses,  no organomegaly Male genitalia: deferred---pt refused Rectal: deferred---pt refused Extremities: extremities normal, atraumatic, no cyanosis or edema Pulses: 2+ and symmetric Skin: Skin color, texture, turgor normal. No rashes or lesions Lymph nodes: Cervical, supraclavicular, and axillary nodes  normal. Neurologic: Alert and oriented X 3, normal strength and tone. Normal symmetric reflexes. Normal coordination and gait Psych--no depression, anxiety      Assessment:  cpe    Plan:     During the course of the visit the patient was educated and counseled about appropriate screening and preventive services including:   Influenza vaccine  Td vaccine  Screening electrocardiogram  Prostate cancer screening  Colorectal cancer screening  Diabetes screening  Glaucoma screening  Smoking cessation counseling  Advanced directives: has an advanced directive - a copy HAS NOT been provided.  Patient Instructions (the written plan) was given to the patient.

## 2012-09-12 ENCOUNTER — Encounter: Payer: Self-pay | Admitting: Family Medicine

## 2012-09-16 ENCOUNTER — Other Ambulatory Visit: Payer: Self-pay | Admitting: Family Medicine

## 2012-09-16 ENCOUNTER — Other Ambulatory Visit (INDEPENDENT_AMBULATORY_CARE_PROVIDER_SITE_OTHER): Payer: Medicare Other

## 2012-09-16 LAB — LIPID PANEL
HDL: 37.9 mg/dL — ABNORMAL LOW (ref >39.00)
Total CHOL/HDL Ratio: 4

## 2012-09-16 LAB — HEPATIC FUNCTION PANEL
ALT: 25 U/L (ref 0–53)
Alkaline Phosphatase: 77 U/L (ref 39–117)
Bilirubin, Direct: 0.1 mg/dL (ref 0.0–0.3)
Total Protein: 7.3 g/dL (ref 6.0–8.3)

## 2012-09-16 LAB — BASIC METABOLIC PANEL
CO2: 24 mEq/L (ref 19–32)
Chloride: 105 mEq/L (ref 96–112)
Sodium: 139 mEq/L (ref 135–145)

## 2012-09-16 LAB — PSA: PSA: 1.1 ng/mL (ref 0.10–4.00)

## 2012-09-17 LAB — TESTOSTERONE, FREE, TOTAL, SHBG
Sex Hormone Binding: 40 nmol/L (ref 13–71)
Testosterone, Free: 64.8 pg/mL (ref 47.0–244.0)

## 2012-09-19 ENCOUNTER — Telehealth: Payer: Self-pay | Admitting: *Deleted

## 2012-09-19 NOTE — Telephone Encounter (Signed)
Pt left VM that he has questions about his lab results. Left message to call office.

## 2012-09-24 NOTE — Telephone Encounter (Signed)
LMTCB on VM

## 2012-09-25 NOTE — Telephone Encounter (Signed)
See labs encounter

## 2012-09-30 ENCOUNTER — Encounter: Payer: Self-pay | Admitting: Cardiology

## 2012-09-30 ENCOUNTER — Other Ambulatory Visit: Payer: Self-pay | Admitting: Family Medicine

## 2012-09-30 ENCOUNTER — Ambulatory Visit (INDEPENDENT_AMBULATORY_CARE_PROVIDER_SITE_OTHER): Payer: Medicare Other | Admitting: Cardiology

## 2012-09-30 VITALS — BP 160/80 | HR 80 | Ht 74.0 in | Wt 188.0 lb

## 2012-09-30 DIAGNOSIS — G4700 Insomnia, unspecified: Secondary | ICD-10-CM

## 2012-09-30 DIAGNOSIS — R002 Palpitations: Secondary | ICD-10-CM

## 2012-09-30 MED ORDER — LISINOPRIL 40 MG PO TABS: 40.0000 mg | ORAL_TABLET | Freq: Every day | ORAL | Status: DC

## 2012-09-30 MED ORDER — OMEPRAZOLE 40 MG PO CPDR: 40.0000 mg | DELAYED_RELEASE_CAPSULE | Freq: Every day | ORAL | Status: DC

## 2012-09-30 NOTE — Patient Instructions (Addendum)
Your physician recommends that you have lab work today for [facility].  You need to have lab work in 2 weeks for [employer].  Your physician has recommended you make the following change in your medication: Increase your Lisinopril to 40 mg once daily.   Begin taking Omeprazole 40 mg once daily.  You have been referred to Pulmonology to get you back on your CPAP.  Your physician recommends that you schedule a follow-up appointment in: 2 months with Dr. Shirlee Latch

## 2012-09-30 NOTE — Progress Notes (Signed)
Patient ID: Willie Andrews, male   DOB: [birth year], 57 y.o.   MRN: [id number] PCP: Dr. Laury Axon  57 yo with history of HTN, smoking, low back pain on disability, and hyperlipidemia presents for cardiology evaluation.  At last appointment, he was having increased palpitations.  I had him wear an event monitor in 1/14, which showed a short episode of idioventricular rhythm, otherwise no significant arrhythmias.  He is not very active due to back pain.  He follows in a pain clinic.  No exertional dyspnea or chest pain.  He does get short of breath when lying down in bed at night and coughs/wheezes lying in bed at night.  He does get reflux symptoms as well (belching, acid taste in mouth) when lying in bed at night.  He is always sleepy.  He does not get much sleep due to back pain as well as dyspnea when lyng down.  He currently does not have CPAP though he has been diagnosed with OSA.  He is down to smoking 10-12 cigs/day using an electronic cigarette. BP has been running high.   Labs (12/13): K 3.8, creatinine 1.0, TSH normal, TGs 391, HDL 36, LDL 109  PMH: 1. HTN 2. Hyperlipidemia 3. Active smoker 4. Depression 5. Migraines 6. BPH 7. OSA 8. Low back pain s/p L-spine surgery.  9. Aortic insufficiency: Possibly due to HTN.  Echo (12/13) with EF 55-60%, moderate LVH, grade I diastolic dysfunction, mild to moderate AI, trileaflet aortic valve.   10. Palpitations: Event monitor 1/14 with no atrial fibrillation.  There was a short run of idioventricular rhythm.   SH: Separated, smokes <1 ppd.  On disability from back pain.  Lives in [place].   FH: Uncle with CABG.  Cousin with atrial fibrillation.   ROS: All systems reviewed and negative except as per HPI.   Current Outpatient Prescriptions  Medication Sig Dispense Refill  . ANDROGEL PUMP 20.25 MG/ACT (1.62%) GEL       . aspirin EC 81 MG tablet Take 1 tablet (81 mg total) by mouth daily.      . diphenhydrAMINE (BENADRYL) 25 mg capsule Take 50 mg  by mouth at bedtime as needed. For sleep      . Melatonin 1 MG CAPS Take 2 mg by mouth at bedtime.      . metoprolol succinate (TOPROL XL) 25 MG 24 hr tablet Take 1 tablet (25 mg total) by mouth daily.  30 tablet  6  . OxyCODONE (OXYCONTIN) 20 mg T12A Take 20 mg by mouth every 8 (eight) hours.      Marland Kitchen oxyCODONE-acetaminophen (PERCOCET) 10-325 MG per tablet Take 1 tablet by mouth every 8 (eight) hours as needed. For pain      . pravastatin (PRAVACHOL) 20 MG tablet Take 20 mg by mouth daily.      . rizatriptan (MAXALT) 10 MG tablet Take 10 mg by mouth as needed. May repeat in 2 hours if needed no more than 2 tablet in 24 hours For migraines      . lisinopril (PRINIVIL,ZESTRIL) 40 MG tablet Take 1 tablet (40 mg total) by mouth daily.  90 tablet  3  . omeprazole (PRILOSEC) 40 MG capsule Take 1 capsule (40 mg total) by mouth daily.  90 capsule  3  . pravastatin (PRAVACHOL) 20 MG tablet TAKE ONE TABLET BY MOUTH EVERY DAY  30 tablet  5  . [DISCONTINUED] vardenafil (LEVITRA) 20 MG tablet Take 20 mg by mouth as directed.  No current facility-administered medications for this visit.    BP 160/80  Pulse 80  Ht 6\' 2"  (1.88 m)  Wt 188 lb (85.276 kg)  BMI 24.13 kg/m2  SpO2 98% General: NAD Neck: No JVD, no thyromegaly or thyroid nodule.  Lungs: Clear to auscultation bilaterally with normal respiratory effort. CV: Nondisplaced PMI.  Heart regular S1/S2, no S3/S4, 1/6 SEM RUSB.  No peripheral edema.  No carotid bruit.  Normal pedal pulses.  Abdomen: Soft, nontender, no hepatosplenomegaly, no distention.  Neurologic: Alert and oriented x 3.  Psych: Normal affect. Extremities: No clubbing or cyanosis.   Assessment/Plan: 1. HTN: Running high.  I will increase lisinopril to 40 mg daily with BMET in 2 wks.  2. Aortic insufficiency: Mild to moderate on recent echo.  The aortic valve is trileaflet.  The AI may be related to HTN.  Continue BP control.   Will plan to repeat an echo in 2 years.   3.  Palpitations: 2 week event monitor showed no atrial fibrillation but did show a short run of idioventricular rhythm.  Palpitations improved on Toprol XL.   4. Smoking: I strongly encouraged him to quit. He is cutting back with electronic cigarette. 5. Dyspnea when lying down: He is not volume overloaded on exam and does not have dyspnea on exertion, so think this is less likely to be orthopnea.  It is associated with wheezing and coughing.  He also gets belching and an acid taste in his mouth.  I am concerned that this may be GERD.  - Check BNP  - Start omeprazole 40 mg daily in the evening to see if resolving reflux can resolve his nocturnal symptoms.  6. OSA: I am going to refer him to pulmonary to get back on CPAP.  Marca Ancona 09/30/2012

## 2012-10-01 ENCOUNTER — Telehealth: Payer: Self-pay | Admitting: Cardiology

## 2012-10-01 NOTE — Telephone Encounter (Signed)
?   Out of order

## 2012-10-01 NOTE — Telephone Encounter (Signed)
Unable to reach pt at this number.

## 2012-10-01 NOTE — Telephone Encounter (Signed)
New Problem:    Patient called in returning your call. Please call back. 

## 2012-10-02 NOTE — Telephone Encounter (Signed)
NA

## 2012-10-07 NOTE — Telephone Encounter (Signed)
Unable to reach pat by telephone

## 2012-10-07 NOTE — Telephone Encounter (Signed)
I had been trying to get in touch with pt to let him know BNP done 09/30/12 was normal

## 2012-10-11 ENCOUNTER — Ambulatory Visit: Payer: Medicare Other | Admitting: Pulmonary Disease

## 2012-10-14 ENCOUNTER — Ambulatory Visit: Payer: Medicare Other | Admitting: Family Medicine

## 2012-10-21 ENCOUNTER — Ambulatory Visit: Payer: Self-pay | Admitting: Family Medicine

## 2012-10-23 ENCOUNTER — Ambulatory Visit (INDEPENDENT_AMBULATORY_CARE_PROVIDER_SITE_OTHER): Payer: Medicare Other | Admitting: Family Medicine

## 2012-10-23 ENCOUNTER — Encounter: Payer: Self-pay | Admitting: Family Medicine

## 2012-10-23 VITALS — BP 140/74 | HR 80 | Temp 98.3°F | Wt 189.6 lb

## 2012-10-23 DIAGNOSIS — I10 Essential (primary) hypertension: Secondary | ICD-10-CM

## 2012-10-23 LAB — BASIC METABOLIC PANEL
CO2: 28 mEq/L (ref 19–32)
Calcium: 9 mg/dL (ref 8.4–10.5)
Chloride: 104 mEq/L (ref 96–112)
Creatinine, Ser: 0.9 mg/dL (ref 0.4–1.5)

## 2012-10-23 NOTE — Progress Notes (Signed)
  Subjective:    Patient here for follow-up of elevated blood pressure.  He is not exercising and is adherent to a low-salt diet.  Blood pressure is well controlled at home. Cardiac symptoms: none. Patient denies: chest pain, chest pressure/discomfort, claudication, dyspnea, exertional chest pressure/discomfort, fatigue, irregular heart beat, lower extremity edema, near-syncope, orthopnea, palpitations, paroxysmal nocturnal dyspnea, syncope and tachypnea. Cardiovascular risk factors: advanced age (older than 60 for men, 72 for women), dyslipidemia, hypertension, male gender and sedentary lifestyle. Use of agents associated with hypertension: none. History of target organ damage: none.  The following portions of the patient's history were reviewed and updated as appropriate: allergies, current medications, past family history, past medical history, past social history, past surgical history and problem list.  Review of Systems Pertinent items are noted in HPI.     Objective:    BP 140/74  Pulse 80  Temp(Src) 98.3 F (36.8 C) (Oral)  Wt 189 lb 9.6 oz (86.002 kg)  BMI 24.33 kg/m2  SpO2 98% General appearance: alert, cooperative, appears stated age and no distress Neck: no adenopathy, supple, symmetrical, trachea midline and thyroid not enlarged, symmetric, no tenderness/mass/nodules Lungs: clear to auscultation bilaterally Heart: S1, S2 normal Extremities: extremities normal, atraumatic, no cyanosis or edema    Assessment:    Hypertension, normal blood pressure . Evidence of target organ damage: none.  Insomnia----  Secondary to pain--- he will discuss with pain management Plan:    Medication: no change. Dietary sodium restriction. Regular aerobic exercise. Check blood pressures 2-3 times weekly and record. Follow up: 6 months and as needed.  Check bmp

## 2012-10-23 NOTE — Patient Instructions (Addendum)

## 2012-11-04 ENCOUNTER — Telehealth: Payer: Self-pay | Admitting: *Deleted

## 2012-11-04 DIAGNOSIS — R739 Hyperglycemia, unspecified: Secondary | ICD-10-CM

## 2012-11-04 NOTE — Telephone Encounter (Signed)
Spoke with the pt and informed him of recent lab results and note.  Pt understood and agreed to come in to have bloodwork done.  Scheduled pt for Thurs. 4/224/14 @ 9:30am.  Labs ordered and sent.//AB/CMA

## 2012-11-04 NOTE — Telephone Encounter (Signed)
Message copied by Verdie Shire on Mon Nov 04, 2012  1:37 PM ------      Message from: Lelon Perla      Created: Sun Oct 27, 2012  1:57 PM       Glucose elevated---  Recheck fasting and add [id number]      Bmp, hgba1c    Dx hyperglycemia ------

## 2012-11-07 ENCOUNTER — Other Ambulatory Visit: Payer: Medicare Other

## 2012-11-08 ENCOUNTER — Other Ambulatory Visit (INDEPENDENT_AMBULATORY_CARE_PROVIDER_SITE_OTHER): Payer: Medicare Other

## 2012-11-08 DIAGNOSIS — R739 Hyperglycemia, unspecified: Secondary | ICD-10-CM

## 2012-11-08 DIAGNOSIS — R7309 Other abnormal glucose: Secondary | ICD-10-CM

## 2012-11-08 LAB — BASIC METABOLIC PANEL
Chloride: 104 mEq/L (ref 96–112)
GFR: 81.31 mL/min (ref >60.00)
Glucose, Bld: 82 mg/dL (ref 70–99)
Potassium: 4.7 mEq/L (ref 3.5–5.1)
Sodium: 140 mEq/L (ref 135–145)

## 2012-11-08 LAB — HEMOGLOBIN A1C: Hgb A1c MFr Bld: 6.2 % (ref 4.6–6.5)

## 2012-11-08 NOTE — Progress Notes (Signed)
LABS ONLY  

## 2012-11-13 ENCOUNTER — Encounter: Payer: Self-pay | Admitting: *Deleted

## 2012-11-18 ENCOUNTER — Telehealth: Payer: Self-pay | Admitting: Family Medicine

## 2012-11-18 DIAGNOSIS — R252 Cramp and spasm: Secondary | ICD-10-CM | POA: Insufficient documentation

## 2012-11-18 NOTE — Telephone Encounter (Signed)
Pt would like someone to call him about the test results that he did about two weeks ago

## 2012-11-18 NOTE — Telephone Encounter (Signed)
Discuss with patient   Notes Recorded by Lelon Perla, DO on 11/12/2012 at 5:24 PM great

## 2012-12-05 ENCOUNTER — Ambulatory Visit: Payer: Medicare Other | Admitting: Cardiology

## 2012-12-12 ENCOUNTER — Encounter: Payer: Self-pay | Admitting: Cardiology

## 2013-01-22 ENCOUNTER — Ambulatory Visit: Payer: Medicare Other | Admitting: Family Medicine

## 2013-01-23 ENCOUNTER — Ambulatory Visit (INDEPENDENT_AMBULATORY_CARE_PROVIDER_SITE_OTHER): Payer: Medicare Other | Admitting: Family Medicine

## 2013-01-23 ENCOUNTER — Encounter: Payer: Self-pay | Admitting: Family Medicine

## 2013-01-23 VITALS — BP 134/70 | HR 77 | Temp 98.7°F | Wt 183.6 lb

## 2013-01-23 DIAGNOSIS — I10 Essential (primary) hypertension: Secondary | ICD-10-CM

## 2013-01-23 DIAGNOSIS — R319 Hematuria, unspecified: Secondary | ICD-10-CM

## 2013-01-23 DIAGNOSIS — N529 Male erectile dysfunction, unspecified: Secondary | ICD-10-CM

## 2013-01-23 DIAGNOSIS — E785 Hyperlipidemia, unspecified: Secondary | ICD-10-CM

## 2013-01-23 DIAGNOSIS — R29890 Loss of height: Secondary | ICD-10-CM

## 2013-01-23 DIAGNOSIS — G894 Chronic pain syndrome: Secondary | ICD-10-CM

## 2013-01-23 LAB — BASIC METABOLIC PANEL
CO2: 33 mEq/L — ABNORMAL HIGH (ref 19–32)
Calcium: 8.9 mg/dL (ref 8.4–10.5)
Chloride: 104 mEq/L (ref 96–112)
Creatinine, Ser: 0.9 mg/dL (ref 0.4–1.5)
Glucose, Bld: 92 mg/dL (ref 70–99)
Potassium: 4.5 mEq/L (ref 3.5–5.1)

## 2013-01-23 LAB — HEPATIC FUNCTION PANEL
ALT: 16 U/L (ref 0–53)
AST: 17 U/L (ref 0–37)
Bilirubin, Direct: 0.1 mg/dL (ref 0.0–0.3)
Total Bilirubin: 0.6 mg/dL (ref 0.3–1.2)
Total Protein: 7.2 g/dL (ref 6.0–8.3)

## 2013-01-23 LAB — LIPID PANEL
Total CHOL/HDL Ratio: 5
VLDL: 28.8 mg/dL (ref 0.0–40.0)

## 2013-01-23 MED ORDER — METOPROLOL TARTRATE 25 MG PO TABS: 25.0000 mg | ORAL_TABLET | Freq: Two times a day (BID) | ORAL | Status: DC

## 2013-01-23 MED ORDER — TADALAFIL 5 MG PO TABS: ORAL_TABLET | ORAL | Status: DC

## 2013-01-23 MED ORDER — LOVASTATIN 20 MG PO TABS: 20.0000 mg | ORAL_TABLET | Freq: Every day | ORAL | Status: DC

## 2013-01-23 NOTE — Progress Notes (Signed)
  Subjective:    Willie Andrews is a 57 y.o. male here for follow up of dyslipidemia. The patient does not use medications that may worsen dyslipidemias (corticosteroids, progestins, anabolic steroids, diuretics, beta-blockers, amiodarone, cyclosporine, olanzapine). The patient exercises never. The patient is not known to have coexisting coronary artery disease.   Cardiac Risk Factors Age > 45-male, > 55-male:  YES  +1  Smoking:   YES  +1  Sig. family hx of CHD*:  NO  Hypertension:   YES  +1  Diabetes:   NO  HDL < 35:   NO  HDL > 59:   NO  Total: 3   *- Sig. family h/o CHD per NCEP = MI or sudden death at <55yo in  father or other 1st-degree male relative, or <65yo in mother or  other 1st-degree male relative  The following portions of the patient's history were reviewed and updated as appropriate: allergies, current medications, past family history, past medical history, past social history, past surgical history and problem list.  Review of Systems Pertinent items are noted in HPI.    Objective:    BP 134/70  Pulse 77  Temp(Src) 98.7 F (37.1 C) (Oral)  Wt 183 lb 9.6 oz (83.28 kg)  BMI 23.56 kg/m2  SpO2 95% General appearance: alert, cooperative, appears stated age and no distress Nose: Nares normal. Septum midline. Mucosa normal. No drainage or sinus tenderness. Throat: lips, mucosa, and tongue normal; teeth and gums normal Lungs: clear to auscultation bilaterally Extremities: extremities normal, atraumatic, no cyanosis or edema Cor-- + S1, + S2   Lab Review Lab Results  Component Value Date   CHOL 163 09/16/2012   CHOL 197 06/24/2012   CHOL 239* 10/06/2011   HDL 37.90* 09/16/2012   HDL 16.10* 06/24/2012   HDL 45.50 10/06/2011   LDLDIRECT 109.0 06/24/2012   LDLDIRECT 125.5 10/06/2011   LDLDIRECT 128.8 09/02/2010      Assessment:    Dyslipidemia as detailed above with 3 CHD risk factors using NCEP scheme above.  Target levels for LDL are: < 100 mg/dl (CHD or "CHD  risk equivalent" is present)  Explained to the patient the respective contributions of genetics, diet, and exercise to lipid levels and the use of medication in severe cases which do not respond to lifestyle alteration. The patient's interest and motivation in making lifestyle changes seems good.    Plan:    The following changes are planned for the next 6 months, at which time the patient will return for repeat fasting lipids:  1. Dietary changes: Increase soluble fiber 2. Exercise changes:  unable to exercise secondary to pain 3. Other treatment: Smoking cessation (-) 4. Lipid-lowering medications: lovastatin 20  (Recommended by NCEP after 3-6 mos of dietary therapy & lifestyle modification,  except if CHD is present or LDL well above 190.) 5. Hormone replacement therapy (patient is a postmenopausal  woman): na 6. Screening for secondary causes of dyslipidemias: None indicated 7. Lipid screening for relatives: na 8. Follow up: 6 months.  Note: The majority of the visit was spent in counseling on the pathophysiology and treatment of dyslipidemias. The total face-to-face time was in excess of 25 minutes.

## 2013-01-23 NOTE — Patient Instructions (Addendum)

## 2013-01-25 NOTE — Assessment & Plan Note (Signed)
rx cialis  rto prn Pt does not want to see urology or take testosterone for low T

## 2013-01-25 NOTE — Assessment & Plan Note (Signed)
See meds 

## 2013-02-21 DIAGNOSIS — M47812 Spondylosis without myelopathy or radiculopathy, cervical region: Secondary | ICD-10-CM | POA: Insufficient documentation

## 2013-02-21 DIAGNOSIS — G479 Sleep disorder, unspecified: Secondary | ICD-10-CM | POA: Insufficient documentation

## 2013-02-25 LAB — POCT URINALYSIS DIPSTICK
Ketones, UA: NEGATIVE
Leukocytes, UA: NEGATIVE
Nitrite, UA: NEGATIVE
Protein, UA: NEGATIVE

## 2013-02-25 NOTE — Addendum Note (Signed)
Addended by: Silvio Pate D on: 02/25/2013 03:20 PM   Modules accepted: Orders

## 2013-02-25 NOTE — Addendum Note (Signed)
Addended by: Silvio Pate D on: 02/25/2013 03:15 PM   Modules accepted: Orders

## 2013-02-27 LAB — URINE CULTURE: Colony Count: NO GROWTH

## 2013-03-12 ENCOUNTER — Encounter (HOSPITAL_COMMUNITY): Payer: Self-pay | Admitting: *Deleted

## 2013-03-12 ENCOUNTER — Emergency Department (HOSPITAL_COMMUNITY)
Admission: EM | Admit: 2013-03-12 | Discharge: 2013-03-12 | Disposition: A | Discharge status: Home / Self Care | Payer: Medicare Other | Attending: Emergency Medicine | Admitting: Emergency Medicine

## 2013-03-12 DIAGNOSIS — G8929 Other chronic pain: Secondary | ICD-10-CM | POA: Insufficient documentation

## 2013-03-12 DIAGNOSIS — F329 Major depressive disorder, single episode, unspecified: Secondary | ICD-10-CM | POA: Insufficient documentation

## 2013-03-12 DIAGNOSIS — G43909 Migraine, unspecified, not intractable, without status migrainosus: Secondary | ICD-10-CM | POA: Insufficient documentation

## 2013-03-12 DIAGNOSIS — Z87448 Personal history of other diseases of urinary system: Secondary | ICD-10-CM | POA: Insufficient documentation

## 2013-03-12 DIAGNOSIS — M545 Low back pain, unspecified: Secondary | ICD-10-CM

## 2013-03-12 DIAGNOSIS — E7800 Pure hypercholesterolemia, unspecified: Secondary | ICD-10-CM | POA: Insufficient documentation

## 2013-03-12 DIAGNOSIS — Z87442 Personal history of urinary calculi: Secondary | ICD-10-CM | POA: Insufficient documentation

## 2013-03-12 DIAGNOSIS — G894 Chronic pain syndrome: Secondary | ICD-10-CM

## 2013-03-12 DIAGNOSIS — M129 Arthropathy, unspecified: Secondary | ICD-10-CM | POA: Insufficient documentation

## 2013-03-12 DIAGNOSIS — I10 Essential (primary) hypertension: Secondary | ICD-10-CM | POA: Insufficient documentation

## 2013-03-12 DIAGNOSIS — Z7982 Long term (current) use of aspirin: Secondary | ICD-10-CM | POA: Insufficient documentation

## 2013-03-12 DIAGNOSIS — F17200 Nicotine dependence, unspecified, uncomplicated: Secondary | ICD-10-CM | POA: Insufficient documentation

## 2013-03-12 DIAGNOSIS — F3289 Other specified depressive episodes: Secondary | ICD-10-CM | POA: Insufficient documentation

## 2013-03-12 MED ORDER — HYDROMORPHONE HCL PF 2 MG/ML IJ SOLN
2.0000 mg | Freq: Once | INTRAMUSCULAR | Status: AC
  Administered 2013-03-12: 2 mg via INTRAMUSCULAR
  Filled 2013-03-12: qty 1

## 2013-03-12 MED ORDER — DIAZEPAM 5 MG PO TABS
10.0000 mg | ORAL_TABLET | Freq: Once | ORAL | Status: AC
  Administered 2013-03-12: 10 mg via ORAL
  Filled 2013-03-12: qty 2

## 2013-03-12 MED ORDER — OXYCODONE-ACETAMINOPHEN 5-325 MG PO TABS
2.0000 | ORAL_TABLET | Freq: Once | ORAL | Status: AC
  Administered 2013-03-12: 2 via ORAL
  Filled 2013-03-12: qty 2

## 2013-03-12 MED ORDER — DIAZEPAM 5 MG PO TABS: 5.0000 mg | ORAL_TABLET | Freq: Four times a day (QID) | ORAL | Status: DC | PRN

## 2013-03-12 NOTE — ED Notes (Signed)
Pt has chronic back pain from DDD. Goes to a pain clinic in [facility] and has a pain med RX that cannot be filled til Saturday and is out of pain medicine.

## 2013-03-12 NOTE — ED Notes (Signed)
Pt in c/o back pain, states he has chronic back pain with multiple surgeries in the past, states pain has increased since he bent over a few days ago, pt is seen at a pain clinic in [place] and was told to come here

## 2013-03-12 NOTE — ED Provider Notes (Signed)
CSN: [id number]     Arrival date & time 03/12/13  1831 History  This chart was scribed for Dierdre Forth, PA working with Shon Baton, MD by Quintella Reichert, ED Scribe. This patient was seen in room TR06C/TR06C and the patient's care was started at 8:35 PM.    Chief Complaint  Patient presents with  . Back Pain    The history is provided by the patient. No language interpreter was used.    HPI Comments: Willie Andrews is a 57 y.o. male with h/o chronic back pain, arthritis, HTN and high cholesterol who presents to the Emergency Department complaining of 3 days of acute exacerbation of his chronic lower back pain.  Pt states he has had chronic back pain for several years and has had 2 surgeries to his back and 2 to his neck as well as a stimulator implant in his back placed in 2012.  He states that since his implant was placed his pain had generally been at a severity of 4/10.  3 days ago he bent over and when he tried to straighten back up "something grabbed me in my back" and "my pain level shot through the roof."  Since then he has had constant, severe lower back pain radiating into both upper legs.  He states that his pain is the same in quality and location as his chronic pain and feels the same in severity as it did before his first surgery.  He has attempted to treat pain with Percocet, without relief.  He called his pain management clinic in [facility] today and was advised to come to the ED because he may not be able to transport himself to [facility].  Pt denies any falls, injuries, or other activities that may have brought on his exacerbation.     Past Medical History  Diagnosis Date  . Arthritis   . Depression   . High cholesterol   . Kidney stones   . Migraine   . Benign prostatic hypertrophy     Dr. Earlene Plater  . Hypertension   . Seasonal allergies   . Sleep apnea   . Chronic pain     Past Surgical History  Procedure Laterality Date  . L5-s1    . Elbow  surgery      Left 2008  . Neck surgery      Disc 2002 and 2008  . Shoulder surgery      Right  . Back surgery      1989, 1994, 2002, 2008  . Nasal sinus surgery      2004  . Back surgery  2012    Neuro Implant --elect. stimulator  . Spine surgery  85 94    Family History  Problem Relation Age of Onset  . Arthritis    . Alcohol abuse    . Cancer Mother     unknown type    History  Substance Use Topics  . Smoking status: Current Every Day Smoker -- 1.50 packs/day  . Smokeless tobacco: Not on file  . Alcohol Use: Yes     Comment: 1 drink per night     Review of Systems  Constitutional: Negative for fever, diaphoresis, appetite change, fatigue and unexpected weight change.  HENT: Negative for mouth sores and neck stiffness.   Eyes: Negative for visual disturbance.  Respiratory: Negative for cough, chest tightness, shortness of breath and wheezing.   Cardiovascular: Negative for chest pain.  Gastrointestinal: Negative for nausea, vomiting, abdominal pain,  diarrhea and constipation.  Endocrine: Negative for polydipsia, polyphagia and polyuria.  Genitourinary: Negative for dysuria, urgency, frequency and hematuria.  Musculoskeletal: Positive for back pain.  Skin: Negative for rash.  Allergic/Immunologic: Negative for immunocompromised state.  Neurological: Negative for syncope, light-headedness and headaches.  Hematological: Does not bruise/bleed easily.  Psychiatric/Behavioral: Negative for sleep disturbance. The patient is not nervous/anxious.       Allergies  Butrans; Ketorolac tromethamine; and Opana  Home Medications   Current Outpatient Rx  Name  Route  Sig  Dispense  Refill  . aspirin EC 81 MG tablet   Oral   Take 1 tablet (81 mg total) by mouth daily.         . diphenhydrAMINE (BENADRYL) 25 mg capsule   Oral   Take 50 mg by mouth at bedtime as needed. For sleep         . lisinopril (PRINIVIL,ZESTRIL) 40 MG tablet   Oral   Take 1 tablet (40 mg  total) by mouth daily.   90 tablet   3   . lovastatin (MEVACOR) 20 MG tablet   Oral   Take 1 tablet (20 mg total) by mouth at bedtime.   90 tablet   3   . Melatonin 3 MG TABS   Oral   Take 3 mg by mouth at bedtime.         . metoprolol tartrate (LOPRESSOR) 25 MG tablet   Oral   Take 1 tablet (25 mg total) by mouth 2 (two) times daily.   180 tablet   3   . nortriptyline (PAMELOR) 10 MG capsule   Oral   Take 10-20 mg by mouth at bedtime.         Marland Kitchen omeprazole (PRILOSEC) 40 MG capsule   Oral   Take 1 capsule (40 mg total) by mouth daily.   90 capsule   3   . OxyCODONE (OXYCONTIN) 20 mg T12A   Oral   Take 30 mg by mouth every 8 (eight) hours.          Marland Kitchen oxyCODONE-acetaminophen (PERCOCET) 10-325 MG per tablet   Oral   Take 1 tablet by mouth every 8 (eight) hours as needed. For pain         . rizatriptan (MAXALT) 10 MG tablet   Oral   Take 10 mg by mouth as needed. May repeat in 2 hours if needed no more than 2 tablet in 24 hours For migraines         . diazepam (VALIUM) 5 MG tablet   Oral   Take 1 tablet (5 mg total) by mouth every 6 (six) hours as needed for anxiety (spasms).   10 tablet   0    BP 163/83  Pulse 93  Temp(Src) 98.2 F (36.8 C) (Oral)  Resp 20  Wt 183 lb (83.008 kg)  BMI 23.49 kg/m2  SpO2 98%  Physical Exam  Nursing note and vitals reviewed. Constitutional: He appears well-developed and well-nourished. No distress.  HENT:  Head: Normocephalic and atraumatic.  Mouth/Throat: Oropharynx is clear and moist. No oropharyngeal exudate.  Eyes: Conjunctivae are normal.  Neck: Normal range of motion. Neck supple.  Full ROM without pain  Cardiovascular: Normal rate, regular rhythm and intact distal pulses.   Pulmonary/Chest: Effort normal and breath sounds normal. No respiratory distress. He has no wheezes.  Abdominal: Soft. He exhibits no distension. There is no tenderness.  Musculoskeletal:       Thoracic back: He exhibits decreased  range  of motion.       Lumbar back: He exhibits decreased range of motion and tenderness.  Decreased range of motion of the T-spine and L-spine secondary to spinal fusions and pain. No tenderness to palpation of the spinous processes of the T-spine or L-spine Tenderness to palpation of the bilateral paraspinous muscles of the L-spine Well-healed scars along the L-spine Palpable E-stim in the right sacral area  Lymphadenopathy:    He has no cervical adenopathy.  Neurological: He is alert. He has normal reflexes. Gait abnormal.  Speech is clear and goal oriented, follows commands Normal strength in upper extremities bilaterally, strong and equal grip strength. Decreased strength in lower extremities to 3/5, which is baseline for the patient. Normal dorsiflexion and plantar flexion. Sensation normal to light and sharp touch Moves extremities without ataxia, coordination intact Antalgic gait Normal balance  Skin: Skin is warm and dry. No rash noted. He is not diaphoretic. No erythema.    ED Course  Procedures (including critical care time)  DIAGNOSTIC STUDIES: Oxygen Saturation is 98% on room air, normal by my interpretation.    COORDINATION OF CARE: 8:41 PM-Discussed treatment plan which includes pain medication and muscle relaxants with pt at bedside and pt agreed to plan.    Labs Review Labs Reviewed - No data to display  Imaging Review No results found.  MDM   1. Acute exacerbation of chronic low back pain   2. Chronic pain syndrome   3. LOW BACK PAIN, CHRONIC    CARLE FENECH presents with acute exacerbation of his chronic back pain.  Patient with Hx of chronic back pain with multiple surgeries and a stimulator for which he is follow-ed by pain management.  Pt with baseline neurologic exam.  Patient can walk but states is painful.  No loss of bowel or bladder control.  No concern for cauda equina.  No fever, night sweats, weight loss, h/o cancer, IVDU.  RICE protocol and  muscle realaxer indicated and discussed with patient. I will not prescribe pain medication as pt attends a pain management clinic and he will need to f/u there for further pain control.  Pt states pain is much improved since arrival.  I have also discussed reasons to return immediately to the ER.  Patient expresses understanding and agrees with plan.    I personally performed the services described in this documentation, which was scribed in my presence. The recorded information has been reviewed and is accurate.    Dahlia Client [name], PA-C 03/12/13 2116

## 2013-03-13 NOTE — ED Provider Notes (Signed)
Medical screening examination/treatment/procedure(s) were performed by non-physician practitioner and as supervising physician I was immediately available for consultation/collaboration.  Shikha Bibb F Natiya Seelinger, MD 03/13/13 0828 

## 2013-03-19 ENCOUNTER — Ambulatory Visit: Payer: Medicare Other | Admitting: Cardiology

## 2013-05-20 ENCOUNTER — Encounter (INDEPENDENT_AMBULATORY_CARE_PROVIDER_SITE_OTHER): Payer: Self-pay

## 2013-05-20 ENCOUNTER — Ambulatory Visit (INDEPENDENT_AMBULATORY_CARE_PROVIDER_SITE_OTHER): Payer: Medicare Other | Admitting: Cardiology

## 2013-05-20 ENCOUNTER — Encounter: Payer: Self-pay | Admitting: Cardiology

## 2013-05-20 VITALS — BP 140/82 | HR 81 | Ht 74.0 in | Wt 182.0 lb

## 2013-05-20 DIAGNOSIS — I351 Nonrheumatic aortic (valve) insufficiency: Secondary | ICD-10-CM

## 2013-05-20 DIAGNOSIS — I359 Nonrheumatic aortic valve disorder, unspecified: Secondary | ICD-10-CM

## 2013-05-20 DIAGNOSIS — F17200 Nicotine dependence, unspecified, uncomplicated: Secondary | ICD-10-CM

## 2013-05-20 DIAGNOSIS — R002 Palpitations: Secondary | ICD-10-CM

## 2013-05-20 DIAGNOSIS — E785 Hyperlipidemia, unspecified: Secondary | ICD-10-CM

## 2013-05-20 DIAGNOSIS — G4733 Obstructive sleep apnea (adult) (pediatric): Secondary | ICD-10-CM

## 2013-05-20 DIAGNOSIS — I10 Essential (primary) hypertension: Secondary | ICD-10-CM

## 2013-05-20 MED ORDER — BUPROPION HCL ER (SR) 150 MG PO TB12: 150.0000 mg | ORAL_TABLET | Freq: Two times a day (BID) | ORAL | Status: DC

## 2013-05-20 MED ORDER — PRAVASTATIN SODIUM 40 MG PO TABS: 40.0000 mg | ORAL_TABLET | Freq: Every evening | ORAL | Status: DC

## 2013-05-20 NOTE — Patient Instructions (Signed)
Stop lovastatin.   Start pravastatin 40mg  daily in the evening.   Start welbutrin 150mg  every 12 hours to help you stop smoking. Take 1 tablet daily for 3 days, then increase to 1 tablet every 12 hours.   Your physician recommends that you return for a FASTING lipid profile /liver profile in 2 months.   Your physician wants you to follow-up in: 1 year with Dr Shirlee Latch. (November 2015).  You will receive a reminder letter in the mail two months in advance. If you don't receive a letter, please call our office to schedule the follow-up appointment.

## 2013-05-21 DIAGNOSIS — I351 Nonrheumatic aortic (valve) insufficiency: Secondary | ICD-10-CM | POA: Insufficient documentation

## 2013-05-21 NOTE — Progress Notes (Signed)
Patient ID: Willie Andrews, male   DOB: [birth year], 57 y.o.   MRN: [id number] PCP: Dr. Laury Axon  57 yo with history of HTN, smoking, low back pain on disability, and hyperlipidemia presents for cardiology followup.  He is not very active due to back pain.  He follows in a pain clinic.  No exertional dyspnea or chest pain.  He still gets short of breath when lying down in bed at night and coughs/wheezes lying in bed at night.  He does get reflux symptoms as well (belching, acid taste in mouth) when lying in bed at night as well.  At last appointment, I started him on omeprazole.  This helped the reflux symptoms but not the dyspnea with lying down.  He is still smoking 1 ppd and wants to stop.  No chest pain. He is not taking lovastatin because it is too expensive.  Rare palpitations.   Labs (12/13): K 3.8, creatinine 1.0, TSH normal, TGs 391, HDL 36, LDL 109 Labs (7/14): K 4.5, creatinine 0.9, LDL 106, HDL 37  ECG: NSR, inferior T wave inversions  PMH: 1. HTN 2. Hyperlipidemia 3. Active smoker with suspected COPD 4. Depression 5. Migraines 6. BPH 7. OSA 8. Low back pain s/p L-spine surgery.  9. Aortic insufficiency: Possibly due to HTN.  Echo (12/13) with EF 55-60%, moderate LVH, grade I diastolic dysfunction, mild to moderate AI, trileaflet aortic valve.   10. Palpitations: Event monitor 1/14 with no atrial fibrillation.  There was a short run of idioventricular rhythm.   SH: Separated, smokes 1 ppd.  On disability from back pain.  Lives in [place].   FH: Uncle with CABG.  Cousin with atrial fibrillation.   ROS: All systems reviewed and negative except as per HPI.   Current Outpatient Prescriptions  Medication Sig Dispense Refill  . aspirin EC 81 MG tablet Take 1 tablet (81 mg total) by mouth daily.      . diazepam (VALIUM) 5 MG tablet Take 1 tablet (5 mg total) by mouth every 6 (six) hours as needed for anxiety (spasms).  10 tablet  0  . diphenhydrAMINE (BENADRYL) 25 mg capsule Take 50  mg by mouth at bedtime as needed. For sleep      . hydrOXYzine (VISTARIL) 25 MG capsule as needed.      Marland Kitchen lisinopril (PRINIVIL,ZESTRIL) 40 MG tablet Take 1 tablet (40 mg total) by mouth daily.  90 tablet  3  . Melatonin 3 MG TABS Take 3 mg by mouth at bedtime.      . metoprolol tartrate (LOPRESSOR) 25 MG tablet Take 1 tablet (25 mg total) by mouth 2 (two) times daily.  180 tablet  3  . nortriptyline (PAMELOR) 10 MG capsule Take 10-20 mg by mouth at bedtime.      Marland Kitchen omeprazole (PRILOSEC) 40 MG capsule Take 1 capsule (40 mg total) by mouth daily.  90 capsule  3  . OxyCODONE (OXYCONTIN) 20 mg T12A Take 30 mg by mouth every 8 (eight) hours.       Marland Kitchen oxyCODONE-acetaminophen (PERCOCET) 10-325 MG per tablet Take 1 tablet by mouth every 8 (eight) hours as needed. For pain      . rizatriptan (MAXALT) 10 MG tablet Take 10 mg by mouth as needed. May repeat in 2 hours if needed no more than 2 tablet in 24 hours For migraines      . buPROPion (WELLBUTRIN SR) 150 MG 12 hr tablet Take 1 tablet (150 mg total) by mouth 2 (two) times  daily.  60 tablet  2  . pravastatin (PRAVACHOL) 40 MG tablet Take 1 tablet (40 mg total) by mouth every evening.  30 tablet  3  . [DISCONTINUED] vardenafil (LEVITRA) 20 MG tablet Take 20 mg by mouth as directed.         No current facility-administered medications for this visit.    BP 140/82  Pulse 81  Ht 6\' 2"  (1.88 m)  Wt 82.555 kg (182 lb)  BMI 23.36 kg/m2 General: NAD Neck: No JVD, no thyromegaly or thyroid nodule.  Lungs: Prolonged expiratory phase. CV: Nondisplaced PMI.  Heart regular S1/S2, no S3/S4, 1/6 SEM RUSB.  No peripheral edema.  No carotid bruit.  Normal pedal pulses.  Abdomen: Soft, nontender, no hepatosplenomegaly, no distention.  Neurologic: Alert and oriented x 3.  Psych: Normal affect. Extremities: No clubbing or cyanosis.   Assessment/Plan: 1. HTN: Acceptable.  Continue current meds.   2. Aortic insufficiency: Mild to moderate on last echo.  The  aortic valve is trileaflet.  The AI may be related to HTN.  Continue BP control.   Repeat echo in 12/15 (2 years).    3. Palpitations: 2 week event monitor showed no atrial fibrillation but did show a short run of idioventricular rhythm.  Palpitations improved on Toprol XL and are minimal now.   4. Smoking: I strongly encouraged him to quit. He wants to try Wellbutrin.  I gave him a prescription today.  5. Dyspnea when lying down: He is not volume overloaded on exam and does not have dyspnea on exertion, so think this is less likely to be orthopnea.  It was not affected by PPI.  Possible that this is a COPD effect.  6. OSA: He never went to pulmonary appt to get back on CPAP.  7. Hyperlipidemia: He has not taken lovastatin due to expense.  He should be able to get pravastatin very cheaply.  I will start pravastatin at 40 mg daily with lipids/LFTs in 2 months.   Followup in 1 year.   Marca Ancona 05/21/2013

## 2013-05-23 ENCOUNTER — Ambulatory Visit: Payer: Medicare Other | Admitting: Cardiology

## 2013-06-23 ENCOUNTER — Other Ambulatory Visit: Payer: Self-pay

## 2013-07-22 ENCOUNTER — Other Ambulatory Visit (INDEPENDENT_AMBULATORY_CARE_PROVIDER_SITE_OTHER): Payer: Medicare Other

## 2013-07-22 DIAGNOSIS — I10 Essential (primary) hypertension: Secondary | ICD-10-CM

## 2013-07-22 DIAGNOSIS — R002 Palpitations: Secondary | ICD-10-CM

## 2013-07-22 LAB — HEPATIC FUNCTION PANEL
ALBUMIN: 4.4 g/dL (ref 3.5–5.2)
ALK PHOS: 88 U/L (ref 39–117)
ALT: 17 U/L (ref 0–53)
AST: 18 U/L (ref 0–37)
BILIRUBIN TOTAL: 0.5 mg/dL (ref 0.3–1.2)
Bilirubin, Direct: 0.1 mg/dL (ref 0.0–0.3)
Total Protein: 7.4 g/dL (ref 6.0–8.3)

## 2013-07-22 LAB — LDL CHOLESTEROL, DIRECT: Direct LDL: 93.6 mg/dL

## 2013-07-22 LAB — LIPID PANEL
CHOL/HDL RATIO: 5
CHOLESTEROL: 156 mg/dL (ref 0–200)
HDL: 32.5 mg/dL — ABNORMAL LOW (ref >39.00)
TRIGLYCERIDES: 228 mg/dL — AB (ref 0.0–149.0)
VLDL: 45.6 mg/dL — ABNORMAL HIGH (ref 0.0–40.0)

## 2013-07-28 ENCOUNTER — Ambulatory Visit (INDEPENDENT_AMBULATORY_CARE_PROVIDER_SITE_OTHER): Payer: Medicare Other | Admitting: Family Medicine

## 2013-07-28 ENCOUNTER — Encounter: Payer: Self-pay | Admitting: Family Medicine

## 2013-07-28 VITALS — BP 134/76 | HR 70 | Temp 98.6°F | Wt 179.4 lb

## 2013-07-28 DIAGNOSIS — K117 Disturbances of salivary secretion: Secondary | ICD-10-CM

## 2013-07-28 DIAGNOSIS — M25511 Pain in right shoulder: Secondary | ICD-10-CM

## 2013-07-28 DIAGNOSIS — E785 Hyperlipidemia, unspecified: Secondary | ICD-10-CM

## 2013-07-28 DIAGNOSIS — M62838 Other muscle spasm: Secondary | ICD-10-CM

## 2013-07-28 DIAGNOSIS — M25519 Pain in unspecified shoulder: Secondary | ICD-10-CM

## 2013-07-28 DIAGNOSIS — I10 Essential (primary) hypertension: Secondary | ICD-10-CM

## 2013-07-28 MED ORDER — DIAZEPAM 5 MG PO TABS
5.0000 mg | ORAL_TABLET | Freq: Four times a day (QID) | ORAL | Status: DC | PRN
Start: 2013-07-28 — End: 2013-10-30

## 2013-07-28 MED ORDER — DIAZEPAM 5 MG PO TABS: 5.0000 mg | ORAL_TABLET | Freq: Four times a day (QID) | ORAL | Status: DC | PRN

## 2013-07-28 NOTE — Patient Instructions (Signed)
   Shoulder Pain The shoulder is the joint that connects your arms to your body. The bones that form the shoulder joint include the upper arm bone (humerus), the shoulder blade (scapula), and the collarbone (clavicle). The top of the humerus is shaped like a ball and fits into a rather flat socket on the scapula (glenoid cavity). A combination of muscles and strong, fibrous tissues that connect muscles to bones (tendons) support your shoulder joint and hold the ball in the socket. Small, fluid-filled sacs (bursae) are located in different areas of the joint. They act as cushions between the bones and the overlying soft tissues and help reduce friction between the gliding tendons and the bone as you move your arm. Your shoulder joint allows a wide range of motion in your arm. This range of motion allows you to do things like scratch your back or throw a ball. However, this range of motion also makes your shoulder more prone to pain from overuse and injury. Causes of shoulder pain can originate from both injury and overuse and usually can be grouped in the following four categories:  Redness, swelling, and pain (inflammation) of the tendon (tendinitis) or the bursae (bursitis).  Instability, such as a dislocation of the joint.  Inflammation of the joint (arthritis).  Broken bone (fracture). HOME CARE INSTRUCTIONS   Apply ice to the sore area.  Put ice in a plastic bag.  Place a towel between your skin and the bag.  Leave the ice on for 15-20 minutes, 03-04 times per day for the first 2 days.  Stop using cold packs if they do not help with the pain.  If you have a shoulder sling or immobilizer, wear it as long as your caregiver instructs. Only remove it to shower or bathe. Move your arm as little as possible, but keep your hand moving to prevent swelling.  Squeeze a soft ball or foam pad as much as possible to help prevent swelling.  Only take over-the-counter or prescription medicines for  pain, discomfort, or fever as directed by your caregiver. SEEK MEDICAL CARE IF:   Your shoulder pain increases, or new pain develops in your arm, hand, or fingers.  Your hand or fingers become cold and numb.  Your pain is not relieved with medicines. SEEK IMMEDIATE MEDICAL CARE IF:   Your arm, hand, or fingers are numb or tingling.  Your arm, hand, or fingers are significantly swollen or turn white or blue. MAKE SURE YOU:   Understand these instructions.  Will watch your condition.  Will get help right away if you are not doing well or get worse. Document Released: 04/12/2005 Document Revised: 03/27/2012 Document Reviewed: 06/17/2011 ExitCare Patient Information 2014 [employer].  

## 2013-07-28 NOTE — Progress Notes (Signed)
Pre visit review using our clinic review tool, if applicable. No additional management support is needed unless otherwise documented below in the visit note. 

## 2013-07-28 NOTE — Progress Notes (Signed)
  Subjective:    Patient here for follow-up of elevated blood pressure.  He is not exercising and is adherent to a low-salt diet.  Blood pressure is well controlled at home. Cardiac symptoms: none. Patient denies: chest pain, chest pressure/discomfort, claudication, dyspnea, exertional chest pressure/discomfort, fatigue, irregular heart beat, lower extremity edema, near-syncope, orthopnea, palpitations, paroxysmal nocturnal dyspnea, syncope and tachypnea. Cardiovascular risk factors: dyslipidemia, hypertension, male gender, sedentary lifestyle and smoking/ tobacco exposure. Use of agents associated with hypertension: none. History of target organ damage: none. Pt also c/o R shoulder pain about 2 weeks.  He went to get up from the couch and he felt a pop and his arm was stuck.  It has been hurting since. Pt also c/o over production of saliva that is really causing a problems at night.   The following portions of the patient's history were reviewed and updated as appropriate: allergies, current medications, past family history, past medical history, past social history, past surgical history and problem list.  Review of Systems Pertinent items are noted in HPI.     Objective:    BP 134/76  Pulse 70  Temp(Src) 98.6 F (37 C) (Oral)  Wt 179 lb 6.4 oz (81.375 kg)  SpO2 99% General appearance: alert, cooperative, appears stated age and no distress Neck: no adenopathy, no carotid bruit, no JVD, supple, symmetrical, trachea midline and thyroid not enlarged, symmetric, no tenderness/mass/nodules Lungs: clear to auscultation bilaterally Heart: S1, S2 normal Extremities: Pain R shoulder -- unable to lift past 90 degrees  Neurologic: Grossly normal    Assessment:    Hypertension, normal blood pressure . Evidence of target organ damage: none.    Plan:    Medication: no change. Dietary sodium restriction. Regular aerobic exercise. Check blood pressures 2-3 times weekly and record. Follow up: 6  months and as needed.

## 2013-07-28 NOTE — Assessment & Plan Note (Signed)
Stable con't meds 

## 2013-07-28 NOTE — Assessment & Plan Note (Signed)
?   Rotator cuff injury Refer to ortho

## 2013-07-28 NOTE — Assessment & Plan Note (Signed)
X 2 months Refer to ent if no improvement

## 2013-07-29 ENCOUNTER — Telehealth: Payer: Self-pay | Admitting: Family Medicine

## 2013-07-29 NOTE — Telephone Encounter (Signed)
Relevant patient education assigned to patient using Emmi. ° °

## 2013-08-16 ENCOUNTER — Telehealth: Payer: Self-pay | Admitting: Family Medicine

## 2013-08-16 NOTE — Telephone Encounter (Signed)
Relevant patient education assigned to patient using Emmi. ° °

## 2013-09-25 ENCOUNTER — Other Ambulatory Visit: Payer: Self-pay | Admitting: Family Medicine

## 2013-09-26 ENCOUNTER — Telehealth: Payer: Self-pay | Admitting: Family Medicine

## 2013-09-26 NOTE — Telephone Encounter (Signed)
Prior authorization paperwork faxed 09/26/2013. Awaiting response. [id number]

## 2013-09-26 NOTE — Telephone Encounter (Signed)
Willie Andrews called to speak with dr Etter Sjogren about his Cialis medication. Willie Andrews states that his insurance is not covering it and wanted to see is there any paperwork that needs to be filled out for his insurance to cover it. Please advise

## 2013-09-29 NOTE — Telephone Encounter (Signed)
Willie Andrews from [facility] called and stated that medicare denied coverage for Cialis. If you have any questions please call [phone].

## 2013-10-01 NOTE — Telephone Encounter (Signed)
Patient called to find out if we have any samples of Cialis available. CB# [phone]

## 2013-10-01 NOTE — Telephone Encounter (Signed)
Spoke with patient and advised that we do have samples and that I would place one up front for him to pick up at his convenience. I alo asked the patient to contact his insurance company to see if they would cover Viagra since they are denying coverage of cialis. Pt stateed he woul ddo that and call us back with information. Cialis 5mg  Lot #[id number] C Exp 01/2016

## 2013-10-30 ENCOUNTER — Ambulatory Visit (INDEPENDENT_AMBULATORY_CARE_PROVIDER_SITE_OTHER): Payer: Medicare Other | Admitting: Family Medicine

## 2013-10-30 ENCOUNTER — Encounter: Payer: Self-pay | Admitting: Family Medicine

## 2013-10-30 VITALS — BP 156/94 | HR 79 | Temp 98.2°F | Wt 180.0 lb

## 2013-10-30 DIAGNOSIS — M79609 Pain in unspecified limb: Secondary | ICD-10-CM

## 2013-10-30 DIAGNOSIS — R0609 Other forms of dyspnea: Secondary | ICD-10-CM

## 2013-10-30 DIAGNOSIS — R0989 Other specified symptoms and signs involving the circulatory and respiratory systems: Secondary | ICD-10-CM

## 2013-10-30 DIAGNOSIS — I10 Essential (primary) hypertension: Secondary | ICD-10-CM

## 2013-10-30 DIAGNOSIS — M79642 Pain in left hand: Secondary | ICD-10-CM

## 2013-10-30 DIAGNOSIS — M62838 Other muscle spasm: Secondary | ICD-10-CM

## 2013-10-30 MED ORDER — OMEPRAZOLE 40 MG PO CPDR: 40.0000 mg | DELAYED_RELEASE_CAPSULE | Freq: Every day | ORAL | Status: DC

## 2013-10-30 MED ORDER — DIAZEPAM 5 MG PO TABS: 5.0000 mg | ORAL_TABLET | Freq: Four times a day (QID) | ORAL | Status: DC | PRN

## 2013-10-30 MED ORDER — LISINOPRIL 40 MG PO TABS: 40.0000 mg | ORAL_TABLET | Freq: Every day | ORAL | Status: DC

## 2013-10-30 NOTE — Patient Instructions (Signed)
Hand Fracture Your caregiver has diagnosed you with a fractured (broken) bone in your hand. If the bones are in good position and the hand is properly immobilized and rested, these injuries will usually heal in 3 to 6 weeks. A cast, splint, or bulky bandage is usually applied to keep the fracture site from moving. Do not remove the splint or cast until your caregiver approves. If the fracture is unstable or the bones are not aligned properly, surgery may be needed. Keep your hand raised (elevated) above the level of your heart as much as possible for the next 2 to 3 days until the swelling and pain are better. Apply ice packs for 15-20 minutes every 3 to 4 hours to help control the pain and swelling. See your caregiver or an orthopedic specialist as directed for follow-up care to make sure the fracture is beginning to heal properly. SEEK IMMEDIATE MEDICAL CARE IF:   You notice your fingers are cold, numb, crooked, or the pain of your injury is severe.  You are not improving or seem to be getting worse.  You have questions or concerns. Document Released: 08/10/2004 Document Revised: 09/25/2011 Document Reviewed: 12/29/2008 [facility] Patient Information 2014 [place].

## 2013-10-30 NOTE — Progress Notes (Signed)
Pre visit review using our clinic review tool, if applicable. No additional management support is needed unless otherwise documented below in the visit note. 

## 2013-10-30 NOTE — Progress Notes (Signed)
  Subjective:    Willie Andrews is an 58 y.o. male who presents for evaluation of left wrist pain. Onset was sudden, related to a fall from standing. Mechanism of injury: fall. The pain is severe, worsens with movement, and is relieved by rest. There is no associated numbness in L hand/wrist. There is history of injury. Evaluation to date: plain films: normal and pt given sling and splint. Treatment to date: OTC analgesics, ice, avoidance of offending activity, wrist splint which is somewhat effective.  The following portions of the patient's history were reviewed and updated as appropriate: allergies, current medications, past family history, past medical history, past social history, past surgical history and problem list.  Review of Systems Pertinent items are noted in HPI.   Objective:    BP 156/94  Pulse 79  Temp(Src) 98.2 F (36.8 C) (Oral)  Wt 180 lb (81.647 kg)  SpO2 98% Right wrist:  soft tissue tenderness and swelling at the Left hand and wrist  Left wrist:  normal exam, no swelling, tenderness, instability; ligaments intact, full ROM both hands, wrists, and finger joints   Imaging: X-ray left wrist: no fracture, dislocation, swelling or degenerative changes noted ---per pt  Assessment:    Suspect occult fracture of L hand on the left side ---- xrays in ER were neg  Plan:    Educational materials distributed. Resr, ice, compression, and elevation (RICE) therapy. Reduction in offending activity discussed. Orthopedics referral.

## 2013-11-24 ENCOUNTER — Telehealth: Payer: Self-pay | Admitting: Family Medicine

## 2013-11-24 ENCOUNTER — Other Ambulatory Visit: Payer: Self-pay | Admitting: Family Medicine

## 2013-11-24 NOTE — Telephone Encounter (Signed)
Rx was given on 10/30/13 with 2 refills, He will call and if he cn not get the refills he will call us back.     KP

## 2013-11-24 NOTE — Telephone Encounter (Signed)
Caller name: Chin  Relation to pt: Call back Estelle: [facility] [address], [place], [place]  Reason for call: pt would like a refill on RX diazepam (VALIUM) 5 MG

## 2013-11-24 NOTE — Telephone Encounter (Signed)
Rx called in to the pharmacy. They stated they did not receive the approval last month.   KP

## 2013-12-10 ENCOUNTER — Telehealth: Payer: Self-pay | Admitting: Family Medicine

## 2013-12-10 MED ORDER — TADALAFIL 5 MG PO TABS: ORAL_TABLET | ORAL | Status: DC

## 2013-12-10 NOTE — Telephone Encounter (Signed)
Caller name:Harman Sigmon Relation to KA:[id number] Call back number: [phone] Pharmacy:  Reason for call: patient called to request a had copy rx for Cilias to take to [place]. Please advise.

## 2013-12-10 NOTE — Telephone Encounter (Signed)
Rx sent      KP 

## 2013-12-12 MED ORDER — SILDENAFIL CITRATE 100 MG PO TABS: 50.0000 mg | ORAL_TABLET | Freq: Every day | ORAL | Status: DC | PRN

## 2013-12-12 NOTE — Telephone Encounter (Signed)
Rx sent to the pharmacy. Unable to leave the patient a message because his VM has not been setup yet.     KP

## 2013-12-12 NOTE — Telephone Encounter (Signed)
viagra 100 mg #10  1/2-1 po qd prn  

## 2013-12-12 NOTE — Telephone Encounter (Signed)
What dose of the Viagra and what quantity?

## 2013-12-12 NOTE — Telephone Encounter (Signed)
Fine with me-- I thought the dose was different--- -much lower at [facility] but if they will fill the same dose there that is fine with me

## 2013-12-12 NOTE — Telephone Encounter (Signed)
Patient called stating that he called [facility] and they do not carry Cilias anymore. Patient would like to try Cindafil but would like to discuss this with dr Etter Sjogren. Please advise

## 2013-12-12 NOTE — Addendum Note (Signed)
Addended by: Ewing Schlein on: 12/12/2013 03:43 PM   Modules accepted: Orders

## 2013-12-12 NOTE — Telephone Encounter (Signed)
Spoke with patient and he said [facility] carries the Viagra for 2 dollars a pill or Cindafil and not the Cialis. Please advise if it is ok to change the Rx     KP

## 2013-12-17 ENCOUNTER — Telehealth: Payer: Self-pay

## 2013-12-17 MED ORDER — SILDENAFIL CITRATE 20 MG PO TABS: ORAL_TABLET | ORAL | Status: DC

## 2013-12-17 NOTE — Telephone Encounter (Signed)
20 mg viagra #10 1 po qd prn as directed------  rx faxed said there were attachments-- there were no attachments

## 2013-12-17 NOTE — Telephone Encounter (Signed)
Request from the pharmacy for Sildenafil 20 mg on directions. Please advise      KP

## 2013-12-23 ENCOUNTER — Telehealth: Payer: Self-pay | Admitting: *Deleted

## 2013-12-23 ENCOUNTER — Encounter: Payer: Self-pay | Admitting: Family Medicine

## 2013-12-23 ENCOUNTER — Ambulatory Visit (INDEPENDENT_AMBULATORY_CARE_PROVIDER_SITE_OTHER): Payer: Medicare Other | Admitting: Family Medicine

## 2013-12-23 VITALS — BP 120/74 | HR 97 | Temp 98.7°F | Wt 183.0 lb

## 2013-12-23 DIAGNOSIS — K047 Periapical abscess without sinus: Secondary | ICD-10-CM

## 2013-12-23 MED ORDER — AMOXICILLIN-POT CLAVULANATE 875-125 MG PO TABS: 1.0000 | ORAL_TABLET | Freq: Two times a day (BID) | ORAL | Status: DC

## 2013-12-23 NOTE — Progress Notes (Signed)
   Subjective:    Patient ID: Willie Andrews, male    DOB: 1956/04/01, 58 y.o.   MRN: [id number]  HPI Pt here c/o abscessed tooth.  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   Review of Systems As above    Objective:   Physical Exam  BP 120/74  Pulse 97  Temp(Src) 98.7 F (37.1 C) (Oral)  Wt 183 lb (83.008 kg)  SpO2 97% General appearance: alert, cooperative, appears stated age and no distress Throat: abnormal findings: dentition: multiple carries-  + abscess tooth on Left      Assessment & Plan:  1. Tooth abscess F/u with dentist when - amoxicillin-clavulanate (AUGMENTIN) 875-125 MG per tablet; Take 1 tablet by mouth 2 (two) times daily.  Dispense: 20 tablet; Refill: 0

## 2013-12-23 NOTE — Telephone Encounter (Signed)
Caller name:  Rithvik  Relation to pt:  self Call back number:  [phone] Pharmacy:  [facility]  Reason for call:   Pt requested his prescription be sent to [facility] during his appt today.  Somehow it was sent to [facility] in [place].    Can you please send it to [facility]?  Thank you.  bw

## 2013-12-23 NOTE — Progress Notes (Signed)
Pre visit review using our clinic review tool, if applicable. No additional management support is needed unless otherwise documented below in the visit note. 

## 2013-12-23 NOTE — Telephone Encounter (Signed)
Rx has been sent.       KP 

## 2013-12-23 NOTE — Patient Instructions (Signed)
°  Dental Abscess °A dental abscess is a collection of infected fluid (pus) from a bacterial infection in the inner part of the tooth (pulp). It usually occurs at the end of the tooth's root.  °CAUSES  °· Severe tooth decay. °· Trauma to the tooth that allows bacteria to enter into the pulp, such as a broken or chipped tooth. °SYMPTOMS  °· Severe pain in and around the infected tooth. °· Swelling and redness around the abscessed tooth or in the mouth or face. °· Tenderness. °· Pus drainage. °· Bad breath. °· Bitter taste in the mouth. °· Difficulty swallowing. °· Difficulty opening the mouth. °· Nausea. °· Vomiting. °· Chills. °· Swollen neck glands. °DIAGNOSIS  °· A medical and dental history will be taken. °· An examination will be performed by tapping on the abscessed tooth. °· X-rays may be taken of the tooth to identify the abscess. °TREATMENT °The goal of treatment is to eliminate the infection. You may be prescribed antibiotic medicine to stop the infection from spreading. A root canal may be performed to save the tooth. If the tooth cannot be saved, it may be pulled (extracted) and the abscess may be drained.  °HOME CARE INSTRUCTIONS °· Only take over-the-counter or prescription medicines for pain, fever, or discomfort as directed by your caregiver. °· Rinse your mouth (gargle) often with salt water (¼ tsp salt in 8 oz [250 ml] of warm water) to relieve pain or swelling. °· Do not drive after taking pain medicine (narcotics). °· Do not apply heat to the outside of your face. °· Return to your dentist for further treatment as directed. °SEEK MEDICAL CARE IF: °· Your pain is not helped by medicine. °· Your pain is getting worse instead of better. °SEEK IMMEDIATE MEDICAL CARE IF: °· You have a fever or persistent symptoms for more than 2 3 days. °· You have a fever and your symptoms suddenly get worse. °· You have chills or a very bad headache. °· You have problems breathing or swallowing. °· You have trouble  opening your mouth. °· You have swelling in the neck or around the eye. °Document Released: 07/03/2005 Document Revised: 03/27/2012 Document Reviewed: 10/11/2010 °ExitCare® Patient Information ©2014 [employer]. ° ° °

## 2013-12-24 ENCOUNTER — Telehealth: Payer: Self-pay | Admitting: Family Medicine

## 2013-12-24 NOTE — Telephone Encounter (Signed)
Relevant patient education assigned to patient using Emmi. ° °

## 2014-02-05 ENCOUNTER — Ambulatory Visit (INDEPENDENT_AMBULATORY_CARE_PROVIDER_SITE_OTHER): Payer: No Typology Code available for payment source | Admitting: Licensed Clinical Social Worker

## 2014-02-05 DIAGNOSIS — F4323 Adjustment disorder with mixed anxiety and depressed mood: Secondary | ICD-10-CM

## 2014-02-10 ENCOUNTER — Telehealth: Payer: Self-pay

## 2014-02-10 NOTE — Telephone Encounter (Addendum)
Results came in from Pain clinic and the patient's Testosterone was low at 299, Per Dr.Lowne he will need a follow up with Urology and . I spoke with him and he stated he has an appointment scheduled with Endo and will discuss with them. Dr. Etter Sjogren has been made aware.     KP

## 2014-02-13 ENCOUNTER — Encounter: Payer: Self-pay | Admitting: Family Medicine

## 2014-02-13 ENCOUNTER — Ambulatory Visit (INDEPENDENT_AMBULATORY_CARE_PROVIDER_SITE_OTHER): Payer: Medicare Other | Admitting: Family Medicine

## 2014-02-13 VITALS — BP 140/80 | HR 74 | Temp 98.0°F | Resp 16 | Wt 174.2 lb

## 2014-02-13 DIAGNOSIS — R454 Irritability and anger: Secondary | ICD-10-CM

## 2014-02-13 DIAGNOSIS — F911 Conduct disorder, childhood-onset type: Secondary | ICD-10-CM

## 2014-02-13 MED ORDER — SERTRALINE HCL 50 MG PO TABS: 50.0000 mg | ORAL_TABLET | Freq: Every day | ORAL | Status: DC

## 2014-02-13 NOTE — Progress Notes (Signed)
Pre visit review using our clinic review tool, if applicable. No additional management support is needed unless otherwise documented below in the visit note. 

## 2014-02-13 NOTE — Progress Notes (Signed)
   Subjective:    Patient ID: Willie Andrews, male    DOB: 24-Apr-1956, 58 y.o.   MRN: [id number]  HPI Pt here to discuss his anger issues.  The counselor states he needs meds.     Review of Systems As above    Objective:   Physical Exam  BP 140/80  Pulse 74  Temp(Src) 98 F (36.7 C) (Oral)  Resp 16  Wt 174 lb 4 oz (79.039 kg)  SpO2 97% General appearance: alert, cooperative, appears stated age and no distress Neurologic: Alert and oriented X 3, normal strength and tone. Normal symmetric reflexes. Normal coordination and gait Pt states he gets angry with his girlfriend and he explodes.  It scares her.   They are both going to counseling.      Assessment & Plan:  1. Excessive anger Start med-- rto 1 month or sooner prn  - sertraline (ZOLOFT) 50 MG tablet; Take 1 tablet (50 mg total) by mouth daily.  Dispense: 30 tablet; Refill: 3

## 2014-02-13 NOTE — Patient Instructions (Signed)
Anger Management Anger is a normal human emotion. However, anger can range from mild irritation to rage. When your anger becomes harmful to yourself or others, it is unhealthy anger.  CAUSES  There are many reasons for unhealthy anger. Many people learn how to express anger from observing how their family expressed anger. In troubled, chaotic, or abusive families, anger can be expressed as rage or even violence. Children can grow up never learning how healthy anger can be expressed. Factors that contribute to unhealthy anger include:   Drug or alcohol abuse.  Post-traumatic stress disorder.  Traumatic brain injury. COMPLICATIONS  People with unhealthy anger tend to overreact and retaliate against a real or imagined threat. The need to retaliate can turn into violence or verbal abuse against another person. Chronic anger can lead to health problems, such as hypertension, high blood pressure, and depression. TREATMENT  Exercising, relaxing, meditating, or writing out your feelings all can be beneficial in managing moderate anger. For unhealthy anger, the following methods may be used:  Cognitive-behavioral counseling (learning skills to change the thoughts that influence your mood).  Relaxation training.  Interpersonal counseling.  Assertive communication skills.  Medication. Document Released: 04/30/2007 Document Revised: 09/25/2011 Document Reviewed: 09/08/2010 ExitCare Patient Information 2015 [employer]. This information is not intended to replace advice given to you by your health care provider. Make sure you discuss any questions you have with your health care provider.  

## 2014-02-18 ENCOUNTER — Ambulatory Visit (INDEPENDENT_AMBULATORY_CARE_PROVIDER_SITE_OTHER): Payer: No Typology Code available for payment source | Admitting: Licensed Clinical Social Worker

## 2014-02-18 DIAGNOSIS — F3189 Other bipolar disorder: Secondary | ICD-10-CM

## 2014-02-24 ENCOUNTER — Telehealth: Payer: Self-pay

## 2014-02-24 DIAGNOSIS — M62838 Other muscle spasm: Secondary | ICD-10-CM

## 2014-02-24 NOTE — Telephone Encounter (Signed)
Caller name: Xzavien Relation to pt: Call back number: [phone] Pharmacy:  Reason for call: Willie Andrews called to ask if Dr Ivy Lynn wants him to stay on the diazepam (VALIUM) 5 MG tablet since he is now taking sertraline (ZOLOFT) 50 MG tablet if so he will need to get new prescription for the diazepam (VALIUM) 5 MG tablet. Please call him and let him know what to do.

## 2014-02-24 NOTE — Telephone Encounter (Signed)
You having been prescribing it and it says for muscle spasms.   KP

## 2014-02-24 NOTE — Telephone Encounter (Signed)
i think the valium was given to him for muscle relaxation not for anxiety--- from NS wasn't it

## 2014-02-24 NOTE — Telephone Encounter (Signed)
Please advise      KP 

## 2014-02-25 MED ORDER — DIAZEPAM 5 MG PO TABS: 5.0000 mg | ORAL_TABLET | Freq: Four times a day (QID) | ORAL | Status: DC | PRN

## 2014-02-25 NOTE — Telephone Encounter (Signed)
Refill x1 

## 2014-02-25 NOTE — Telephone Encounter (Signed)
Tried calling the patient but his VM was not setup yet, Rx has been faxed.     KP

## 2014-03-03 ENCOUNTER — Ambulatory Visit (INDEPENDENT_AMBULATORY_CARE_PROVIDER_SITE_OTHER): Payer: Medicare Other | Admitting: Endocrinology

## 2014-03-03 ENCOUNTER — Ambulatory Visit: Payer: Medicare Other | Admitting: Endocrinology

## 2014-03-03 ENCOUNTER — Encounter: Payer: Self-pay | Admitting: Endocrinology

## 2014-03-03 VITALS — BP 146/84 | HR 73 | Temp 98.2°F | Ht 74.0 in | Wt 174.0 lb

## 2014-03-03 DIAGNOSIS — N400 Enlarged prostate without lower urinary tract symptoms: Secondary | ICD-10-CM

## 2014-03-03 DIAGNOSIS — R7989 Other specified abnormal findings of blood chemistry: Secondary | ICD-10-CM

## 2014-03-03 DIAGNOSIS — E291 Testicular hypofunction: Secondary | ICD-10-CM

## 2014-03-03 LAB — BASIC METABOLIC PANEL
BUN: 24 mg/dL — ABNORMAL HIGH (ref 6–23)
CALCIUM: 9.3 mg/dL (ref 8.4–10.5)
CO2: 26 mEq/L (ref 19–32)
Chloride: 106 mEq/L (ref 96–112)
Creatinine, Ser: 0.9 mg/dL (ref 0.4–1.5)
GFR: 92.44 mL/min (ref >60.00)
Glucose, Bld: 79 mg/dL (ref 70–99)
POTASSIUM: 4.7 meq/L (ref 3.5–5.1)
SODIUM: 140 meq/L (ref 135–145)

## 2014-03-03 NOTE — Progress Notes (Signed)
Subjective:    Patient ID: Willie Andrews, male    DOB: September 14, 1955, 58 y.o.   MRN: [id number]  HPI Pt reports he had puberty at the normal age.  He has 2 biological children.  He says he has never taken illicit androgens.  He was noted to have low testosterone in 2013.  He took androgel x a few weeks, in 2013, but not since then.  He denies any h/o infertility.  He reports moderately decreased strength in all 4 extremities, and assoc fatigue.  Past Medical History  Diagnosis Date  . Arthritis   . Depression   . High cholesterol   . Kidney stones   . Migraine   . Benign prostatic hypertrophy     Dr. Rosana Hoes  . Hypertension   . Seasonal allergies   . Sleep apnea   . Chronic pain     Past Surgical History  Procedure Laterality Date  . L5-s1    . Elbow surgery      Left 2008  . Neck surgery      Disc 2002 and 2008  . Shoulder surgery      Right  . Back surgery      1989, 1994, 2002, 2008  . Nasal sinus surgery      2004  . Back surgery  2012    Neuro Implant --elect. stimulator  . Spine surgery  85 94    History   Social History  . Marital Status: Divorced    Spouse Name: N/A    Number of Children: N/A  . Years of Education: N/A   Occupational History  . disabled    Social History Main Topics  . Smoking status: Current Every Day Smoker -- 1.50 packs/day  . Smokeless tobacco: Not on file  . Alcohol Use: Yes     Comment: 1 drink per night  . Drug Use: No  . Sexual Activity: Not on file   Other Topics Concern  . Not on file   Social History Narrative  . No narrative on file    Current Outpatient Prescriptions on File Prior to Visit  Medication Sig Dispense Refill  . amoxicillin-clavulanate (AUGMENTIN) 875-125 MG per tablet Take 1 tablet by mouth 2 (two) times daily.  20 tablet  0  . aspirin EC 81 MG tablet Take 1 tablet (81 mg total) by mouth daily.      . diazepam (VALIUM) 5 MG tablet Take 1 tablet (5 mg total) by mouth every 6 (six) hours as needed for  anxiety (spasms).  60 tablet  0  . diphenhydrAMINE (BENADRYL) 25 mg capsule Take 50 mg by mouth at bedtime as needed. For sleep      . lisinopril (PRINIVIL,ZESTRIL) 40 MG tablet Take 1 tablet (40 mg total) by mouth daily.  90 tablet  3  . Melatonin 3 MG TABS Take 3 mg by mouth at bedtime.      . metoprolol tartrate (LOPRESSOR) 25 MG tablet Take 1 tablet (25 mg total) by mouth 2 (two) times daily.  180 tablet  3  . omeprazole (PRILOSEC) 40 MG capsule Take 1 capsule (40 mg total) by mouth daily.  90 capsule  3  . OxyCODONE (OXYCONTIN) 40 mg T12A 12 hr tablet Take 40 mg by mouth every 12 (twelve) hours.      Marland Kitchen oxyCODONE-acetaminophen (PERCOCET) 10-325 MG per tablet Take 1 tablet by mouth every 8 (eight) hours as needed. For pain      . pravastatin (PRAVACHOL)  40 MG tablet Take 1 tablet (40 mg total) by mouth every evening.  30 tablet  3  . rizatriptan (MAXALT) 10 MG tablet Take 10 mg by mouth as needed. May repeat in 2 hours if needed no more than 2 tablet in 24 hours For migraines      . sertraline (ZOLOFT) 50 MG tablet Take 1 tablet (50 mg total) by mouth daily.  30 tablet  3  . sildenafil (REVATIO) 20 MG tablet 1 tab by mouth as directed  10 tablet  2  . sildenafil (VIAGRA) 100 MG tablet Take 0.5-1 tablets (50-100 mg total) by mouth daily as needed for erectile dysfunction.  10 tablet  2  . tadalafil (CIALIS) 5 MG tablet TAKE ONE TABLET BY MOUTH EVERY DAY AS NEEDED ERECTILE  DYSFUNCTION  30 tablet  0  . [DISCONTINUED] vardenafil (LEVITRA) 20 MG tablet Take 20 mg by mouth as directed.         No current facility-administered medications on file prior to visit.    Allergies  Allergen Reactions  . Butrans [Buprenorphine Hcl] Hives and Itching  . Ketorolac Tromethamine Other (See Comments)    toradol   . Opana [Oxymorphone] Other (See Comments)    Leg cramps    Family History  Problem Relation Age of Onset  . Arthritis    . Alcohol abuse    . Cancer Mother     unknown type    BP  146/84  Pulse 73  Temp(Src) 98.2 F (36.8 C) (Oral)  Ht 6\' 2"  (1.88 m)  Wt 174 lb (78.926 kg)  BMI 22.33 kg/m2  SpO2 94%  Review of Systems He has ED sxs, headache, and decreased sense of smell.  denies polyuria, syncope, rash, depression, cold intolerance, visual loss, gynecomastia, easy bruising, change in facial appearance, rhinorrhea, and n/v.  He has lost 15 lbs x 6 weeks (not due to his efforts, and despite good appetite).      Objective:   Physical Exam VS: see vs page GEN: no distress HEAD: head: no deformity eyes: no periorbital swelling, no proptosis external nose and ears are normal mouth: no lesion seen NECK: 2 healed scars are present (C-spine procedures).  i do not appreciate a nodule in the thyroid or elsewhere in the neck.   CHEST WALL: no deformity. LUNGS: clear to auscultation. BREASTS:  No gynecomastia. CV: reg rate and rhythm, no murmur ABD: abdomen is soft, nontender.  no hepatosplenomegaly.  not distended.  no hernia GENITALIA:  Normal male.   MUSCULOSKELETAL: muscle bulk and strength are grossly normal.  no obvious joint swelling.  gait is normal and steady.  Old healed midline lunbar surgical scar EXTEMITIES: no deformity.  no edema PULSES: no carotid bruit NEURO:  cn 2-12 grossly intact.   readily moves all 4's.  sensation is intact to touch on all 4's SKIN:  Normal texture and temperature.  No rash or suspicious lesion is visible.  Normal hair distribution. NODES:  None palpable at the neck PSYCH: alert, well-oriented.  Does not appear anxious nor depressed.    outside test results are reviewed: Testosterone=299 (may, 2015)  i have reviewed report of testicular US (2009)  i have reviewed the following outside records: Office notes.     Assessment & Plan:  Hypogonadism, mild, usually central and idiopathic. Weight loss, new, uncertain etiology. Decreased sense of smell.  In view of his proven fertility, this is unlikely to be clinically  significant. Excessive anger: in this context, any rx  for his testosterone should be slight and gradual.      Patient is advised the following: Patient Instructions  blood tests are being requested for you today.  We'll contact you with results. Based on the results, I may be able to prescribe for you a pill called "clomiphene." normalization of testosterone is not known to harm you.  however, there are "theoretical" risks, including increased fertility, hair loss, prostate cancer, benign prostate enlargement, blood clots, liver problems, lower hdl ("good cholesterol"), sleep apnea, and behavior changes. Please come back for a follow-up appointment in 1 month.   Please also see Dr Etter Sjogren about your weight loss, especially since you are a smoker.

## 2014-03-03 NOTE — Patient Instructions (Signed)
blood tests are being requested for you today.  We'll contact you with results. Based on the results, I may be able to prescribe for you a pill called "clomiphene." normalization of testosterone is not known to harm you.  however, there are "theoretical" risks, including increased fertility, hair loss, prostate cancer, benign prostate enlargement, blood clots, liver problems, lower hdl ("good cholesterol"), sleep apnea, and behavior changes. Please come back for a follow-up appointment in 1 month.   Please also see Dr Etter Sjogren about your weight loss, especially since you are a smoker.

## 2014-03-04 ENCOUNTER — Ambulatory Visit (INDEPENDENT_AMBULATORY_CARE_PROVIDER_SITE_OTHER): Payer: No Typology Code available for payment source | Admitting: Licensed Clinical Social Worker

## 2014-03-04 DIAGNOSIS — F4323 Adjustment disorder with mixed anxiety and depressed mood: Secondary | ICD-10-CM

## 2014-03-04 LAB — PSA: PSA: 1.24 ng/mL (ref 0.10–4.00)

## 2014-03-04 LAB — FOLLICLE STIMULATING HORMONE: FSH: 4.4 m[IU]/mL (ref 1.4–18.1)

## 2014-03-04 LAB — LUTEINIZING HORMONE: LH: 1.81 m[IU]/mL (ref 1.50–9.30)

## 2014-03-04 LAB — PROLACTIN: PROLACTIN: 3.2 ng/mL (ref 2.1–17.1)

## 2014-03-04 LAB — TSH: TSH: 1.02 u[IU]/mL (ref 0.35–4.50)

## 2014-03-04 MED ORDER — CLOMIPHENE CITRATE 50 MG PO TABS: ORAL_TABLET | ORAL | Status: DC

## 2014-03-10 ENCOUNTER — Ambulatory Visit (INDEPENDENT_AMBULATORY_CARE_PROVIDER_SITE_OTHER): Payer: Medicare Other | Admitting: Family Medicine

## 2014-03-10 ENCOUNTER — Encounter: Payer: Self-pay | Admitting: Family Medicine

## 2014-03-10 VITALS — BP 150/98 | HR 71 | Temp 98.1°F | Wt 177.0 lb

## 2014-03-10 DIAGNOSIS — F911 Conduct disorder, childhood-onset type: Secondary | ICD-10-CM

## 2014-03-10 DIAGNOSIS — I10 Essential (primary) hypertension: Secondary | ICD-10-CM

## 2014-03-10 DIAGNOSIS — N529 Male erectile dysfunction, unspecified: Secondary | ICD-10-CM

## 2014-03-10 DIAGNOSIS — R002 Palpitations: Secondary | ICD-10-CM

## 2014-03-10 DIAGNOSIS — R454 Irritability and anger: Secondary | ICD-10-CM | POA: Insufficient documentation

## 2014-03-10 DIAGNOSIS — E785 Hyperlipidemia, unspecified: Secondary | ICD-10-CM

## 2014-03-10 MED ORDER — METOPROLOL TARTRATE 25 MG PO TABS: 25.0000 mg | ORAL_TABLET | Freq: Two times a day (BID) | ORAL | Status: DC

## 2014-03-10 MED ORDER — LOVASTATIN 40 MG PO TABS: 40.0000 mg | ORAL_TABLET | Freq: Every day | ORAL | Status: DC

## 2014-03-10 MED ORDER — SILDENAFIL CITRATE 20 MG PO TABS: ORAL_TABLET | ORAL | Status: DC

## 2014-03-10 MED ORDER — BENAZEPRIL HCL 40 MG PO TABS: 40.0000 mg | ORAL_TABLET | Freq: Every day | ORAL | Status: DC

## 2014-03-10 NOTE — Assessment & Plan Note (Signed)
Pt has an appointment with psych Monday Counselor wants pt on a mood stabilizer-- i advised pt to bring his formulary with him

## 2014-03-10 NOTE — Progress Notes (Signed)
  Subjective:    Patient here for follow-up of elevated blood pressure.  He is not exercising and is adherent to a low-salt diet.  Blood pressure is not well controlled at home. Cardiac symptoms: none. Patient denies: chest pain, chest pressure/discomfort, claudication, dyspnea, exertional chest pressure/discomfort, fatigue, irregular heart beat, lower extremity edema, near-syncope, orthopnea, palpitations, paroxysmal nocturnal dyspnea, syncope and tachypnea. Cardiovascular risk factors: advanced age (older than 50 for men, 3 for women), dyslipidemia, hypertension, male gender and sedentary lifestyle. Use of agents associated with hypertension: none. History of target organ damage: none.  The following portions of the patient's history were reviewed and updated as appropriate: allergies, current medications, past family history, past medical history, past social history, past surgical history and problem list.  Review of Systems Pertinent items are noted in HPI.     Objective:    BP 150/98  Pulse 71  Temp(Src) 98.1 F (36.7 C) (Oral)  Wt 177 lb (80.287 kg)  SpO2 96% General appearance: alert, cooperative, appears stated age and no distress Neck: no adenopathy, supple, symmetrical, trachea midline and thyroid not enlarged, symmetric, no tenderness/mass/nodules Lungs: clear to auscultation bilaterally Heart: S1, S2 normal    Assessment:    Hypertension, uncontrolled. Evidence of target organ damage: none.    Plan:    Medication: benazapril . Dietary sodium restriction. Regular aerobic exercise. Check blood pressures 2-3 times weekly and record. Follow up: 3 months and as needed.  Discussed with pt importance of taking meds and not running out  1. Unspecified essential hypertension Pt med changed to med on $4 list - benazepril (LOTENSIN) 40 MG tablet; Take 1 tablet (40 mg total) by mouth daily.  Dispense: 90 tablet; Refill: 1  2. Palpitations    3. Erectile dysfunction of  organic origin   - sildenafil (REVATIO) 20 MG tablet; 1 tab by mouth as directed  Dispense: 10 tablet; Refill: 2  4. Other and unspecified hyperlipidemia Changed med to $4 list - lovastatin (MEVACOR) 40 MG tablet; Take 1 tablet (40 mg total) by mouth at bedtime.  Dispense: 90 tablet; Refill: 3  5. Essential hypertension con't med  - metoprolol tartrate (LOPRESSOR) 25 MG tablet; Take 1 tablet (25 mg total) by mouth 2 (two) times daily.  Dispense: 180 tablet; Refill: 3  6. Excessive anger Pt seeing psych Monday-- counselor wants pt on mood stabilizer

## 2014-03-10 NOTE — Patient Instructions (Signed)

## 2014-03-10 NOTE — Progress Notes (Signed)
Pre visit review using our clinic review tool, if applicable. No additional management support is needed unless otherwise documented below in the visit note. 

## 2014-03-13 ENCOUNTER — Ambulatory Visit: Payer: Self-pay | Admitting: Family Medicine

## 2014-03-16 ENCOUNTER — Ambulatory Visit (INDEPENDENT_AMBULATORY_CARE_PROVIDER_SITE_OTHER): Payer: No Typology Code available for payment source | Admitting: Psychiatry

## 2014-03-16 ENCOUNTER — Encounter (HOSPITAL_COMMUNITY): Payer: Self-pay | Admitting: Psychiatry

## 2014-03-16 ENCOUNTER — Encounter (INDEPENDENT_AMBULATORY_CARE_PROVIDER_SITE_OTHER): Payer: Self-pay

## 2014-03-16 VITALS — BP 115/72 | HR 72 | Ht 74.0 in | Wt 179.2 lb

## 2014-03-16 DIAGNOSIS — F39 Unspecified mood [affective] disorder: Secondary | ICD-10-CM

## 2014-03-16 MED ORDER — HYDROXYZINE PAMOATE 25 MG PO CAPS: 25.0000 mg | ORAL_CAPSULE | Freq: Three times a day (TID) | ORAL | Status: DC | PRN

## 2014-03-16 MED ORDER — LAMOTRIGINE 25 MG PO TABS: ORAL_TABLET | ORAL | Status: DC
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
## 2014-03-17 ENCOUNTER — Telehealth: Payer: Self-pay | Admitting: Endocrinology

## 2014-03-17 NOTE — Telephone Encounter (Signed)
Pt coming for appointment on 03/20/2014.

## 2014-03-17 NOTE — Telephone Encounter (Signed)
We can move up appt if you want to.

## 2014-03-17 NOTE — Telephone Encounter (Signed)
See below and please advise. Pt would not discuss with me how the medication is not working.  Thanks!

## 2014-03-17 NOTE — Telephone Encounter (Signed)
Patient stated that Testesterone medication is not helping him he does not feel a difference. Please advise

## 2014-03-20 ENCOUNTER — Encounter: Payer: Self-pay | Admitting: Endocrinology

## 2014-03-20 ENCOUNTER — Ambulatory Visit (INDEPENDENT_AMBULATORY_CARE_PROVIDER_SITE_OTHER): Payer: Medicare Other | Admitting: Endocrinology

## 2014-03-20 VITALS — BP 114/62 | HR 74 | Temp 98.2°F | Ht 74.0 in | Wt 179.0 lb

## 2014-03-20 DIAGNOSIS — R7989 Other specified abnormal findings of blood chemistry: Secondary | ICD-10-CM

## 2014-03-20 DIAGNOSIS — E291 Testicular hypofunction: Secondary | ICD-10-CM

## 2014-03-20 LAB — TESTOSTERONE: Testosterone: 430.7 ng/dL (ref 300.00–890.00)

## 2014-03-20 NOTE — Patient Instructions (Addendum)
A blood test is requested for you today.  We'll contact you with results. normalization of testosterone is not known to harm you.  however, there are "theoretical" risks, including increased fertility, hair loss, prostate cancer, benign prostate enlargement, blood clots, liver problems, lower hdl ("good cholesterol"), sleep apnea, and behavior changes. Please come back for a follow-up appointment in 6 months.

## 2014-03-20 NOTE — Progress Notes (Signed)
Subjective:    Patient ID: Willie Andrews, male    DOB: 17-Dec-1955, 58 y.o.   MRN: [id number]  HPI Pt returns for f/u of idiopathic central hypogonadism (dx'ed 2013; he had puberty at the normal age; he has 2 biological children; he says he has never taken illicit androgens.; he took androgel x a few weeks, in 2013, but not since then; he was started on clomid in mid-2015).  Since on the clomid, he still has fatigue. Past Medical History  Diagnosis Date  . Arthritis   . Depression   . High cholesterol   . Kidney stones   . Migraine   . Benign prostatic hypertrophy     Dr. Rosana Hoes  . Hypertension   . Seasonal allergies   . Sleep apnea   . Chronic pain     Past Surgical History  Procedure Laterality Date  . L5-s1    . Elbow surgery      Left 2008  . Neck surgery      Disc 2002 and 2008  . Shoulder surgery      Right  . Back surgery      1989, 1994, 2002, 2008  . Nasal sinus surgery      2004  . Back surgery  2012    Neuro Implant --elect. stimulator  . Spine surgery  85 94    History   Social History  . Marital Status: Divorced    Spouse Name: N/A    Number of Children: N/A  . Years of Education: N/A   Occupational History  . disabled    Social History Main Topics  . Smoking status: Current Every Day Smoker -- 1.50 packs/day  . Smokeless tobacco: Not on file  . Alcohol Use: Yes     Comment: 1 drink per night  . Drug Use: No  . Sexual Activity: Not on file   Other Topics Concern  . Not on file   Social History Narrative  . No narrative on file    Current Outpatient Prescriptions on File Prior to Visit  Medication Sig Dispense Refill  . aspirin EC 81 MG tablet Take 1 tablet (81 mg total) by mouth daily.      . benazepril (LOTENSIN) 40 MG tablet Take 1 tablet (40 mg total) by mouth daily.  90 tablet  1  . clomiPHENE (CLOMID) 50 MG tablet 1/4 tab daily  10 tablet  1  . diazepam (VALIUM) 5 MG tablet Take 1 tablet (5 mg total) by mouth every 6 (six) hours  as needed for anxiety (spasms).  60 tablet  0  . hydrOXYzine (VISTARIL) 25 MG capsule Take 1 capsule (25 mg total) by mouth 3 (three) times daily as needed.  30 capsule  0  . lamoTRIgine (LAMICTAL) 25 MG tablet Take 1 tab daily for 1 week and than 2 tab daily  60 tablet  0  . Melatonin 3 MG TABS Take 3 mg by mouth at bedtime.      Marland Kitchen omeprazole (PRILOSEC) 40 MG capsule Take 1 capsule (40 mg total) by mouth daily.  90 capsule  3  . OxyCODONE (OXYCONTIN) 40 mg T12A 12 hr tablet Take 40 mg by mouth every 12 (twelve) hours.      Marland Kitchen oxyCODONE-acetaminophen (PERCOCET) 10-325 MG per tablet Take 1 tablet by mouth every 8 (eight) hours as needed. For pain      . pravastatin (PRAVACHOL) 40 MG tablet Take 1 tablet (40 mg total) by mouth every evening.  30 tablet  3  . rizatriptan (MAXALT) 10 MG tablet Take 10 mg by mouth as needed. May repeat in 2 hours if needed no more than 2 tablet in 24 hours For migraines      . sertraline (ZOLOFT) 50 MG tablet Take 50 mg by mouth daily.      . sildenafil (REVATIO) 20 MG tablet 1 tab by mouth as directed  10 tablet  2  . [DISCONTINUED] vardenafil (LEVITRA) 20 MG tablet Take 20 mg by mouth as directed.         No current facility-administered medications on file prior to visit.    Allergies  Allergen Reactions  . Butrans [Buprenorphine Hcl] Hives and Itching  . Ketorolac Tromethamine Other (See Comments)    toradol   . Opana [Oxymorphone] Other (See Comments)    Leg cramps    Family History  Problem Relation Age of Onset  . Arthritis    . Alcohol abuse    . Cancer Mother     unknown type    BP 114/62  Pulse 74  Temp(Src) 98.2 F (36.8 C) (Oral)  Ht 6\' 2"  (1.88 m)  Wt 179 lb (81.194 kg)  BMI 22.97 kg/m2  SpO2 96%   Review of Systems Denies decreased urinary stream.  No change in chronic ED sxs.      Objective:   Physical Exam VITAL SIGNS:  See vs page GENERAL: no distress Ext: no edema  Lab Results  Component Value Date   TESTOSTERONE  430.70 03/20/2014       Assessment & Plan:  Hypogonadism, well-controlled Fatigue, not testosterone-related. ED: persistent: not testosterone-related.  I gave pt info about viagra from [place].  Patient is advised the following: Patient Instructions  A blood test is requested for you today.  We'll contact you with results. normalization of testosterone is not known to harm you.  however, there are "theoretical" risks, including increased fertility, hair loss, prostate cancer, benign prostate enlargement, blood clots, liver problems, lower hdl ("good cholesterol"), sleep apnea, and behavior changes. Please come back for a follow-up appointment in 6 months.   (same clomid is advised)

## 2014-03-25 ENCOUNTER — Ambulatory Visit (INDEPENDENT_AMBULATORY_CARE_PROVIDER_SITE_OTHER): Payer: No Typology Code available for payment source | Admitting: Licensed Clinical Social Worker

## 2014-03-25 DIAGNOSIS — F4323 Adjustment disorder with mixed anxiety and depressed mood: Secondary | ICD-10-CM

## 2014-04-01 ENCOUNTER — Other Ambulatory Visit: Payer: Self-pay | Admitting: Family Medicine

## 2014-04-02 NOTE — Telephone Encounter (Signed)
Last seen 03/10/14 and filled 02/25/14 #60.  Please advise     KP

## 2014-04-03 ENCOUNTER — Ambulatory Visit: Payer: Self-pay | Admitting: Endocrinology

## 2014-04-07 ENCOUNTER — Ambulatory Visit: Payer: Self-pay | Admitting: Endocrinology

## 2014-04-14 ENCOUNTER — Ambulatory Visit (HOSPITAL_COMMUNITY): Payer: Self-pay | Admitting: Psychiatry

## 2014-04-18 ENCOUNTER — Other Ambulatory Visit (HOSPITAL_COMMUNITY): Payer: Self-pay | Admitting: Psychiatry

## 2014-04-20 ENCOUNTER — Telehealth (HOSPITAL_COMMUNITY): Payer: Self-pay

## 2014-04-20 ENCOUNTER — Other Ambulatory Visit (HOSPITAL_COMMUNITY): Payer: Self-pay | Admitting: Psychiatry

## 2014-04-21 ENCOUNTER — Ambulatory Visit (INDEPENDENT_AMBULATORY_CARE_PROVIDER_SITE_OTHER): Payer: No Typology Code available for payment source | Admitting: Licensed Clinical Social Worker

## 2014-04-21 DIAGNOSIS — F4323 Adjustment disorder with mixed anxiety and depressed mood: Secondary | ICD-10-CM

## 2014-05-06 ENCOUNTER — Ambulatory Visit (INDEPENDENT_AMBULATORY_CARE_PROVIDER_SITE_OTHER): Payer: No Typology Code available for payment source | Admitting: Psychiatry

## 2014-05-06 ENCOUNTER — Encounter (HOSPITAL_COMMUNITY): Payer: Self-pay | Admitting: Psychiatry

## 2014-05-06 VITALS — BP 120/59 | HR 66 | Ht 74.0 in | Wt 186.0 lb

## 2014-05-06 DIAGNOSIS — F39 Unspecified mood [affective] disorder: Secondary | ICD-10-CM

## 2014-05-06 MED ORDER — HYDROXYZINE PAMOATE 25 MG PO CAPS: 25.0000 mg | ORAL_CAPSULE | Freq: Every day | ORAL | Status: DC

## 2014-05-06 MED ORDER — LAMOTRIGINE 100 MG PO TABS: 100.0000 mg | ORAL_TABLET | Freq: Every day | ORAL | Status: DC
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
## 2014-05-11 ENCOUNTER — Telehealth: Payer: Self-pay | Admitting: Family Medicine

## 2014-05-11 NOTE — Telephone Encounter (Signed)
Last seen 03/10/14 and filled 04/02/14 #60.  Please advise    KP

## 2014-05-11 NOTE — Telephone Encounter (Signed)
Caller name: Bridger Relation to pt: Call back number: [phone] Pharmacy: [facility]  Reason for call:  Pt is requesting a refill on Rx diazepam (VALIUM) 5 MG tablet

## 2014-05-12 MED ORDER — DIAZEPAM 5 MG PO TABS: ORAL_TABLET | ORAL | Status: DC

## 2014-05-12 NOTE — Telephone Encounter (Signed)
Refill x1 

## 2014-05-12 NOTE — Telephone Encounter (Signed)
Rx faxed.    KP 

## 2014-05-19 ENCOUNTER — Ambulatory Visit (INDEPENDENT_AMBULATORY_CARE_PROVIDER_SITE_OTHER): Payer: No Typology Code available for payment source | Admitting: Licensed Clinical Social Worker

## 2014-05-19 DIAGNOSIS — F4323 Adjustment disorder with mixed anxiety and depressed mood: Secondary | ICD-10-CM

## 2014-05-25 ENCOUNTER — Encounter: Payer: Self-pay | Admitting: Cardiology

## 2014-05-25 ENCOUNTER — Encounter: Payer: Self-pay | Admitting: *Deleted

## 2014-05-25 ENCOUNTER — Ambulatory Visit (INDEPENDENT_AMBULATORY_CARE_PROVIDER_SITE_OTHER): Payer: Medicare Other | Admitting: Cardiology

## 2014-05-25 VITALS — BP 118/70 | HR 83 | Ht 74.0 in | Wt 187.8 lb

## 2014-05-25 DIAGNOSIS — I10 Essential (primary) hypertension: Secondary | ICD-10-CM

## 2014-05-25 DIAGNOSIS — Z720 Tobacco use: Secondary | ICD-10-CM

## 2014-05-25 DIAGNOSIS — R002 Palpitations: Secondary | ICD-10-CM

## 2014-05-25 DIAGNOSIS — F17200 Nicotine dependence, unspecified, uncomplicated: Secondary | ICD-10-CM

## 2014-05-25 DIAGNOSIS — I351 Nonrheumatic aortic (valve) insufficiency: Secondary | ICD-10-CM

## 2014-05-25 DIAGNOSIS — E785 Hyperlipidemia, unspecified: Secondary | ICD-10-CM

## 2014-05-25 DIAGNOSIS — I359 Nonrheumatic aortic valve disorder, unspecified: Secondary | ICD-10-CM

## 2014-05-25 MED ORDER — PRAVASTATIN SODIUM 40 MG PO TABS: 40.0000 mg | ORAL_TABLET | Freq: Every evening | ORAL | Status: DC

## 2014-05-25 MED ORDER — BENAZEPRIL HCL 40 MG PO TABS
40.0000 mg | ORAL_TABLET | Freq: Every day | ORAL | Status: DC
Start: 2014-05-25 — End: 2014-11-10

## 2014-05-25 MED ORDER — METOPROLOL TARTRATE 25 MG PO TABS: 25.0000 mg | ORAL_TABLET | Freq: Two times a day (BID) | ORAL | Status: DC

## 2014-05-25 MED ORDER — BUPROPION HCL ER (SR) 150 MG PO TB12: 150.0000 mg | ORAL_TABLET | Freq: Two times a day (BID) | ORAL | Status: DC

## 2014-05-25 NOTE — Patient Instructions (Signed)
Your physician has requested that you have an echocardiogram. Echocardiography is a painless test that uses sound waves to create images of your heart. It provides your doctor with information about the size and shape of your heart and how well your heart's chambers and valves are working. This procedure takes approximately one hour. There are no restrictions for this procedure.  Your physician wants you to follow-up in: 1 year with Dr Aundra Dubin. (November 2016).  You will receive a reminder letter in the mail two months in advance. If you don't receive a letter, please call our office to schedule the follow-up appointment.

## 2014-05-26 ENCOUNTER — Other Ambulatory Visit: Payer: Self-pay | Admitting: Endocrinology

## 2014-05-26 NOTE — Progress Notes (Signed)
Patient ID: Willie Andrews, male   DOB: 13-Sep-1955, 58 y.o.   MRN: [id number] PCP: Dr. Etter Sjogren  58 yo with history of HTN, smoking, low back pain on disability, and hyperlipidemia presents for cardiology followup.  He is not very active due to back pain.  He follows in a pain clinic.  No exertional dyspnea or chest pain.  He is still smoking 1 ppd and wants to stop.  Rare palpitations, mostly resolved with use of metoprolol.  BP is under good control.  Labs (12/13): K 3.8, creatinine 1.0, TSH normal, TGs 391, HDL 36, LDL 109 Labs (7/14): K 4.5, creatinine 0.9, LDL 106, HDL 37 Labs (1/15): LDL 94, HDL 32, TGs 278  ECG: NSR, RBBB  PMH: 1. HTN 2. Hyperlipidemia 3. Active smoker with suspected COPD 4. Depression 5. Migraines 6. BPH 7. OSA 8. Low back pain s/p L-spine surgery.  9. Aortic insufficiency: Possibly due to HTN.  Echo (12/13) with EF 55-60%, moderate LVH, grade I diastolic dysfunction, mild to moderate AI, trileaflet aortic valve.   10. Palpitations: Event monitor 1/14 with no atrial fibrillation.  There was a short run of idioventricular rhythm.   SH: Separated, smokes 1 ppd.  On disability from back pain.  Lives in [place].   FH: Uncle with CABG.  Cousin with atrial fibrillation.   ROS: All systems reviewed and negative except as per HPI.   Current Outpatient Prescriptions  Medication Sig Dispense Refill  . aspirin EC 81 MG tablet Take 1 tablet (81 mg total) by mouth daily.    . benazepril (LOTENSIN) 40 MG tablet Take 1 tablet (40 mg total) by mouth daily. 90 tablet 3  . clomiPHENE (CLOMID) 50 MG tablet 1/4 tab daily 10 tablet 1  . diazepam (VALIUM) 5 MG tablet TAKE ONE TABLET BY MOUTH EVERY 6 HOURS AS NEEDED FOR ANXIETY (SPASMS). 60 tablet 0  . lamoTRIgine (LAMICTAL) 100 MG tablet Take 1 tablet (100 mg total) by mouth daily. 30 tablet 1  . omeprazole (PRILOSEC) 40 MG capsule Take 1 capsule (40 mg total) by mouth daily. 90 capsule 3  . OxyCODONE (OXYCONTIN) 40 mg T12A 12  hr tablet Take 40 mg by mouth every 12 (twelve) hours.    Marland Kitchen oxyCODONE-acetaminophen (PERCOCET) 10-325 MG per tablet Take 1 tablet by mouth every 8 (eight) hours as needed. For pain    . pravastatin (PRAVACHOL) 40 MG tablet Take 1 tablet (40 mg total) by mouth every evening. 90 tablet 3  . rizatriptan (MAXALT) 10 MG tablet Take 10 mg by mouth as needed. May repeat in 2 hours if needed no more than 2 tablet in 24 hours For migraines    . sildenafil (REVATIO) 20 MG tablet 1 tab by mouth as directed 10 tablet 2  . buPROPion (WELLBUTRIN SR) 150 MG 12 hr tablet Take 1 tablet (150 mg total) by mouth 2 (two) times daily. 60 tablet 2  . metoprolol tartrate (LOPRESSOR) 25 MG tablet Take 1 tablet (25 mg total) by mouth 2 (two) times daily. 180 tablet 3  . [DISCONTINUED] vardenafil (LEVITRA) 20 MG tablet Take 20 mg by mouth as directed.       No current facility-administered medications for this visit.    BP 118/70 mmHg  Pulse 83  Ht 6\' 2"  (1.88 m)  Wt 187 lb 12.8 oz (85.186 kg)  BMI 24.10 kg/m2 General: NAD Neck: No JVD, no thyromegaly or thyroid nodule.  Lungs: Prolonged expiratory phase. CV: Nondisplaced PMI.  Heart regular S1/S2, no  S3/S4, 1/6 SEM RUSB.  No peripheral edema.  No carotid bruit.  Normal pedal pulses.  Abdomen: Soft, nontender, no hepatosplenomegaly, no distention.  Neurologic: Alert and oriented x 3.  Psych: Normal affect. Extremities: No clubbing or cyanosis.   Assessment/Plan: 1. HTN: Acceptable.  Continue current meds.   2. Aortic insufficiency: Mild to moderate on last echo.  The aortic valve is trileaflet.  The AI may be related to HTN.  Continue BP control.   Repeat echo will be ordered (2 year followup).    3. Palpitations: Much improved on metoprolol.   4. Smoking: I strongly encouraged him to quit. He wants to try Wellbutrin again, I will give him a prescription.  5. Hyperlipidemia: He is on pravastatin, last lipids were ok.   Followup in 1 year.   Loralie Champagne 05/26/2014

## 2014-06-09 ENCOUNTER — Encounter: Payer: Self-pay | Admitting: Family Medicine

## 2014-06-09 ENCOUNTER — Ambulatory Visit (INDEPENDENT_AMBULATORY_CARE_PROVIDER_SITE_OTHER): Payer: Medicare Other | Admitting: Family Medicine

## 2014-06-09 VITALS — BP 100/64 | HR 60 | Temp 97.9°F | Wt 186.0 lb

## 2014-06-09 DIAGNOSIS — F17200 Nicotine dependence, unspecified, uncomplicated: Secondary | ICD-10-CM

## 2014-06-09 DIAGNOSIS — M65331 Trigger finger, right middle finger: Secondary | ICD-10-CM

## 2014-06-09 DIAGNOSIS — Z0000 Encounter for general adult medical examination without abnormal findings: Secondary | ICD-10-CM

## 2014-06-09 DIAGNOSIS — I10 Essential (primary) hypertension: Secondary | ICD-10-CM

## 2014-06-09 DIAGNOSIS — M62838 Other muscle spasm: Secondary | ICD-10-CM

## 2014-06-09 DIAGNOSIS — Z720 Tobacco use: Secondary | ICD-10-CM

## 2014-06-09 DIAGNOSIS — E785 Hyperlipidemia, unspecified: Secondary | ICD-10-CM

## 2014-06-09 MED ORDER — DIAZEPAM 5 MG PO TABS
ORAL_TABLET | ORAL | Status: DC
Start: 2014-06-09 — End: 2014-07-05

## 2014-06-09 NOTE — Patient Instructions (Signed)
Trigger Finger Trigger finger (digital tendinitis and stenosing tenosynovitis) is a common disorder that causes an often painful catching of the fingers or thumb. It occurs as a clicking, snapping, or locking of a finger in the palm of the hand. This is caused by a problem with the tendons that flex or bend the fingers sliding smoothly through their sheaths. The condition may occur in any finger or a couple fingers at the same time.  The finger may lock with the finger curled or suddenly straighten out with a snap. This is more common in patients with rheumatoid arthritis and diabetes. Left untreated, the condition may get worse to the point where the finger becomes locked in flexion, like making a fist, or less commonly locked with the finger straightened out. CAUSES   Inflammation and scarring that lead to swelling around the tendon sheath.  Repeated or forceful movements.  Rheumatoid arthritis, an autoimmune disease that affects joints.  Gout.  Diabetes mellitus. SIGNS AND SYMPTOMS  Soreness and swelling of your finger.  A painful clicking or snapping as you bend and straighten your finger. DIAGNOSIS  Your health care provider will do a physical exam of your finger to diagnose trigger finger. TREATMENT   Splinting for 6-8 weeks may be helpful.  Nonsteroidal anti-inflammatory medicines (NSAIDs) can help to relieve the pain and inflammation.  Cortisone injections, along with splinting, may speed up recovery. Several injections may be required. Cortisone may give relief after one injection.  Surgery is another treatment that may be used if conservative treatments do not work. Surgery can be minor, without incisions (a cut does not have to be made), and can be done with a needle through the skin.  Other surgical choices involve an open procedure in which the surgeon opens the hand through a small incision and cuts the pulley so the tendon can again slide smoothly. Your hand will still  work fine. HOME CARE INSTRUCTIONS  Apply ice to the injured area, twice per day:  Put ice in a plastic bag.  Place a towel between your skin and the bag.  Leave the ice on for 20 minutes, 3-4 times a day.  Rest your hand often. MAKE SURE YOU:   Understand these instructions.  Will watch your condition.  Will get help right away if you are not doing well or get worse. Document Released: 04/22/2004 Document Revised: 03/05/2013 Document Reviewed: 12/03/2012 Exit[employer] Patient Information 2015 [employer]. This information is not intended to replace advice given to you by your health care provider. Make sure you discuss any questions you have with your health care provider.  

## 2014-06-09 NOTE — Progress Notes (Signed)
Pre visit review using our clinic review tool, if applicable. No additional management support is needed unless otherwise documented below in the visit note. 

## 2014-06-09 NOTE — Progress Notes (Signed)
   Subjective:    Patient ID: Willie Andrews, male    DOB: 20-May-1956, 58 y.o.   MRN: [id number]  HPI Pt is here to f/u bp-- he is seeing cardiology.  No complaints except he c/o R middle fiinger gets stuck when bends.  No trauma.  He also needs refills on meds.   Review of Systems As above    Objective:   Physical Exam BP 100/64 mmHg  Pulse 60  Temp(Src) 97.9 F (36.6 C) (Oral)  Wt 186 lb (84.369 kg)  SpO2 97% General appearance: alert, cooperative, appears stated age and no distress Neck: no adenopathy, supple, symmetrical, trachea midline and thyroid not enlarged, symmetric, no tenderness/mass/nodules Lungs: clear to auscultation bilaterally Heart: S1, S2 normal Extremities: extremities normal, atraumatic, no cyanosis or edema-- r middle finger +trigger finger       Assessment & Plan:  1. Trigger middle finger of right hand   - Ambulatory referral to Hand Surgery  2. Muscle spasm   - diazepam (VALIUM) 5 MG tablet; TAKE ONE TABLET BY MOUTH EVERY 6 HOURS AS NEEDED FOR ANXIETY (SPASMS).  Dispense: 60 tablet; Refill: 0  3. Hyperlipidemia Check labs - Hepatic function panel; Future - Lipid panel; Future  4. Essential hypertension stable - Basic metabolic panel; Future  5. Tobacco use disorder    6. Preventative health care Check antibody for shingles vaccine - Varicella zoster antibody, IgG; Future

## 2014-06-16 ENCOUNTER — Other Ambulatory Visit (INDEPENDENT_AMBULATORY_CARE_PROVIDER_SITE_OTHER): Payer: Medicare Other

## 2014-06-16 ENCOUNTER — Ambulatory Visit (INDEPENDENT_AMBULATORY_CARE_PROVIDER_SITE_OTHER): Payer: No Typology Code available for payment source | Admitting: Licensed Clinical Social Worker

## 2014-06-16 DIAGNOSIS — I10 Essential (primary) hypertension: Secondary | ICD-10-CM

## 2014-06-16 DIAGNOSIS — F4323 Adjustment disorder with mixed anxiety and depressed mood: Secondary | ICD-10-CM

## 2014-06-16 DIAGNOSIS — E785 Hyperlipidemia, unspecified: Secondary | ICD-10-CM

## 2014-06-16 DIAGNOSIS — Z0000 Encounter for general adult medical examination without abnormal findings: Secondary | ICD-10-CM

## 2014-06-16 LAB — HEPATIC FUNCTION PANEL
ALT: 16 U/L (ref 0–53)
AST: 20 U/L (ref 0–37)
Albumin: 4.1 g/dL (ref 3.5–5.2)
Alkaline Phosphatase: 71 U/L (ref 39–117)
BILIRUBIN TOTAL: 0.4 mg/dL (ref 0.2–1.2)
Bilirubin, Direct: 0 mg/dL (ref 0.0–0.3)
Total Protein: 7.1 g/dL (ref 6.0–8.3)

## 2014-06-16 LAB — BASIC METABOLIC PANEL
BUN: 17 mg/dL (ref 6–23)
CO2: 27 mEq/L (ref 19–32)
CREATININE: 1.2 mg/dL (ref 0.4–1.5)
Calcium: 9.1 mg/dL (ref 8.4–10.5)
Chloride: 105 mEq/L (ref 96–112)
GFR: 66.9 mL/min (ref >60.00)
Glucose, Bld: 101 mg/dL — ABNORMAL HIGH (ref 70–99)
Potassium: 4.9 mEq/L (ref 3.5–5.1)
Sodium: 140 mEq/L (ref 135–145)

## 2014-06-16 LAB — LIPID PANEL
Cholesterol: 128 mg/dL (ref 0–200)
HDL: 34.9 mg/dL — ABNORMAL LOW (ref >39.00)
LDL CALC: 57 mg/dL (ref 0–99)
NonHDL: 93.1
TRIGLYCERIDES: 183 mg/dL — AB (ref 0.0–149.0)
Total CHOL/HDL Ratio: 4
VLDL: 36.6 mg/dL (ref 0.0–40.0)

## 2014-06-17 ENCOUNTER — Encounter (HOSPITAL_COMMUNITY): Payer: Self-pay | Admitting: Psychiatry

## 2014-06-17 ENCOUNTER — Ambulatory Visit (INDEPENDENT_AMBULATORY_CARE_PROVIDER_SITE_OTHER): Payer: No Typology Code available for payment source | Admitting: Psychiatry

## 2014-06-17 ENCOUNTER — Other Ambulatory Visit (HOSPITAL_COMMUNITY): Payer: Self-pay

## 2014-06-17 VITALS — BP 150/75 | HR 76 | Ht 74.0 in | Wt 188.8 lb

## 2014-06-17 DIAGNOSIS — F39 Unspecified mood [affective] disorder: Secondary | ICD-10-CM

## 2014-06-17 LAB — VARICELLA ZOSTER ANTIBODY, IGG: Varicella IgG: 1265 Index — ABNORMAL HIGH (ref <135.00)

## 2014-06-17 MED ORDER — LAMOTRIGINE 100 MG PO TABS: 100.0000 mg | ORAL_TABLET | Freq: Every day | ORAL | Status: DC
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
## 2014-06-18 ENCOUNTER — Other Ambulatory Visit (HOSPITAL_COMMUNITY): Payer: Self-pay

## 2014-06-25 ENCOUNTER — Other Ambulatory Visit (HOSPITAL_COMMUNITY): Payer: Self-pay

## 2014-07-05 ENCOUNTER — Other Ambulatory Visit: Payer: Self-pay | Admitting: Family Medicine

## 2014-07-06 NOTE — Telephone Encounter (Signed)
Last seen and filled 06/09/14 UDS 06/24/12 low risk    Please advise    KP

## 2014-07-14 ENCOUNTER — Other Ambulatory Visit: Payer: Self-pay | Admitting: Endocrinology

## 2014-07-20 ENCOUNTER — Other Ambulatory Visit (HOSPITAL_COMMUNITY): Payer: Self-pay

## 2014-07-23 ENCOUNTER — Ambulatory Visit (INDEPENDENT_AMBULATORY_CARE_PROVIDER_SITE_OTHER): Payer: PPO | Admitting: Licensed Clinical Social Worker

## 2014-07-23 DIAGNOSIS — F4323 Adjustment disorder with mixed anxiety and depressed mood: Secondary | ICD-10-CM

## 2014-07-28 ENCOUNTER — Encounter: Payer: Self-pay | Admitting: Physician Assistant

## 2014-07-28 ENCOUNTER — Ambulatory Visit (INDEPENDENT_AMBULATORY_CARE_PROVIDER_SITE_OTHER): Payer: PPO | Admitting: Physician Assistant

## 2014-07-28 VITALS — BP 128/65 | HR 93 | Temp 98.4°F | Resp 16 | Ht 74.0 in | Wt 191.1 lb

## 2014-07-28 DIAGNOSIS — J208 Acute bronchitis due to other specified organisms: Principal | ICD-10-CM

## 2014-07-28 DIAGNOSIS — B9689 Other specified bacterial agents as the cause of diseases classified elsewhere: Secondary | ICD-10-CM | POA: Insufficient documentation

## 2014-07-28 DIAGNOSIS — J00 Acute nasopharyngitis [common cold]: Secondary | ICD-10-CM

## 2014-07-28 MED ORDER — BENZONATATE 100 MG PO CAPS: 100.0000 mg | ORAL_CAPSULE | Freq: Three times a day (TID) | ORAL | Status: DC | PRN

## 2014-07-28 MED ORDER — AZITHROMYCIN 250 MG PO TABS: ORAL_TABLET | ORAL | Status: DC

## 2014-07-28 MED ORDER — DIAZEPAM 5 MG PO TABS: ORAL_TABLET | ORAL | Status: DC

## 2014-07-28 NOTE — Progress Notes (Signed)
Patient presents to clinic today c/o 11-12 days of head congestion, chest congestion, cough productive of thick yellow-green sputum that has remained constant and has been gradually worsening over the past 2 days.  Denies wheezing or SOB.  Denies sinus pain, ear pain or tooth pain.  Is a smoker but has not had a cigarette since symptoms started.  Denies sick contact.  Past Medical History  Diagnosis Date  . Arthritis   . Depression   . High cholesterol   . Kidney stones   . Migraine   . Benign prostatic hypertrophy     Dr. Rosana Hoes  . Hypertension   . Seasonal allergies   . Sleep apnea   . Chronic pain     Current Outpatient Prescriptions on File Prior to Visit  Medication Sig Dispense Refill  . aspirin EC 81 MG tablet Take 1 tablet (81 mg total) by mouth daily.    . benazepril (LOTENSIN) 40 MG tablet Take 1 tablet (40 mg total) by mouth daily. 90 tablet 3  . buPROPion (WELLBUTRIN SR) 150 MG 12 hr tablet Take 1 tablet (150 mg total) by mouth 2 (two) times daily. 60 tablet 2  . clomiPHENE (CLOMID) 50 MG tablet TAKE ONE-FOURTH TABLET BY MOUTH ONCE DAILY 10 tablet 0  . lamoTRIgine (LAMICTAL) 100 MG tablet Take 1 tablet (100 mg total) by mouth daily. 30 tablet 2  . lisinopril (PRINIVIL,ZESTRIL) 40 MG tablet Take 1 tablet by mouth daily.  1  . metoprolol tartrate (LOPRESSOR) 25 MG tablet Take 1 tablet (25 mg total) by mouth 2 (two) times daily. 180 tablet 3  . OxyCODONE (OXYCONTIN) 40 mg T12A 12 hr tablet Take 40 mg by mouth every 12 (twelve) hours.    Marland Kitchen oxyCODONE-acetaminophen (PERCOCET) 10-325 MG per tablet Take 1 tablet by mouth every 8 (eight) hours as needed. For pain    . pravastatin (PRAVACHOL) 40 MG tablet Take 1 tablet (40 mg total) by mouth every evening. 90 tablet 3  . rizatriptan (MAXALT) 10 MG tablet Take 10 mg by mouth as needed. May repeat in 2 hours if needed no more than 2 tablet in 24 hours For migraines    . sildenafil (REVATIO) 20 MG tablet 1 tab by mouth as directed 10  tablet 2  . [DISCONTINUED] vardenafil (LEVITRA) 20 MG tablet Take 20 mg by mouth as directed.       No current facility-administered medications on file prior to visit.    Allergies  Allergen Reactions  . Butrans [Buprenorphine Hcl] Hives and Itching  . Ketorolac Tromethamine Other (See Comments)    toradol   . Opana [Oxymorphone] Other (See Comments)    Leg cramps    Family History  Problem Relation Age of Onset  . Arthritis    . Alcohol abuse    . Cancer Mother     unknown type    History   Social History  . Marital Status: Divorced    Spouse Name: N/A    Number of Children: N/A  . Years of Education: N/A   Occupational History  . disabled    Social History Main Topics  . Smoking status: Current Every Day Smoker -- 1.50 packs/day  . Smokeless tobacco: None  . Alcohol Use: 0.0 oz/week    0 Not specified per week     Comment: 1 drink per night  . Drug Use: No  . Sexual Activity: None   Other Topics Concern  . None   Social History Narrative  Review of Systems - See HPI.  All other ROS are negative.  BP 128/65 mmHg  Pulse 93  Temp(Src) 98.4 F (36.9 C) (Oral)  Resp 16  Ht 6\' 2"  (1.88 m)  Wt 191 lb 2 oz (86.694 kg)  BMI 24.53 kg/m2  SpO2 97%  Physical Exam  Constitutional: He is well-developed, well-nourished, and in no distress.  HENT:  Head: Normocephalic and atraumatic.  Right Ear: External ear normal.  Left Ear: External ear normal.  Nose: Nose normal.  Mouth/Throat: Oropharynx is clear and moist. No oropharyngeal exudate.  TM within normal limits bilaterally.  Cardiovascular: Normal rate, regular rhythm, normal heart sounds and intact distal pulses.   Pulmonary/Chest: Effort normal and breath sounds normal. No respiratory distress. He has no wheezes. He has no rales. He exhibits no tenderness.  Vitals reviewed.  Recent Results (from the past 2160 hour(s))  Basic metabolic panel     Status: Abnormal   Collection Time: 06/16/14  8:34 AM    Result Value Ref Range   Sodium 140 135 - 145 mEq/L   Potassium 4.9 3.5 - 5.1 mEq/L   Chloride 105 96 - 112 mEq/L   CO2 27 19 - 32 mEq/L   Glucose, Bld 101 (H) 70 - 99 mg/dL   BUN 17 6 - 23 mg/dL   Creatinine, Ser 1.2 0.4 - 1.5 mg/dL   Calcium 9.1 8.4 - 10.5 mg/dL   GFR 66.90 >60.00 mL/min  Hepatic function panel     Status: None   Collection Time: 06/16/14  8:34 AM  Result Value Ref Range   Total Bilirubin 0.4 0.2 - 1.2 mg/dL   Bilirubin, Direct 0.0 0.0 - 0.3 mg/dL   Alkaline Phosphatase 71 39 - 117 U/L   AST 20 0 - 37 U/L   ALT 16 0 - 53 U/L   Total Protein 7.1 6.0 - 8.3 g/dL   Albumin 4.1 3.5 - 5.2 g/dL  Lipid panel     Status: Abnormal   Collection Time: 06/16/14  8:34 AM  Result Value Ref Range   Cholesterol 128 0 - 200 mg/dL    Comment: ATP III Classification       Desirable:  < 200 mg/dL               Borderline High:  200 - 239 mg/dL          High:  > = 240 mg/dL   Triglycerides 183.0 (H) 0.0 - 149.0 mg/dL    Comment: Normal:  <150 mg/dLBorderline High:  150 - 199 mg/dL   HDL 34.90 (L) >39.00 mg/dL   VLDL 36.6 0.0 - 40.0 mg/dL   LDL Cholesterol 57 0 - 99 mg/dL   Total CHOL/HDL Ratio 4     Comment:                Men          Women1/2 Average Risk     3.4          3.3Average Risk          5.0          4.42X Average Risk          9.6          7.13X Average Risk          15.0          11.0  NonHDL 93.10     Comment: NOTE:  Non-HDL goal should be 30 mg/dL higher than patient's LDL goal (i.e. LDL goal of < 70 mg/dL, would have non-HDL goal of < 100 mg/dL)  Varicella zoster antibody, IgG     Status: Abnormal   Collection Time: 06/16/14  8:34 AM  Result Value Ref Range   Varicella IgG 1265.00 (H) <135.00 Index    Comment:   Reference Range:       <135.00 Index = Negative                  135.00-164.99 Index = Equivocal                       >=165.00 Index = Positive       Assessment/Plan: Acute bacterial bronchitis Rx Azithromycin.  Increase  fluids.  Rest. Pain Mucinex.  Rx Tessalon. Humidifier in bedroom.  Continue chronic medications as directed.  Return precautions discussed with patient.

## 2014-07-28 NOTE — Patient Instructions (Signed)
Please take antibiotic as directed.  Increase fluids.  Get plenty of rest. Use Tessalon as directed for cough.  Use Plain Mucinex for congestion.  Continue chronic medications as directed.  Call or return to clinic if symptoms are not improving.  Acute Bronchitis Bronchitis is inflammation of the airways that extend from the windpipe into the lungs (bronchi). The inflammation often causes mucus to develop. This leads to a cough, which is the most common symptom of bronchitis.  In acute bronchitis, the condition usually develops suddenly and goes away over time, usually in a couple weeks. Smoking, allergies, and asthma can make bronchitis worse. Repeated episodes of bronchitis may cause further lung problems.  CAUSES Acute bronchitis is most often caused by the same virus that causes a cold. The virus can spread from person to person (contagious) through coughing, sneezing, and touching contaminated objects. SIGNS AND SYMPTOMS   Cough.   Fever.   Coughing up mucus.   Body aches.   Chest congestion.   Chills.   Shortness of breath.   Sore throat.  DIAGNOSIS  Acute bronchitis is usually diagnosed through a physical exam. Your health care provider will also ask you questions about your medical history. Tests, such as chest X-rays, are sometimes done to rule out other conditions.  TREATMENT  Acute bronchitis usually goes away in a couple weeks. Oftentimes, no medical treatment is necessary. Medicines are sometimes given for relief of fever or cough. Antibiotic medicines are usually not needed but may be prescribed in certain situations. In some cases, an inhaler may be recommended to help reduce shortness of breath and control the cough. A cool mist vaporizer may also be used to help thin bronchial secretions and make it easier to clear the chest.  HOME CARE INSTRUCTIONS  Get plenty of rest.   Drink enough fluids to keep your urine clear or pale yellow (unless you have a medical  condition that requires fluid restriction). Increasing fluids may help thin your respiratory secretions (sputum) and reduce chest congestion, and it will prevent dehydration.   Take medicines only as directed by your health care provider.  If you were prescribed an antibiotic medicine, finish it all even if you start to feel better.  Avoid smoking and secondhand smoke. Exposure to cigarette smoke or irritating chemicals will make bronchitis worse. If you are a smoker, consider using nicotine gum or skin patches to help control withdrawal symptoms. Quitting smoking will help your lungs heal faster.   Reduce the chances of another bout of acute bronchitis by washing your hands frequently, avoiding people with cold symptoms, and trying not to touch your hands to your mouth, nose, or eyes.   Keep all follow-up visits as directed by your health care provider.  SEEK MEDICAL CARE IF: Your symptoms do not improve after 1 week of treatment.  SEEK IMMEDIATE MEDICAL CARE IF:  You develop an increased fever or chills.   You have chest pain.   You have severe shortness of breath.  You have bloody sputum.   You develop dehydration.  You faint or repeatedly feel like you are going to pass out.  You develop repeated vomiting.  You develop a severe headache. MAKE SURE YOU:   Understand these instructions.  Will watch your condition.  Will get help right away if you are not doing well or get worse. Document Released: 08/10/2004 Document Revised: 11/17/2013 Document Reviewed: 12/24/2012 [facility] Patient Information 2015 [place], [place]. This information is not intended to replace advice given to  to you by your health care provider. Make sure you discuss any questions you have with your health care provider.  

## 2014-07-28 NOTE — Assessment & Plan Note (Signed)
Rx Azithromycin.  Increase fluids.  Rest. Pain Mucinex.  Rx Tessalon. Humidifier in bedroom.  Continue chronic medications as directed.  Return precautions discussed with patient.

## 2014-07-28 NOTE — Progress Notes (Signed)
Pre visit review using our clinic review tool, if applicable. No additional management support is needed unless otherwise documented below in the visit note/SLS  

## 2014-08-26 ENCOUNTER — Ambulatory Visit (INDEPENDENT_AMBULATORY_CARE_PROVIDER_SITE_OTHER): Payer: PPO | Admitting: Licensed Clinical Social Worker

## 2014-08-26 DIAGNOSIS — F4323 Adjustment disorder with mixed anxiety and depressed mood: Secondary | ICD-10-CM

## 2014-08-28 ENCOUNTER — Telehealth: Payer: Self-pay | Admitting: Family Medicine

## 2014-08-28 DIAGNOSIS — N529 Male erectile dysfunction, unspecified: Secondary | ICD-10-CM

## 2014-08-28 MED ORDER — SILDENAFIL CITRATE 20 MG PO TABS: ORAL_TABLET | ORAL | Status: DC

## 2014-08-28 NOTE — Telephone Encounter (Signed)
Rx faxed.    KP 

## 2014-08-28 NOTE — Telephone Encounter (Signed)
Caller name: gates Relation to pt: self Call back number: [phone] Pharmacy: [facility] in [place]  Reason for call:   Requesting refill of sildenafil (REVATIO)

## 2014-08-31 ENCOUNTER — Other Ambulatory Visit: Payer: Self-pay | Admitting: Family Medicine

## 2014-08-31 NOTE — Telephone Encounter (Signed)
Last seen 06/09/14 and filled 08/06/14 #60  Please advise     KP

## 2014-09-04 ENCOUNTER — Other Ambulatory Visit: Payer: Self-pay

## 2014-09-04 MED ORDER — CLOMIPHENE CITRATE 50 MG PO TABS: ORAL_TABLET | ORAL | Status: DC

## 2014-09-07 ENCOUNTER — Other Ambulatory Visit: Payer: Self-pay | Admitting: *Deleted

## 2014-09-16 ENCOUNTER — Encounter (HOSPITAL_COMMUNITY): Payer: Self-pay | Admitting: Psychiatry

## 2014-09-16 ENCOUNTER — Ambulatory Visit (INDEPENDENT_AMBULATORY_CARE_PROVIDER_SITE_OTHER): Payer: PPO | Admitting: Psychiatry

## 2014-09-16 VITALS — BP 113/68 | HR 65 | Ht 74.0 in | Wt 179.0 lb

## 2014-09-16 DIAGNOSIS — F39 Unspecified mood [affective] disorder: Secondary | ICD-10-CM

## 2014-09-16 MED ORDER — BUPROPION HCL ER (XL) 300 MG PO TB24: 300.0000 mg | ORAL_TABLET | ORAL | Status: DC

## 2014-09-16 MED ORDER — LAMOTRIGINE 150 MG PO TABS
150.0000 mg | ORAL_TABLET | Freq: Every day | ORAL | Status: DC
Start: 2014-09-16 — End: 2014-12-17
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
## 2014-09-18 ENCOUNTER — Encounter: Payer: Self-pay | Admitting: Endocrinology

## 2014-09-18 ENCOUNTER — Ambulatory Visit (INDEPENDENT_AMBULATORY_CARE_PROVIDER_SITE_OTHER): Payer: PPO | Admitting: Endocrinology

## 2014-09-18 VITALS — BP 112/64 | HR 58 | Temp 98.1°F | Ht 74.0 in | Wt 180.0 lb

## 2014-09-18 DIAGNOSIS — N529 Male erectile dysfunction, unspecified: Secondary | ICD-10-CM

## 2014-09-18 DIAGNOSIS — R7989 Other specified abnormal findings of blood chemistry: Secondary | ICD-10-CM

## 2014-09-18 DIAGNOSIS — E291 Testicular hypofunction: Secondary | ICD-10-CM

## 2014-09-18 DIAGNOSIS — N528 Other male erectile dysfunction: Secondary | ICD-10-CM

## 2014-09-18 LAB — TESTOSTERONE: Testosterone: 490.71 ng/dL (ref 300.00–890.00)

## 2014-09-18 LAB — CBC WITH DIFFERENTIAL/PLATELET
BASOS PCT: 0.4 % (ref 0.0–3.0)
Basophils Absolute: 0 10*3/uL (ref 0.0–0.1)
Eosinophils Absolute: 0.3 10*3/uL (ref 0.0–0.7)
Eosinophils Relative: 2.8 % (ref 0.0–5.0)
HCT: 42.7 % (ref 39.0–52.0)
Hemoglobin: 14.5 g/dL (ref 13.0–17.0)
LYMPHS PCT: 30 % (ref 12.0–46.0)
Lymphs Abs: 3 10*3/uL (ref 0.7–4.0)
MCHC: 34 g/dL (ref 30.0–36.0)
MCV: 85.9 fl (ref 78.0–100.0)
MONO ABS: 0.7 10*3/uL (ref 0.1–1.0)
MONOS PCT: 7.3 % (ref 3.0–12.0)
NEUTROS PCT: 59.5 % (ref 43.0–77.0)
Neutro Abs: 5.9 10*3/uL (ref 1.4–7.7)
PLATELETS: 162 10*3/uL (ref 150.0–400.0)
RBC: 4.96 Mil/uL (ref 4.22–5.81)
RDW: 13.4 % (ref 11.5–15.5)
WBC: 10 10*3/uL (ref 4.0–10.5)

## 2014-09-18 MED ORDER — SILDENAFIL CITRATE 20 MG PO TABS: ORAL_TABLET | ORAL | Status: DC

## 2014-09-18 NOTE — Progress Notes (Signed)
Subjective:    Patient ID: Willie Andrews, male    DOB: 03-15-1956, 59 y.o.   MRN: [id number]  HPI Pt returns for f/u of idiopathic central hypogonadism (dx'ed 2013; he had puberty at the normal age; he has 2 biological children; he says he has never taken illicit androgens.; he took androgel x a few weeks, in 2013, but not since then; he was started on clomid in mid-2015). Main symptom is back pain, but he also has ED sxs.  Sometimes viarga helps.  Past Medical History  Diagnosis Date  . Arthritis   . Depression   . High cholesterol   . Kidney stones   . Migraine   . Benign prostatic hypertrophy     Dr. Rosana Hoes  . Hypertension   . Seasonal allergies   . Sleep apnea   . Chronic pain     Past Surgical History  Procedure Laterality Date  . L5-s1    . Elbow surgery      Left 2008  . Neck surgery      Disc 2002 and 2008  . Shoulder surgery      Right  . Back surgery      1989, 1994, 2002, 2008  . Nasal sinus surgery      2004  . Back surgery  2012    Neuro Implant --elect. stimulator  . Spine surgery  85 94    History   Social History  . Marital Status: Divorced    Spouse Name: N/A  . Number of Children: N/A  . Years of Education: N/A   Occupational History  . disabled    Social History Main Topics  . Smoking status: Current Every Day Smoker -- 1.50 packs/day  . Smokeless tobacco: Never Used     Comment: down to 5 cigs a day  . Alcohol Use: 0.0 oz/week    0 Standard drinks or equivalent per week     Comment: 1 drink per night  . Drug Use: No  . Sexual Activity: Not on file   Other Topics Concern  . Not on file   Social History Narrative    Current Outpatient Prescriptions on File Prior to Visit  Medication Sig Dispense Refill  . aspirin EC 81 MG tablet Take 1 tablet (81 mg total) by mouth daily.    . benazepril (LOTENSIN) 40 MG tablet Take 1 tablet (40 mg total) by mouth daily. (Patient not taking: Reported on 09/18/2014) 90 tablet 3  . buPROPion  (WELLBUTRIN XL) 300 MG 24 hr tablet Take 1 tablet (300 mg total) by mouth every morning. (Patient not taking: Reported on 09/18/2014) 30 tablet 2  . clomiPHENE (CLOMID) 50 MG tablet TAKE ONE-FOURTH TABLET BY MOUTH ONCE DAILY (Patient not taking: Reported on 09/18/2014) 10 tablet 2  . diazepam (VALIUM) 5 MG tablet TAKE ONE TABLET BY MOUTH EVERY 6 HOURS AS NEEDED FOR ANXIETY (SPASMS) (Patient not taking: Reported on 09/18/2014) 60 tablet 0  . lamoTRIgine (LAMICTAL) 150 MG tablet Take 1 tablet (150 mg total) by mouth daily. (Patient not taking: Reported on 09/18/2014) 30 tablet 2  . lisinopril (PRINIVIL,ZESTRIL) 40 MG tablet Take 1 tablet by mouth daily.  1  . methadone (DOLOPHINE) 10 MG tablet Takes as directed    . metoprolol tartrate (LOPRESSOR) 25 MG tablet Take 1 tablet (25 mg total) by mouth 2 (two) times daily. (Patient not taking: Reported on 09/18/2014) 180 tablet 3  . oxyCODONE-acetaminophen (PERCOCET) 10-325 MG per tablet Take 1 tablet by  mouth every 8 (eight) hours as needed. For pain    . pravastatin (PRAVACHOL) 40 MG tablet Take 1 tablet (40 mg total) by mouth every evening. (Patient not taking: Reported on 09/18/2014) 90 tablet 3  . rizatriptan (MAXALT) 10 MG tablet Take 10 mg by mouth as needed. May repeat in 2 hours if needed no more than 2 tablet in 24 hours For migraines    . [DISCONTINUED] vardenafil (LEVITRA) 20 MG tablet Take 20 mg by mouth as directed.       No current facility-administered medications on file prior to visit.    Allergies  Allergen Reactions  . Butrans [Buprenorphine Hcl] Hives and Itching  . Ketorolac Tromethamine Other (See Comments)    toradol   . Opana [Oxymorphone] Other (See Comments)    Leg cramps    Family History  Problem Relation Age of Onset  . Arthritis    . Alcohol abuse    . Cancer Mother     unknown type    BP 112/64 mmHg  Pulse 58  Temp(Src) 98.1 F (36.7 C) (Oral)  Ht 6\' 2"  (1.88 m)  Wt 180 lb (81.647 kg)  BMI 23.10 kg/m2  SpO2  90%    Review of Systems  Genitourinary: Negative for difficulty urinating.       Objective:   Physical Exam VITAL SIGNS:  See vs page GENERAL: no distress GENITALIA: Normal male testicles, scrotum, and penis.    Lab Results  Component Value Date   TESTOSTERONE 490.71 09/18/2014       Assessment & Plan:  Hypogonadism, well-controlled Please continue the same medication

## 2014-09-18 NOTE — Patient Instructions (Addendum)
A blood test is requested for you today.  We'll contact you with results. normalization of testosterone is not known to harm you.  however, there are "theoretical" risks, including increased fertility, hair loss, prostate cancer, benign prostate enlargement, blood clots, liver problems, lower hdl ("good cholesterol"), sleep apnea, and behavior changes. Please come back for a follow-up appointment in 1 year.

## 2014-09-21 ENCOUNTER — Telehealth: Payer: Self-pay | Admitting: Family Medicine

## 2014-09-21 NOTE — Telephone Encounter (Signed)
He has lost weight in the last month please call to talk about this

## 2014-09-21 NOTE — Telephone Encounter (Signed)
He really needs to be seen sooner if possible---  MOM is fine--- daily miralax may help

## 2014-09-21 NOTE — Telephone Encounter (Signed)
C/o:  Constipated x 12 days and Unusual weight loss- 12 lbs in 10 days.  States he has gone through 2 larger bottles of Miralax, has increased water and fiber intake, and has tried warm fluids with no relief.  He denies abdominal pain, bloating, n/v, rectal pain or bleeding.  States only other complaint is not having much of an appetite.  He is passing gas without difficulty.    Pt states this is not the first time he has gone this long without a BM, however, he's never lost weight while constipated.  He is also on opioid medications.    Advice:  Pt was advised to come in for an appointment.  Earlier appointment offered.  Pt declined and requested an appointment for Monday of next week.  Appointment scheduled for Monday, September 28, 2014 at 2:30 pm with Dr. Etter Sjogren per his request.  Pt was advised to try MOM and fleets enema and continue to increase fluid and fiber intake.  He was also advised if no relief, symptoms worsen, or new symptoms develop go to UC or ER.  He stated understanding agreed.  He was also told that message would be routed to Dr. Etter Sjogren for further recommendations if any.

## 2014-09-23 ENCOUNTER — Ambulatory Visit (INDEPENDENT_AMBULATORY_CARE_PROVIDER_SITE_OTHER): Payer: PPO | Admitting: Licensed Clinical Social Worker

## 2014-09-23 DIAGNOSIS — F4323 Adjustment disorder with mixed anxiety and depressed mood: Secondary | ICD-10-CM

## 2014-09-28 ENCOUNTER — Encounter (HOSPITAL_BASED_OUTPATIENT_CLINIC_OR_DEPARTMENT_OTHER): Payer: Self-pay

## 2014-09-28 ENCOUNTER — Ambulatory Visit (HOSPITAL_BASED_OUTPATIENT_CLINIC_OR_DEPARTMENT_OTHER)
Admission: RE | Admit: 2014-09-28 | Discharge: 2014-09-28 | Disposition: A | Discharge status: Home / Self Care | Payer: PPO | Source: Ambulatory Visit | Attending: Family Medicine | Admitting: Family Medicine

## 2014-09-28 ENCOUNTER — Encounter: Payer: Self-pay | Admitting: Family Medicine

## 2014-09-28 ENCOUNTER — Ambulatory Visit (INDEPENDENT_AMBULATORY_CARE_PROVIDER_SITE_OTHER): Payer: PPO | Admitting: Family Medicine

## 2014-09-28 ENCOUNTER — Ambulatory Visit: Payer: Self-pay | Admitting: Family Medicine

## 2014-09-28 ENCOUNTER — Telehealth: Payer: Self-pay | Admitting: *Deleted

## 2014-09-28 VITALS — BP 135/82 | HR 82 | Temp 98.8°F | Wt 175.8 lb

## 2014-09-28 DIAGNOSIS — N400 Enlarged prostate without lower urinary tract symptoms: Secondary | ICD-10-CM

## 2014-09-28 DIAGNOSIS — R1030 Lower abdominal pain, unspecified: Secondary | ICD-10-CM

## 2014-09-28 DIAGNOSIS — R634 Abnormal weight loss: Secondary | ICD-10-CM | POA: Diagnosis not present

## 2014-09-28 DIAGNOSIS — F411 Generalized anxiety disorder: Secondary | ICD-10-CM | POA: Diagnosis not present

## 2014-09-28 LAB — POCT URINALYSIS DIPSTICK
BILIRUBIN UA: NEGATIVE
Glucose, UA: NEGATIVE
Leukocytes, UA: NEGATIVE
Nitrite, UA: NEGATIVE
RBC UA: NEGATIVE
UROBILINOGEN UA: 2
pH, UA: 6

## 2014-09-28 MED ORDER — IOHEXOL 300 MG/ML  SOLN
100.0000 mL | Freq: Once | INTRAMUSCULAR | Status: AC | PRN
  Administered 2014-09-28: 100 mL via INTRAVENOUS

## 2014-09-28 NOTE — Telephone Encounter (Signed)
Received call from [place] in radiology letting us know that CT abd/pelvis is normal. Advised him to notify pt and we will call him if any further instructions.  Please advise.

## 2014-09-28 NOTE — Progress Notes (Signed)
Patient ID: Willie Andrews, male    DOB: 1955/12/17  Age: 59 y.o. MRN: [id number]    Subjective:  Subjective HPI Willie Andrews presents for prostate check.  His urine stream is light and he always feels like he is going so frequently.       Review of Systems  History Past Medical History  Diagnosis Date  . Arthritis   . Depression   . High cholesterol   . Kidney stones   . Migraine   . Benign prostatic hypertrophy     Dr. Rosana Hoes  . Hypertension   . Seasonal allergies   . Sleep apnea   . Chronic pain     He has past surgical history that includes L5-S1; Elbow surgery; Neck surgery; Shoulder surgery; Back surgery; Nasal sinus surgery; Back surgery (2012); and Spine surgery (85 94).   His family history includes Alcohol abuse in an other family member; Arthritis in an other family member; Cancer in his mother.He reports that he has been smoking.  He has never used smokeless tobacco. He reports that he drinks alcohol. He reports that he does not use illicit drugs.  Current Outpatient Prescriptions on File Prior to Visit  Medication Sig Dispense Refill  . aspirin EC 81 MG tablet Take 1 tablet (81 mg total) by mouth daily.    . benazepril (LOTENSIN) 40 MG tablet Take 1 tablet (40 mg total) by mouth daily. 90 tablet 3  . buPROPion (WELLBUTRIN XL) 300 MG 24 hr tablet Take 1 tablet (300 mg total) by mouth every morning. 30 tablet 2  . clomiPHENE (CLOMID) 50 MG tablet TAKE ONE-FOURTH TABLET BY MOUTH ONCE DAILY 10 tablet 2  . diazepam (VALIUM) 5 MG tablet TAKE ONE TABLET BY MOUTH EVERY 6 HOURS AS NEEDED FOR ANXIETY (SPASMS) 60 tablet 0  . lamoTRIgine (LAMICTAL) 150 MG tablet Take 1 tablet (150 mg total) by mouth daily. 30 tablet 2  . lisinopril (PRINIVIL,ZESTRIL) 40 MG tablet Take 1 tablet by mouth daily.  1  . methadone (DOLOPHINE) 10 MG tablet Takes as directed    . metoprolol tartrate (LOPRESSOR) 25 MG tablet Take 1 tablet (25 mg total) by mouth 2 (two) times daily. 180 tablet 3  .  oxyCODONE-acetaminophen (PERCOCET) 10-325 MG per tablet Take 1 tablet by mouth every 8 (eight) hours as needed. For pain    . pravastatin (PRAVACHOL) 40 MG tablet Take 1 tablet (40 mg total) by mouth every evening. 90 tablet 3  . rizatriptan (MAXALT) 10 MG tablet Take 10 mg by mouth as needed. May repeat in 2 hours if needed no more than 2 tablet in 24 hours For migraines    . sildenafil (REVATIO) 20 MG tablet 2-5 tabs as needed for ED symptoms 50 tablet 2  . [DISCONTINUED] vardenafil (LEVITRA) 20 MG tablet Take 20 mg by mouth as directed.       No current facility-administered medications on file prior to visit.     Objective:  Objective Physical Exam  Constitutional: He appears well-developed and well-nourished. No distress.  Cardiovascular: Normal rate, regular rhythm and normal heart sounds.   Pulmonary/Chest: Effort normal and breath sounds normal. No respiratory distress.  Abdominal: Soft. There is tenderness. There is guarding. There is no rigidity, no rebound, no tenderness at McBurney's point and negative Murphy's sign.    Genitourinary: Rectum normal. Rectal exam shows no external hemorrhoid, no fissure and anal tone normal. Guaiac negative stool. Prostate is enlarged. Prostate is not tender.  Psychiatric: He has  a normal mood and affect. His behavior is normal. Judgment and thought content normal.  Nursing note and vitals reviewed.  BP 135/82 mmHg  Pulse 82  Temp(Src) 98.8 F (37.1 C) (Oral)  Wt 175 lb 12.8 oz (79.742 kg)  SpO2 95% Wt Readings from Last 3 Encounters:  09/28/14 175 lb 12.8 oz (79.742 kg)  09/18/14 180 lb (81.647 kg)  09/16/14 179 lb (81.194 kg)     Lab Results  Component Value Date   WBC 10.0 09/18/2014   HGB 14.5 09/18/2014   HCT 42.7 09/18/2014   PLT 162.0 09/18/2014   GLUCOSE 101* 06/16/2014   CHOL 128 06/16/2014   TRIG 183.0* 06/16/2014   HDL 34.90* 06/16/2014   LDLDIRECT 93.6 07/22/2013   LDLCALC 57 06/16/2014   ALT 16 06/16/2014   AST  20 06/16/2014   NA 140 06/16/2014   K 4.9 06/16/2014   CL 105 06/16/2014   CREATININE 1.2 06/16/2014   BUN 17 06/16/2014   CO2 27 06/16/2014   TSH 1.02 03/03/2014   PSA 1.24 03/03/2014   HGBA1C 6.2 11/08/2012    No results found.   Assessment & Plan:  Plan I am having Willie Andrews maintain his rizatriptan, oxyCODONE-acetaminophen, aspirin EC, metoprolol tartrate, pravastatin, benazepril, lisinopril, diazepam, clomiPHENE, methadone, lamoTRIgine, buPROPion, and sildenafil.  No orders of the defined types were placed in this encounter.    Problem List Items Addressed This Visit      Unprioritized   Abdominal pain, lower    Check CT abd and labs If symptoms worsen go to ER        Other Visit Diagnoses    BPH (benign prostatic hypertrophy)    -  Primary    Relevant Orders    Basic metabolic panel    CBC with Differential/Platelet    HIV antibody    PSA    Hepatic function panel    TSH    CT Abdomen Pelvis W Contrast (Completed)    Basic metabolic panel    CBC with Differential/Platelet    Hepatic function panel    POCT urinalysis dipstick (Completed)    PSA    Loss of weight        Relevant Orders    Basic metabolic panel    CBC with Differential/Platelet    HIV antibody    PSA    Hepatic function panel    TSH    CT Abdomen Pelvis W Contrast (Completed)    Basic metabolic panel    CBC with Differential/Platelet    Hepatic function panel    POCT urinalysis dipstick (Completed)    PSA    Generalized anxiety disorder        Lower abdominal pain        Relevant Orders    CT Abdomen Pelvis W Contrast (Completed)    Basic metabolic panel    CBC with Differential/Platelet    Hepatic function panel    POCT urinalysis dipstick (Completed)    PSA       Follow-up: Return if symptoms worsen or fail to improve.  Garnet Koyanagi, DO

## 2014-09-28 NOTE — Patient Instructions (Signed)
Benign Prostatic Hypertrophy The prostate gland is part of the reproductive system of men. A normal prostate is about the size and shape of a walnut. The prostate gland produces a fluid that is mixed with sperm to make semen. This gland surrounds the urethra and is located in front of the rectum and just below the bladder. The bladder is where urine is stored. The urethra is the tube through which urine passes from the bladder to get out of the body. The prostate grows as a man ages. An enlarged prostate not caused by cancer is called benign prostatic hypertrophy (BPH). An enlarged prostate can press on the urethra. This can make it harder to pass urine. In the early stages of enlargement, the bladder can get by with a narrowed urethra by forcing the urine through. If the problem gets worse, medical or surgical treatment may be required.  This condition should be followed by your health care provider. The accumulation of urine in the bladder can cause infection. Back pressure and infection can progress to bladder damage and kidney (renal) failure. If needed, your health care provider may refer you to a specialist in kidney and prostate disease (urologist). CAUSES  BPH is a common health problem in men older than 50 years. This condition is a normal part of aging. However, not all men will develop problems from this condition. If the enlargement grows away from the urethra, then there will not be any compression of the urethra and resistance to urine flow.If the growth is toward the urethra and compresses it, you will experience difficulty urinating.  SYMPTOMS   Not able to completely empty your bladder.  Getting up often during the night to urinate.  Need to urinate frequently during the day.  Difficultly starting urine flow.  Decrease in size and strength of your urine stream.  Dribbling after urination.  Pain on urination (more common with infection).  Inability to pass urine. This needs  immediate treatment.  The development of a urinary tract infection. DIAGNOSIS  These tests will help your health care provider understand your problem:  A thorough history and physical examination.  A urination history, with the number of times you urinate, the amounts of urine, the strength of the urine stream, and the feeling of emptiness or fullness after urinating.  A postvoid bladder scan that measures any amount of urine that may remain in your bladder after you finish urinating.  Digital rectal exam. In a rectal exam, your health care provider checks your prostate by putting a gloved, lubricated finger into your rectum to feel the back of your prostate gland. This exam detects the size of your gland and abnormal lumps or growths.  Exam of your urine (urinalysis).  Prostate specific antigen (PSA) screening. This is a blood test used to screen for prostate cancer.  Rectal ultrasonography. This test uses sound waves to electronically produce a picture of your prostate gland. TREATMENT  Once symptoms begin, your health care provider will monitor your condition. Of the men with this condition, one third will have symptoms that stabilize, one third will have symptoms that improve, and one third will have symptoms that progress in the first year. Mild symptoms may not need treatment. Simple observation and yearly exams may be all that is required. Medicines and surgery are options for more severe problems. Your health care provider can help you make an informed decision for what is best. Two classes of medicines are available for relief of prostate symptoms:  Medicines  that shrink the prostate. This helps relieve symptoms. These medicines take time to work, and it may be months before any improvement is seen.  Uncommon side effects include problems with sexual function.  Medicines to relax the muscle of the prostate. This also relieves the obstruction by reducing any compression on the  urethra.This group of medicines work much faster than those that reduce the size of the prostate gland. Usually, one can experience improvement in days to weeks..  Side effects can include dizziness, fatigue, lightheadedness, and retrograde ejaculation (diminished volume of ejaculate). Several types of surgical treatments are available for relief of prostate symptoms:  Transurethral resection of the prostate (TURP)--In this treatment, an instrument is inserted through opening at the tip of the penis. It is used to cut away pieces of the inner core of the prostate. The pieces are removed through the same opening of the penis. This removes the obstruction and helps get rid of the symptoms.  Transurethral incision (TUIP)--In this procedure, small cuts are made in the prostate. This lessens the prostates pressure on the urethra.  Transurethral microwave thermotherapy (TUMT)--This procedure uses microwaves to create heat. The heat destroys and removes a small amount of prostate tissue.  Transurethral needle ablation (TUNA)--This is a procedure that uses radio frequencies to do the same as TUMT.  Interstitial laser coagulation (ILC)--This is a procedure that uses a laser to do the same as TUMT and TUNA.  Transurethral electrovaporization (TUVP)--This is a procedure that uses electrodes to do the same as the procedures listed above. SEEK MEDICAL CARE IF:   You develop a fever.  There is unexplained back pain.  Symptoms are not helped by medicines prescribed.  You develop side effects from the medicine you are taking.  Your urine becomes very dark or has a bad smell.  Your lower abdomen becomes distended and you have difficulty passing your urine. SEEK IMMEDIATE MEDICAL CARE IF:   You are suddenly unable to urinate. This is an emergency. You should be seen immediately.  There are large amounts of blood or clots in the urine.  Your urinary problems become unmanageable.  You develop  lightheadedness, severe dizziness, or you feel faint.  You develop moderate to severe low back or flank pain.  You develop chills or fever. Document Released: 07/03/2005 Document Revised: 07/08/2013 Document Reviewed: 01/16/2013 [facility] Patient Information 2015 [place], [place]. This information is not intended to replace advice given to you by your health care provider. Make sure you discuss any questions you have with your health care provider.

## 2014-09-28 NOTE — Assessment & Plan Note (Signed)
Check CT abd and labs If symptoms worsen go to ER

## 2014-09-28 NOTE — Progress Notes (Signed)
Pre visit review using our clinic review tool, if applicable. No additional management support is needed unless otherwise documented below in the visit note. 

## 2014-09-28 NOTE — Telephone Encounter (Signed)
See ct scan--- pt aware per tricia But he needs to know to take bactrim

## 2014-09-29 LAB — BASIC METABOLIC PANEL
BUN: 9 mg/dL (ref 6–23)
CO2: 30 meq/L (ref 19–32)
Calcium: 9.6 mg/dL (ref 8.4–10.5)
Chloride: 106 mEq/L (ref 96–112)
Creatinine, Ser: 1.01 mg/dL (ref 0.40–1.50)
GFR: 80.76 mL/min (ref >60.00)
GLUCOSE: 122 mg/dL — AB (ref 70–99)
POTASSIUM: 4.1 meq/L (ref 3.5–5.1)
Sodium: 141 mEq/L (ref 135–145)

## 2014-09-29 LAB — HEPATIC FUNCTION PANEL
ALT: 14 U/L (ref 0–53)
AST: 16 U/L (ref 0–37)
Albumin: 4.4 g/dL (ref 3.5–5.2)
Alkaline Phosphatase: 70 U/L (ref 39–117)
BILIRUBIN DIRECT: 0.1 mg/dL (ref 0.0–0.3)
BILIRUBIN TOTAL: 0.4 mg/dL (ref 0.2–1.2)
Total Protein: 7 g/dL (ref 6.0–8.3)

## 2014-09-29 LAB — CBC WITH DIFFERENTIAL/PLATELET
BASOS ABS: 0 10*3/uL (ref 0.0–0.1)
Basophils Relative: 0.6 % (ref 0.0–3.0)
EOS PCT: 1.8 % (ref 0.0–5.0)
Eosinophils Absolute: 0.1 10*3/uL (ref 0.0–0.7)
HEMATOCRIT: 44.1 % (ref 39.0–52.0)
Hemoglobin: 15 g/dL (ref 13.0–17.0)
LYMPHS ABS: 2.1 10*3/uL (ref 0.7–4.0)
Lymphocytes Relative: 25.2 % (ref 12.0–46.0)
MCHC: 34 g/dL (ref 30.0–36.0)
MCV: 86.4 fl (ref 78.0–100.0)
Monocytes Absolute: 0.6 10*3/uL (ref 0.1–1.0)
Monocytes Relative: 6.9 % (ref 3.0–12.0)
NEUTROS ABS: 5.4 10*3/uL (ref 1.4–7.7)
Neutrophils Relative %: 65.5 % (ref 43.0–77.0)
Platelets: 165 10*3/uL (ref 150.0–400.0)
RBC: 5.11 Mil/uL (ref 4.22–5.81)
RDW: 13.7 % (ref 11.5–15.5)
WBC: 8.3 10*3/uL (ref 4.0–10.5)

## 2014-09-29 LAB — TSH: TSH: 0.64 u[IU]/mL (ref 0.35–4.50)

## 2014-09-29 LAB — HIV ANTIBODY (ROUTINE TESTING W REFLEX): HIV 1&2 Ab, 4th Generation: NONREACTIVE

## 2014-09-29 LAB — PSA: PSA: 1.78 ng/mL (ref 0.10–4.00)

## 2014-09-29 MED ORDER — SULFAMETHOXAZOLE-TRIMETHOPRIM 800-160 MG PO TABS: 1.0000 | ORAL_TABLET | Freq: Two times a day (BID) | ORAL | Status: DC

## 2014-09-29 NOTE — Telephone Encounter (Signed)
Notes Recorded by Rosalita Chessman, DO on 09/28/2014 at 7:55 PM Normal  Bactrim ds 1 po bid x 14 days    patient has been made aware and has agreed to take the Bactrim, he said he made another BM last night but the prostate is still the same, I made him aware the abx should help but if no relief to call and he agreed to follow up prn.      KP

## 2014-09-30 ENCOUNTER — Encounter (HOSPITAL_COMMUNITY): Payer: Self-pay | Admitting: Emergency Medicine

## 2014-09-30 ENCOUNTER — Emergency Department (HOSPITAL_COMMUNITY): Payer: PPO

## 2014-09-30 ENCOUNTER — Emergency Department (HOSPITAL_COMMUNITY)
Admission: EM | Admit: 2014-09-30 | Discharge: 2014-09-30 | Disposition: A | Discharge status: Home / Self Care | Payer: PPO | Attending: Emergency Medicine | Admitting: Emergency Medicine

## 2014-09-30 ENCOUNTER — Other Ambulatory Visit: Payer: Self-pay

## 2014-09-30 DIAGNOSIS — E780 Pure hypercholesterolemia: Secondary | ICD-10-CM | POA: Diagnosis not present

## 2014-09-30 DIAGNOSIS — Y998 Other external cause status: Secondary | ICD-10-CM | POA: Insufficient documentation

## 2014-09-30 DIAGNOSIS — Y92410 Unspecified street and highway as the place of occurrence of the external cause: Secondary | ICD-10-CM | POA: Insufficient documentation

## 2014-09-30 DIAGNOSIS — Z720 Tobacco use: Secondary | ICD-10-CM | POA: Diagnosis not present

## 2014-09-30 DIAGNOSIS — G8929 Other chronic pain: Secondary | ICD-10-CM | POA: Insufficient documentation

## 2014-09-30 DIAGNOSIS — Y9389 Activity, other specified: Secondary | ICD-10-CM | POA: Insufficient documentation

## 2014-09-30 DIAGNOSIS — S61512A Laceration without foreign body of left wrist, initial encounter: Secondary | ICD-10-CM | POA: Diagnosis not present

## 2014-09-30 DIAGNOSIS — N400 Enlarged prostate without lower urinary tract symptoms: Secondary | ICD-10-CM | POA: Diagnosis not present

## 2014-09-30 DIAGNOSIS — I10 Essential (primary) hypertension: Secondary | ICD-10-CM | POA: Diagnosis not present

## 2014-09-30 DIAGNOSIS — Z8669 Personal history of other diseases of the nervous system and sense organs: Secondary | ICD-10-CM | POA: Insufficient documentation

## 2014-09-30 DIAGNOSIS — S3992XA Unspecified injury of lower back, initial encounter: Secondary | ICD-10-CM | POA: Diagnosis not present

## 2014-09-30 DIAGNOSIS — Z79899 Other long term (current) drug therapy: Secondary | ICD-10-CM | POA: Diagnosis not present

## 2014-09-30 DIAGNOSIS — Z7982 Long term (current) use of aspirin: Secondary | ICD-10-CM | POA: Insufficient documentation

## 2014-09-30 DIAGNOSIS — F329 Major depressive disorder, single episode, unspecified: Secondary | ICD-10-CM | POA: Diagnosis not present

## 2014-09-30 DIAGNOSIS — Z87442 Personal history of urinary calculi: Secondary | ICD-10-CM | POA: Diagnosis not present

## 2014-09-30 DIAGNOSIS — M1990 Unspecified osteoarthritis, unspecified site: Secondary | ICD-10-CM | POA: Insufficient documentation

## 2014-09-30 DIAGNOSIS — S60212A Contusion of left wrist, initial encounter: Secondary | ICD-10-CM | POA: Diagnosis not present

## 2014-09-30 DIAGNOSIS — S6992XA Unspecified injury of left wrist, hand and finger(s), initial encounter: Secondary | ICD-10-CM | POA: Diagnosis present

## 2014-09-30 DIAGNOSIS — G43909 Migraine, unspecified, not intractable, without status migrainosus: Secondary | ICD-10-CM | POA: Diagnosis not present

## 2014-09-30 MED ORDER — HYDROMORPHONE HCL 1 MG/ML IJ SOLN
1.0000 mg | Freq: Once | INTRAMUSCULAR | Status: AC
  Administered 2014-09-30: 1 mg via INTRAVENOUS
  Filled 2014-09-30: qty 1

## 2014-09-30 MED ORDER — HYDROMORPHONE HCL 1 MG/ML IJ SOLN
1.0000 mg | Freq: Once | INTRAMUSCULAR | Status: AC
Start: 2014-09-30 — End: 2014-09-30
  Administered 2014-09-30: 1 mg via INTRAVENOUS
  Filled 2014-09-30: qty 1
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
## 2014-09-30 NOTE — Telephone Encounter (Signed)
error 

## 2014-09-30 NOTE — Discharge Instructions (Signed)
Contusion °A contusion is a deep bruise. Contusions happen when an injury causes bleeding under the skin. Signs of bruising include pain, puffiness (swelling), and discolored skin. The contusion may turn blue, purple, or yellow. °HOME CARE  °· Put ice on the injured area. °¨ Put ice in a plastic bag. °¨ Place a towel between your skin and the bag. °¨ Leave the ice on for 15-20 minutes, 03-04 times a day. °· Only take medicine as told by your doctor. °· Rest the injured area. °· If possible, raise (elevate) the injured area to lessen puffiness. °GET HELP RIGHT AWAY IF:  °· You have more bruising or puffiness. °· You have pain that is getting worse. °· Your puffiness or pain is not helped by medicine. °MAKE SURE YOU:  °· Understand these instructions. °· Will watch your condition. °· Will get help right away if you are not doing well or get worse. °Document Released: 12/20/2007 Document Revised: 09/25/2011 Document Reviewed: 05/08/2011 °[employer]® Patient Information ©2015 [employer]. This information is not intended to replace advice given to you by your health care provider. Make sure you discuss any questions you have with your health care provider. ° °

## 2014-10-01 ENCOUNTER — Other Ambulatory Visit: Payer: Self-pay

## 2014-10-01 DIAGNOSIS — N4281 Prostatodynia syndrome: Secondary | ICD-10-CM

## 2014-10-07 ENCOUNTER — Ambulatory Visit: Payer: PPO | Admitting: Licensed Clinical Social Worker

## 2014-10-07 ENCOUNTER — Other Ambulatory Visit: Payer: Self-pay | Admitting: Family Medicine

## 2014-10-08 NOTE — Telephone Encounter (Signed)
Last seen 09/28/14 and filled 08/31/14 #60   Please advise     KP

## 2014-10-12 ENCOUNTER — Telehealth: Payer: Self-pay | Admitting: Endocrinology

## 2014-10-12 MED ORDER — CLOMIPHENE CITRATE 50 MG PO TABS: ORAL_TABLET | ORAL | Status: DC

## 2014-10-12 NOTE — Telephone Encounter (Signed)
Patient called and would like his Clomid called in to [facility]    Thank you

## 2014-10-12 NOTE — Telephone Encounter (Signed)
Rx sent to pharmacy   

## 2014-10-27 ENCOUNTER — Ambulatory Visit: Payer: Self-pay | Admitting: Licensed Clinical Social Worker

## 2014-11-03 ENCOUNTER — Ambulatory Visit (INDEPENDENT_AMBULATORY_CARE_PROVIDER_SITE_OTHER): Payer: PPO | Admitting: Licensed Clinical Social Worker

## 2014-11-03 DIAGNOSIS — F4323 Adjustment disorder with mixed anxiety and depressed mood: Secondary | ICD-10-CM

## 2014-11-05 ENCOUNTER — Telehealth: Payer: Self-pay | Admitting: Cardiology
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
## 2014-11-05 NOTE — Telephone Encounter (Signed)
PCP re: cognitive issues.   Regarding BP, would have him come in for nursing check and get orthostatics. Have him get BP cuff for use at home if his insurance will cover it and check BP daily.  Call list of BP readings in for me in 2-3 weeks.

## 2014-11-06 ENCOUNTER — Telehealth: Payer: Self-pay | Admitting: Cardiology

## 2014-11-06 NOTE — Telephone Encounter (Signed)
New message      Pt was told by Nevin Bloodgood to call today and give her bp readings-- yesterday 106/61 and today 98/58.  Pt want to talk to someone to see if he needs to keep his appt next week

## 2014-11-06 NOTE — Telephone Encounter (Signed)
LMTCB

## 2014-11-06 NOTE — Telephone Encounter (Signed)
Pt advised to keep appt scheduled 11/10/14 for orthostatic BP check. Pt advised to continue to take and record his BP and call with readings in 2-3 weeks.

## 2014-11-06 NOTE — Telephone Encounter (Signed)
Would get orthostatic BP check and continue to record BPs

## 2014-11-06 NOTE — Telephone Encounter (Signed)
Pt advised,verbalized understanding. 

## 2014-11-06 NOTE — Telephone Encounter (Signed)
Pt advised I will forward to Dr Aundra Dubin for review.

## 2014-11-10 ENCOUNTER — Encounter: Payer: Self-pay | Admitting: *Deleted

## 2014-11-10 ENCOUNTER — Ambulatory Visit (INDEPENDENT_AMBULATORY_CARE_PROVIDER_SITE_OTHER): Payer: PPO | Admitting: *Deleted

## 2014-11-10 VITALS — BP 103/61 | HR 51 | Wt 171.0 lb

## 2014-11-10 DIAGNOSIS — I10 Essential (primary) hypertension: Secondary | ICD-10-CM | POA: Diagnosis not present

## 2014-11-10 DIAGNOSIS — R42 Dizziness and giddiness: Secondary | ICD-10-CM | POA: Diagnosis not present

## 2014-11-10 MED ORDER — BENAZEPRIL HCL 20 MG PO TABS: 20.0000 mg | ORAL_TABLET | Freq: Every day | ORAL | Status: DC

## 2014-11-10 NOTE — Patient Instructions (Signed)
Medication Instructions:  1) DECREASE Benazepril to 20mg  daily  Labwork: BMET and CBC same day as your appointment with Richardson Dopp, PA on 5/18  Testing/Procedures: None  Follow-Up: Follow up with Richardson Dopp, PA-C on 12/02/14 at 3:20pm  Any Other Special Instructions Will Be Listed Below (If Applicable).  Continue to monitor and record your blood pressure at home.  Please bring these readings with you to your appointment on 12/02/14.

## 2014-11-10 NOTE — Progress Notes (Addendum)
Pt of Dr. Aundra Dubin in today for orthostatic BPs due to dizziness, elevated BP and "brain fog". Pt was last seen by Dr. Aundra Dubin on 05/26/14. Pt states that he does not have dizziness everyday but that it does occur every 2-3 days, depends on the amount of walking he does. Pt states that the dizziness began about two weeks ago and that he occasionally feels like he is going to pass out. Pt denies any other symptoms- no CP, no SOB. Pt states that he is taking his prescribed Percocet three times daily for pain in his back and neck. Orthostatic BP's performed" Lying- 112/58, 51 Sitting- 103/61, 71 Standing- 103/55, 66 Standing after 3 minutes- 101/58, 66 Spoke with Richardson Dopp, PA-C in regards to pt's dizziness and feelings of almost blacking out. New orders received to decrease Benazepril to 20mg  daily, BMET, CBC and follow up in 2 weeks. Pt made appointment to see Richardson Dopp, PA-C on 12/02/14.

## 2014-11-10 NOTE — Progress Notes (Signed)
Agree with cutting back on benazepril

## 2014-11-10 NOTE — Progress Notes (Signed)
Agree. Note made that patient taking Percocet Three times a day for pain. This is likely impacting his symptoms as well. FU as planned. Richardson Dopp, PA-C   11/10/2014 3:46 PM

## 2014-11-12 ENCOUNTER — Ambulatory Visit (INDEPENDENT_AMBULATORY_CARE_PROVIDER_SITE_OTHER): Payer: PPO | Admitting: Family Medicine

## 2014-11-12 ENCOUNTER — Encounter: Payer: Self-pay | Admitting: Family Medicine

## 2014-11-12 DIAGNOSIS — F4322 Adjustment disorder with anxiety: Secondary | ICD-10-CM | POA: Diagnosis not present

## 2014-11-12 DIAGNOSIS — I959 Hypotension, unspecified: Secondary | ICD-10-CM | POA: Diagnosis not present

## 2014-11-12 DIAGNOSIS — K219 Gastro-esophageal reflux disease without esophagitis: Secondary | ICD-10-CM

## 2014-11-12 MED ORDER — PANTOPRAZOLE SODIUM 40 MG PO TBEC: 40.0000 mg | DELAYED_RELEASE_TABLET | Freq: Every day | ORAL | Status: DC

## 2014-11-12 MED ORDER — DIAZEPAM 5 MG PO TABS: ORAL_TABLET | ORAL | Status: DC

## 2014-11-12 NOTE — Patient Instructions (Signed)

## 2014-11-12 NOTE — Progress Notes (Signed)
Pre visit review using our clinic review tool, if applicable. No additional management support is needed unless otherwise documented below in the visit note. 

## 2014-11-12 NOTE — Assessment & Plan Note (Signed)
Cardiology lowered benazapril He has f/u in 2-3 weeks
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
## 2014-12-02 ENCOUNTER — Other Ambulatory Visit (INDEPENDENT_AMBULATORY_CARE_PROVIDER_SITE_OTHER): Payer: PPO | Admitting: *Deleted

## 2014-12-02 ENCOUNTER — Ambulatory Visit (INDEPENDENT_AMBULATORY_CARE_PROVIDER_SITE_OTHER): Payer: PPO | Admitting: Physician Assistant

## 2014-12-02 ENCOUNTER — Encounter: Payer: Self-pay | Admitting: Physician Assistant

## 2014-12-02 VITALS — BP 114/62 | HR 71 | Ht 74.0 in | Wt 174.0 lb

## 2014-12-02 DIAGNOSIS — I10 Essential (primary) hypertension: Secondary | ICD-10-CM

## 2014-12-02 DIAGNOSIS — R634 Abnormal weight loss: Secondary | ICD-10-CM

## 2014-12-02 DIAGNOSIS — F17200 Nicotine dependence, unspecified, uncomplicated: Secondary | ICD-10-CM

## 2014-12-02 DIAGNOSIS — E785 Hyperlipidemia, unspecified: Secondary | ICD-10-CM | POA: Diagnosis not present

## 2014-12-02 DIAGNOSIS — I359 Nonrheumatic aortic valve disorder, unspecified: Secondary | ICD-10-CM

## 2014-12-02 DIAGNOSIS — R42 Dizziness and giddiness: Secondary | ICD-10-CM

## 2014-12-02 DIAGNOSIS — Z720 Tobacco use: Secondary | ICD-10-CM

## 2014-12-02 DIAGNOSIS — R002 Palpitations: Secondary | ICD-10-CM

## 2014-12-02 LAB — BASIC METABOLIC PANEL
BUN: 11 mg/dL (ref 6–23)
CALCIUM: 9.3 mg/dL (ref 8.4–10.5)
CO2: 32 meq/L (ref 19–32)
CREATININE: 1.22 mg/dL (ref 0.40–1.50)
Chloride: 105 mEq/L (ref 96–112)
GFR: 64.9 mL/min (ref >60.00)
Glucose, Bld: 129 mg/dL — ABNORMAL HIGH (ref 70–99)
Potassium: 4.4 mEq/L (ref 3.5–5.1)
Sodium: 140 mEq/L (ref 135–145)

## 2014-12-02 LAB — CBC
HCT: 37.3 % — ABNORMAL LOW (ref 39.0–52.0)
Hemoglobin: 12.6 g/dL — ABNORMAL LOW (ref 13.0–17.0)
MCHC: 33.7 g/dL (ref 30.0–36.0)
MCV: 85.7 fl (ref 78.0–100.0)
Platelets: 178 10*3/uL (ref 150.0–400.0)
RBC: 4.36 Mil/uL (ref 4.22–5.81)
RDW: 14.8 % (ref 11.5–15.5)
WBC: 7.1 10*3/uL (ref 4.0–10.5)

## 2014-12-02 LAB — HEPATIC FUNCTION PANEL
ALBUMIN: 3.9 g/dL (ref 3.5–5.2)
ALK PHOS: 59 U/L (ref 39–117)
ALT: 10 U/L (ref 0–53)
AST: 13 U/L (ref 0–37)
BILIRUBIN TOTAL: 0.5 mg/dL (ref 0.2–1.2)
Bilirubin, Direct: 0.1 mg/dL (ref 0.0–0.3)
TOTAL PROTEIN: 6.6 g/dL (ref 6.0–8.3)

## 2014-12-02 LAB — TSH: TSH: 0.99 u[IU]/mL (ref 0.35–4.50)

## 2014-12-02 NOTE — Patient Instructions (Signed)
Medication Instructions:  1. STOP LISINOPRIL  Labwork: TODAY; BMET, CBC W/DIFF, TSH, LFT  Testing/Procedures: Your physician has requested that you have an echocardiogram. Echocardiography is a painless test that uses sound waves to create images of your heart. It provides your doctor with information about the size and shape of your heart and how well your heart's chambers and valves are working. This procedure takes approximately one hour. There are no restrictions for this procedure.   Follow-Up: Your physician wants you to follow-up in: [place] DR. Aundra Dubin You will receive a reminder letter in the mail two months in advance. If you don't receive a letter, please call our office to schedule the follow-up appointment.   Any Other Special Instructions Will Be Listed Below (If Applicable). INCREASE FLUIDS (H2O); CALL IF BP ABOVE 140/90
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
## 2014-12-03 ENCOUNTER — Telehealth: Payer: Self-pay | Admitting: *Deleted

## 2014-12-03 ENCOUNTER — Ambulatory Visit (HOSPITAL_COMMUNITY)
Admission: RE | Admit: 2014-12-03 | Discharge: 2014-12-03 | Disposition: A | Discharge status: Home / Self Care | Payer: PPO | Source: Ambulatory Visit | Attending: Physician Assistant | Admitting: Physician Assistant

## 2014-12-03 DIAGNOSIS — R634 Abnormal weight loss: Secondary | ICD-10-CM | POA: Diagnosis present

## 2014-12-03 DIAGNOSIS — I10 Essential (primary) hypertension: Secondary | ICD-10-CM | POA: Diagnosis not present

## 2014-12-03 DIAGNOSIS — F17200 Nicotine dependence, unspecified, uncomplicated: Secondary | ICD-10-CM

## 2014-12-03 NOTE — Telephone Encounter (Signed)
Pt notified of cxr results with verbal understanding and to f/u w/PCP in regards to weight loss.

## 2014-12-03 NOTE — Telephone Encounter (Signed)
Pt notified of lab results by phone with verbal understanding. Pt advised to f/u w/PCP in regards to mild anemia.

## 2014-12-03 NOTE — Telephone Encounter (Signed)
Called pt to go over lab results when pt aksed if I could cb after lunch today since he was leaving for another dr. appt . I said no problem.

## 2014-12-08 ENCOUNTER — Ambulatory Visit (HOSPITAL_COMMUNITY): Payer: PPO | Attending: Physician Assistant

## 2014-12-08 ENCOUNTER — Other Ambulatory Visit: Payer: Self-pay

## 2014-12-08 DIAGNOSIS — I340 Nonrheumatic mitral (valve) insufficiency: Secondary | ICD-10-CM | POA: Insufficient documentation

## 2014-12-08 DIAGNOSIS — I359 Nonrheumatic aortic valve disorder, unspecified: Secondary | ICD-10-CM | POA: Diagnosis not present

## 2014-12-08 DIAGNOSIS — I351 Nonrheumatic aortic (valve) insufficiency: Secondary | ICD-10-CM | POA: Insufficient documentation

## 2014-12-09 ENCOUNTER — Encounter: Payer: Self-pay | Admitting: Physician Assistant

## 2014-12-10 ENCOUNTER — Ambulatory Visit: Payer: Self-pay | Admitting: Family Medicine

## 2014-12-10 ENCOUNTER — Telehealth: Payer: Self-pay | Admitting: Family Medicine

## 2014-12-10 NOTE — Telephone Encounter (Signed)
Caller name: Herrick Hartog Relationship to patient: self Can be reached: [phone]   Reason for call: Pt states he is to bring xrays with him to appt scheduled 5/31. He doesn't remember what xrays he is to bring. Please follow up with pt.

## 2014-12-10 NOTE — Telephone Encounter (Signed)
Spoke with patient and made him aware he was only to have a BP follow up done there is no mention of X-rays in the last visit. He voiced understanding.       KP

## 2014-12-15 ENCOUNTER — Ambulatory Visit: Payer: PPO | Admitting: Family Medicine

## 2014-12-17 ENCOUNTER — Ambulatory Visit (INDEPENDENT_AMBULATORY_CARE_PROVIDER_SITE_OTHER): Payer: PPO | Admitting: Psychiatry

## 2014-12-17 ENCOUNTER — Encounter (HOSPITAL_COMMUNITY): Payer: Self-pay | Admitting: Psychiatry

## 2014-12-17 VITALS — BP 124/78 | HR 61 | Ht 74.0 in | Wt 175.0 lb

## 2014-12-17 DIAGNOSIS — F39 Unspecified mood [affective] disorder: Secondary | ICD-10-CM | POA: Diagnosis not present

## 2014-12-17 DIAGNOSIS — F319 Bipolar disorder, unspecified: Secondary | ICD-10-CM

## 2014-12-17 MED ORDER — BUPROPION HCL ER (XL) 150 MG PO TB24: 150.0000 mg | ORAL_TABLET | ORAL | Status: DC

## 2014-12-17 MED ORDER — LAMOTRIGINE 200 MG PO TABS: 200.0000 mg | ORAL_TABLET | Freq: Every day | ORAL | Status: DC

## 2014-12-17 MED ORDER — QUETIAPINE FUMARATE 50 MG PO TABS: 50.0000 mg | ORAL_TABLET | Freq: Every day | ORAL | Status: DC
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
## 2014-12-22 ENCOUNTER — Ambulatory Visit (INDEPENDENT_AMBULATORY_CARE_PROVIDER_SITE_OTHER): Payer: PPO | Admitting: Licensed Clinical Social Worker

## 2014-12-22 DIAGNOSIS — F4323 Adjustment disorder with mixed anxiety and depressed mood: Secondary | ICD-10-CM | POA: Diagnosis not present

## 2014-12-24 ENCOUNTER — Encounter: Payer: Self-pay | Admitting: Family Medicine

## 2014-12-24 ENCOUNTER — Ambulatory Visit (INDEPENDENT_AMBULATORY_CARE_PROVIDER_SITE_OTHER): Payer: PPO | Admitting: Family Medicine

## 2014-12-24 VITALS — BP 118/70 | HR 69 | Temp 98.0°F | Ht 74.0 in | Wt 175.8 lb

## 2014-12-24 DIAGNOSIS — F4322 Adjustment disorder with anxiety: Secondary | ICD-10-CM | POA: Diagnosis not present

## 2014-12-24 MED ORDER — DIAZEPAM 5 MG PO TABS: ORAL_TABLET | ORAL | Status: DC

## 2014-12-24 NOTE — Patient Instructions (Addendum)
Generalized Anxiety Disorder Generalized anxiety disorder (GAD) is a mental disorder. It interferes with life functions, including relationships, work, and school. GAD is different from normal anxiety, which everyone experiences at some point in their lives in response to specific life events and activities. Normal anxiety actually helps [place] prepare for and get through these life events and activities. Normal anxiety goes away after the event or activity is over.  GAD causes anxiety that is not necessarily related to specific events or activities. It also causes excess anxiety in proportion to specific events or activities. The anxiety associated with GAD is also difficult to control. GAD can vary from mild to severe. People with severe GAD can have intense waves of anxiety with physical symptoms (panic attacks).  SYMPTOMS The anxiety and worry associated with GAD are difficult to control. This anxiety and worry are related to many life events and activities and also occur more days than not for 6 months or longer. People with GAD also have three or more of the following symptoms (one or more in children):  Restlessness.   Fatigue.  Difficulty concentrating.   Irritability.  Muscle tension.  Difficulty sleeping or unsatisfying sleep. DIAGNOSIS GAD is diagnosed through an assessment by your health care provider. Your health care provider will ask you questions aboutyour mood,physical symptoms, and events in your life. Your health care provider may ask you about your medical history and use of alcohol or drugs, including prescription medicines. Your health care provider may also do a physical exam and blood tests. Certain medical conditions and the use of certain substances can cause symptoms similar to those associated with GAD. Your health care provider may refer you to a mental health specialist for further evaluation. TREATMENT The following therapies are usually used to treat GAD:    Medication. Antidepressant medication usually is prescribed for long-term daily control. Antianxiety medicines may be added in severe cases, especially when panic attacks occur.   Talk therapy (psychotherapy). Certain types of talk therapy can be helpful in treating GAD by providing support, education, and guidance. A form of talk therapy called cognitive behavioral therapy can teach you healthy ways to think about and react to daily life events and activities.  Stress managementtechniques. These include yoga, meditation, and exercise and can be very helpful when they are practiced regularly. A mental health specialist can help determine which treatment is best for you. Some people see improvement with one therapy. However, other people require a combination of therapies. Document Released: 10/28/2012 Document Revised: 11/17/2013 Document Reviewed: 10/28/2012 [facility] Patient Information 2015 [place], [place]. This information is not intended to replace advice given to you by your health care provider. Make sure you discuss any questions you have with your health care provider. Nicotine Addiction Nicotine can act as both a stimulant (excites/activates) and a sedative (calms/quiets). Immediately after exposure to nicotine, there is a "kick" caused in part by the drug's stimulation of the adrenal glands and resulting discharge of adrenaline (epinephrine). The rush of adrenaline stimulates the body and causes a sudden release of sugar. This means that smokers are always slightly hyperglycemic. Hyperglycemic means that the blood sugar is high, just like in diabetics. Nicotine also decreases the amount of insulin which helps control sugar levels in the body. There is an increase in blood pressure, breathing, and the rate of heart beats.  In addition, nicotine indirectly causes a release of dopamine in the brain that controls pleasure and motivation. A similar reaction is seen with other drugs of  abuse,  such as cocaine and heroin. This dopamine release is thought to cause the pleasurable sensations when smoking. In some different cases, nicotine can also create a calming effect, depending on sensitivity of the smoker's nervous system and the dose of nicotine taken. WHAT HAPPENS WHEN NICOTINE IS TAKEN FOR LONG PERIODS OF TIME?  Long-term use of nicotine results in addiction. It is difficult to stop.  Repeated use of nicotine creates tolerance. Higher doses of nicotine are needed to get the "kick." When nicotine use is stopped, withdrawal may last a month or more. Withdrawal may begin within a few hours after the last cigarette. Symptoms peak within the first few days and may lessen within a few weeks. For some people, however, symptoms may last for months or longer. Withdrawal symptoms include:   Irritability.  Craving.  Learning and attention deficits.  Sleep disturbances.  Increased appetite. Craving for tobacco may last for 6 months or longer. Many behaviors done while using nicotine can also play a part in the severity of withdrawal symptoms. For some people, the feel, smell, and sight of a cigarette and the ritual of obtaining, handling, lighting, and smoking the cigarette are closely linked with the pleasure of smoking. When stopped, they also miss the related behaviors which make the withdrawal or craving worse. While nicotine gum and patches may lessen the drug aspects of withdrawal, cravings often persist. WHAT ARE THE MEDICAL CONSEQUENCES OF NICOTINE USE?  Nicotine addiction accounts for one-third of all cancers. The top cancer caused by tobacco is lung cancer. Lung cancer is the number one cancer killer of both men and women.  Smoking is also associated with cancers of the:  Mouth.  Pharynx.  Larynx.  Esophagus.  Stomach.  Pancreas.  Cervix.  Kidney.  Ureter.  Bladder.  Smoking also causes lung diseases such as lasting (chronic) bronchitis and emphysema.  It  worsens asthma in adults and children.  Smoking increases the risk of heart disease, including:  Stroke.  Heart attack.  Vascular disease.  Aneurysm.  Passive or secondary smoke can also increase medical risks including:  Asthma in children.  Sudden Infant Death Syndrome (SIDS).  Additionally, dropped cigarettes are the leading cause of residential fire fatalities.  Nicotine poisoning has been reported from accidental ingestion of tobacco products by children and pets. Death usually results in a few minutes from respiratory failure (when a person stops breathing) caused by paralysis. TREATMENT   Medication. Nicotine replacement medicines such as nicotine gum and the patch are used to stop smoking. These medicines gradually lower the dosage of nicotine in the body. These medicines do not contain the carbon monoxide and other toxins found in tobacco smoke.  Hypnotherapy.  Relaxation therapy.  Nicotine Anonymous (a 12-step support program). Find times and locations in your local yellow pages. Document Released: 03/08/2004 Document Revised: 09/25/2011 Document Reviewed: 08/29/2013 [facility] Patient Information 2015 [place], [place]. This information is not intended to replace advice given to you by your health care provider. Make sure you discuss any questions you have with your health care provider.

## 2014-12-24 NOTE — Progress Notes (Signed)
Subjective:    Patient ID: Willie Andrews, male    DOB: 27-Sep-1955, 59 y.o.   MRN: [id number]  HPI  Patient here for refill on valium for anxiety.  Pt is seeing cardiology, and pain management.  No new complaints.   Past Medical History  Diagnosis Date  . Arthritis   . Depression   . High cholesterol   . Kidney stones   . Migraine   . Benign prostatic hypertrophy     Dr. Rosana Hoes  . Hypertension   . Seasonal allergies   . Sleep apnea   . Chronic pain   . History of echocardiogram     a.  Echo 5/16:  EF 55-60%, mild AI, mild MR, mild LAE.    Review of Systems  Constitutional: Negative for diaphoresis, appetite change, fatigue and unexpected weight change.  Eyes: Negative for pain, redness and visual disturbance.  Respiratory: Negative for cough, chest tightness, shortness of breath and wheezing.   Cardiovascular: Negative for chest pain, palpitations and leg swelling.  Endocrine: Negative for cold intolerance, heat intolerance, polydipsia, polyphagia and polyuria.  Genitourinary: Negative for dysuria, frequency and difficulty urinating.  Neurological: Negative for dizziness, light-headedness, numbness and headaches.  Psychiatric/Behavioral: Negative for decreased concentration. The patient is not nervous/anxious.     Current Outpatient Prescriptions on File Prior to Visit  Medication Sig Dispense Refill  . aspirin EC 81 MG tablet Take 1 tablet (81 mg total) by mouth daily.    . benazepril (LOTENSIN) 20 MG tablet Take 1 tablet (20 mg total) by mouth daily. 90 tablet 3  . buPROPion (WELLBUTRIN XL) 150 MG 24 hr tablet Take 1 tablet (150 mg total) by mouth every morning. 30 tablet 0  . clomiPHENE (CLOMID) 50 MG tablet TAKE ONE-FOURTH TABLET BY MOUTH ONCE DAILY 10 tablet 2  . finasteride (PROSCAR) 5 MG tablet Take 5 mg by mouth daily.    Marland Kitchen lamoTRIgine (LAMICTAL) 200 MG tablet Take 1 tablet (200 mg total) by mouth daily. 30 tablet 0  . methadone (DOLOPHINE) 10 MG tablet Takes as  directed    . metoprolol tartrate (LOPRESSOR) 25 MG tablet Take 1 tablet (25 mg total) by mouth 2 (two) times daily. 180 tablet 3  . oxyCODONE-acetaminophen (PERCOCET) 10-325 MG per tablet Take 1 tablet by mouth every 8 (eight) hours as needed. For pain    . pantoprazole (PROTONIX) 40 MG tablet Take 1 tablet (40 mg total) by mouth daily. 30 tablet 3  . pravastatin (PRAVACHOL) 40 MG tablet Take 1 tablet (40 mg total) by mouth every evening. 90 tablet 3  . QUEtiapine (SEROQUEL) 50 MG tablet Take 1 tablet (50 mg total) by mouth at bedtime. 30 tablet 0  . rizatriptan (MAXALT) 10 MG tablet Take 10 mg by mouth as needed. May repeat in 2 hours if needed no more than 2 tablet in 24 hours For migraines    . sildenafil (REVATIO) 20 MG tablet 2-5 tabs as needed for ED symptoms 50 tablet 2  . tamsulosin (FLOMAX) 0.4 MG CAPS capsule Take 0.4 mg by mouth daily.    . [DISCONTINUED] vardenafil (LEVITRA) 20 MG tablet Take 20 mg by mouth as directed.       No current facility-administered medications on file prior to visit.       Objective:    Physical Exam  Constitutional: He is oriented to person, place, and time. Vital signs are normal. He appears well-developed and well-nourished. He is sleeping.  HENT:  Head: Normocephalic and  atraumatic.  Mouth/Throat: Oropharynx is clear and moist.  Eyes: EOM are normal. Pupils are equal, round, and reactive to light.  Neck: Normal range of motion. Neck supple. No thyromegaly present.  Cardiovascular: Normal rate and regular rhythm.   No murmur heard. Pulmonary/Chest: Effort normal and breath sounds normal. No respiratory distress. He has no wheezes. He has no rales. He exhibits no tenderness.  Musculoskeletal: He exhibits no edema or tenderness.  Neurological: He is alert and oriented to person, place, and time.  Skin: Skin is warm and dry.  Psychiatric: He has a normal mood and affect. His behavior is normal. Judgment and thought content normal.    BP 118/70  mmHg  Pulse 69  Temp(Src) 98 F (36.7 C) (Oral)  Ht 6\' 2"  (1.88 m)  Wt 175 lb 12.8 oz (79.742 kg)  BMI 22.56 kg/m2  SpO2 92% Wt Readings from Last 3 Encounters:  12/24/14 175 lb 12.8 oz (79.742 kg)  12/17/14 175 lb (79.379 kg)  12/02/14 174 lb (78.926 kg)     Lab Results  Component Value Date   WBC 7.1 12/02/2014   HGB 12.6* 12/02/2014   HCT 37.3* 12/02/2014   PLT 178.0 12/02/2014   GLUCOSE 129* 12/02/2014   CHOL 128 06/16/2014   TRIG 183.0* 06/16/2014   HDL 34.90* 06/16/2014   LDLDIRECT 93.6 07/22/2013   LDLCALC 57 06/16/2014   ALT 10 12/02/2014   AST 13 12/02/2014   NA 140 12/02/2014   K 4.4 12/02/2014   CL 105 12/02/2014   CREATININE 1.22 12/02/2014   BUN 11 12/02/2014   CO2 32 12/02/2014   TSH 0.99 12/02/2014   PSA 1.78 09/28/2014   HGBA1C 6.2 11/08/2012       Assessment & Plan:   Problem List Items Addressed This Visit    None    Visit Diagnoses    Adjustment disorder with anxious mood    -  Primary    Relevant Medications    diazepam (VALIUM) 5 MG tablet       I am having Mr. Ricard Dillon maintain his rizatriptan, oxyCODONE-acetaminophen, aspirin EC, metoprolol tartrate, pravastatin, methadone, sildenafil, clomiPHENE, finasteride, tamsulosin, benazepril, pantoprazole, lamoTRIgine, buPROPion, QUEtiapine, and diazepam.  Meds ordered this encounter  Medications  . diazepam (VALIUM) 5 MG tablet    Sig: TAKE ONE TABLET BY MOUTH EVERY 6 HOURS AS NEEDED FOR ANXIETY (SPASMS)    Dispense:  60 tablet    Refill:  2     Garnet Koyanagi, DO

## 2014-12-24 NOTE — Progress Notes (Signed)
Pre visit review using our clinic review tool, if applicable. No additional management support is needed unless otherwise documented below in the visit note. 

## 2014-12-29 ENCOUNTER — Ambulatory Visit: Payer: PPO | Admitting: Licensed Clinical Social Worker

## 2015-01-19 ENCOUNTER — Ambulatory Visit (HOSPITAL_COMMUNITY): Payer: Self-pay | Admitting: Psychiatry

## 2015-01-19 ENCOUNTER — Ambulatory Visit: Payer: PPO | Admitting: Licensed Clinical Social Worker

## 2015-01-19 ENCOUNTER — Telehealth: Payer: Self-pay | Admitting: Family Medicine

## 2015-01-19 NOTE — Telephone Encounter (Signed)
pre visit letter mailed 01/19/15 °

## 2015-01-21 ENCOUNTER — Other Ambulatory Visit (HOSPITAL_COMMUNITY): Payer: Self-pay | Admitting: Psychiatry

## 2015-01-21 DIAGNOSIS — F39 Unspecified mood [affective] disorder: Secondary | ICD-10-CM

## 2015-01-22 ENCOUNTER — Encounter (HOSPITAL_COMMUNITY): Payer: Self-pay

## 2015-01-22 NOTE — Telephone Encounter (Signed)
Record opened in error.

## 2015-01-22 NOTE — Telephone Encounter (Signed)
Met with Dr.Tadepalli who authorized a one time refill of patient's prescribed Lamictal and Wellbutrin.  Both one time refills e-scribed into patient's [place] at [facility]. and called patient to inform new orders were sent.

## 2015-01-26 ENCOUNTER — Ambulatory Visit (INDEPENDENT_AMBULATORY_CARE_PROVIDER_SITE_OTHER): Payer: PPO | Admitting: Licensed Clinical Social Worker

## 2015-01-26 DIAGNOSIS — F4323 Adjustment disorder with mixed anxiety and depressed mood: Secondary | ICD-10-CM | POA: Diagnosis not present

## 2015-02-08 ENCOUNTER — Encounter: Payer: Self-pay | Admitting: Family Medicine

## 2015-02-08 ENCOUNTER — Other Ambulatory Visit: Payer: Self-pay | Admitting: Acute Care

## 2015-02-08 ENCOUNTER — Ambulatory Visit (INDEPENDENT_AMBULATORY_CARE_PROVIDER_SITE_OTHER): Payer: PPO | Admitting: Family Medicine

## 2015-02-08 VITALS — BP 110/70 | HR 59 | Temp 98.1°F | Ht 74.0 in | Wt 173.8 lb

## 2015-02-08 DIAGNOSIS — Z87891 Personal history of nicotine dependence: Secondary | ICD-10-CM | POA: Diagnosis not present

## 2015-02-08 DIAGNOSIS — G894 Chronic pain syndrome: Secondary | ICD-10-CM

## 2015-02-08 DIAGNOSIS — Z0000 Encounter for general adult medical examination without abnormal findings: Secondary | ICD-10-CM | POA: Diagnosis not present

## 2015-02-08 DIAGNOSIS — F32A Depression, unspecified: Secondary | ICD-10-CM

## 2015-02-08 DIAGNOSIS — E785 Hyperlipidemia, unspecified: Secondary | ICD-10-CM

## 2015-02-08 DIAGNOSIS — I10 Essential (primary) hypertension: Secondary | ICD-10-CM | POA: Diagnosis not present

## 2015-02-08 DIAGNOSIS — F329 Major depressive disorder, single episode, unspecified: Secondary | ICD-10-CM

## 2015-02-08 NOTE — Progress Notes (Signed)
Pre visit review using our clinic review tool, if applicable. No additional management support is needed unless otherwise documented below in the visit note. 

## 2015-02-08 NOTE — Assessment & Plan Note (Signed)
Check labs con't pravastatin 

## 2015-02-08 NOTE — Progress Notes (Signed)
Patient ID: Willie Andrews, male    DOB: 06/21/1956  Age: 59 y.o. MRN: [id number]    Subjective:  Subjective HPI Willie Andrews presents for cpe and labs.  Pt is having hand surgery in near future.  He has quit smoking about 6 months ago but is still using e cig-- he is slowly weaning off.   Review of Systems  Constitutional: Negative.   HENT: Negative for congestion, ear pain, hearing loss, nosebleeds, postnasal drip, rhinorrhea, sinus pressure, sneezing and tinnitus.   Eyes: Negative for photophobia, discharge, itching and visual disturbance.  Respiratory: Negative.   Cardiovascular: Negative.   Gastrointestinal: Negative for abdominal pain, constipation, blood in stool, abdominal distention and anal bleeding.  Endocrine: Negative.   Genitourinary: Negative.   Musculoskeletal: Positive for myalgias, back pain and arthralgias.  Skin: Negative.   Allergic/Immunologic: Negative.   Neurological: Negative for dizziness, weakness, light-headedness, numbness and headaches.  Psychiatric/Behavioral: Negative for suicidal ideas, confusion, sleep disturbance, dysphoric mood, decreased concentration and agitation. The patient is not nervous/anxious.     History Past Medical History  Diagnosis Date  . Arthritis   . Depression   . High cholesterol   . Kidney stones   . Migraine   . Benign prostatic hypertrophy     Dr. Rosana Hoes  . Hypertension   . Seasonal allergies   . Sleep apnea   . Chronic pain   . History of echocardiogram     a.  Echo 5/16:  EF 55-60%, mild AI, mild MR, mild LAE.    He has past surgical history that includes L5-S1; Elbow surgery; Neck surgery; Shoulder surgery; Back surgery; Nasal sinus surgery; Back surgery (2012); and Spine surgery ([id number]).   His family history includes Alcohol abuse in an other family member; Arthritis in an other family member; Cancer in his mother; Hypertension in his mother. There is no history of Heart attack or Stroke.He reports that he has  quit smoking. He started smoking about 6 weeks ago. He has never used smokeless tobacco. He reports that he drinks alcohol. He reports that he does not use illicit drugs.  Current Outpatient Prescriptions on File Prior to Visit  Medication Sig Dispense Refill  . aspirin EC 81 MG tablet Take 1 tablet (81 mg total) by mouth daily.    . benazepril (LOTENSIN) 20 MG tablet Take 1 tablet (20 mg total) by mouth daily. 90 tablet 3  . buPROPion (WELLBUTRIN XL) 150 MG 24 hr tablet TAKE ONE TABLET BY MOUTH EVERY MORNING **DOSE DECREASE** 30 tablet 0  . clomiPHENE (CLOMID) 50 MG tablet TAKE ONE-FOURTH TABLET BY MOUTH ONCE DAILY 10 tablet 2  . diazepam (VALIUM) 5 MG tablet TAKE ONE TABLET BY MOUTH EVERY 6 HOURS AS NEEDED FOR ANXIETY (SPASMS) 60 tablet 2  . finasteride (PROSCAR) 5 MG tablet Take 5 mg by mouth daily.    Marland Kitchen lamoTRIgine (LAMICTAL) 200 MG tablet TAKE ONE TABLET BY MOUTH ONCE DAILY 30 tablet 0  . methadone (DOLOPHINE) 10 MG tablet Takes as directed    . metoprolol tartrate (LOPRESSOR) 25 MG tablet Take 1 tablet (25 mg total) by mouth 2 (two) times daily. 180 tablet 3  . oxyCODONE-acetaminophen (PERCOCET) 10-325 MG per tablet Take 1 tablet by mouth every 8 (eight) hours as needed. For pain    . pantoprazole (PROTONIX) 40 MG tablet Take 1 tablet (40 mg total) by mouth daily. 30 tablet 3  . pravastatin (PRAVACHOL) 40 MG tablet Take 1 tablet (40 mg total) by mouth  every evening. 90 tablet 3  . QUEtiapine (SEROQUEL) 50 MG tablet Take 1 tablet (50 mg total) by mouth at bedtime. 30 tablet 0  . rizatriptan (MAXALT) 10 MG tablet Take 10 mg by mouth as needed. May repeat in 2 hours if needed no more than 2 tablet in 24 hours For migraines    . sildenafil (REVATIO) 20 MG tablet 2-5 tabs as needed for ED symptoms 50 tablet 2  . tamsulosin (FLOMAX) 0.4 MG CAPS capsule Take 0.4 mg by mouth daily.    . [DISCONTINUED] vardenafil (LEVITRA) 20 MG tablet Take 20 mg by mouth as directed.       No current  facility-administered medications on file prior to visit.     Objective:  Objective Physical Exam  Constitutional: He is oriented to person, place, and time. He appears well-developed and well-nourished. No distress.  HENT:  Head: Normocephalic and atraumatic.  Right Ear: External ear normal.  Left Ear: External ear normal.  Nose: Nose normal.  Mouth/Throat: Oropharynx is clear and moist. No oropharyngeal exudate.  Eyes: Conjunctivae and EOM are normal. Pupils are equal, round, and reactive to light. Right eye exhibits no discharge. Left eye exhibits no discharge.  Neck: Normal range of motion. Neck supple. No JVD present. No thyromegaly present.  Cardiovascular: Normal rate, regular rhythm and intact distal pulses.  Exam reveals no gallop and no friction rub.   No murmur heard. Pulmonary/Chest: Effort normal and breath sounds normal. No respiratory distress. He has no wheezes. He has no rales. He exhibits no tenderness.  Abdominal: Soft. Bowel sounds are normal. He exhibits no distension and no mass. There is no tenderness. There is no rebound and no guarding.  Genitourinary:  Per urology  Musculoskeletal: Normal range of motion. He exhibits no edema or tenderness.  Lymphadenopathy:    He has no cervical adenopathy.  Neurological: He is alert and oriented to person, place, and time. He displays normal reflexes. He exhibits normal muscle tone.  Skin: Skin is warm and dry. No rash noted. He is not diaphoretic. No erythema. No pallor.  Psychiatric: He has a normal mood and affect. His behavior is normal. Judgment and thought content normal.   BP 110/70 mmHg  Pulse 59  Temp(Src) 98.1 F (36.7 C) (Oral)  Ht 6\' 2"  (1.88 m)  Wt 173 lb 12.8 oz (78.835 kg)  BMI 22.31 kg/m2  SpO2 98% Wt Readings from Last 3 Encounters:  02/08/15 173 lb 12.8 oz (78.835 kg)  12/24/14 175 lb 12.8 oz (79.742 kg)  12/17/14 175 lb (79.379 kg)     Lab Results  Component Value Date   WBC 7.1 12/02/2014    HGB 12.6* 12/02/2014   HCT 37.3* 12/02/2014   PLT 178.0 12/02/2014   GLUCOSE 129* 12/02/2014   CHOL 128 06/16/2014   TRIG 183.0* 06/16/2014   HDL 34.90* 06/16/2014   LDLDIRECT 93.6 07/22/2013   LDLCALC 57 06/16/2014   ALT 10 12/02/2014   AST 13 12/02/2014   NA 140 12/02/2014   K 4.4 12/02/2014   CL 105 12/02/2014   CREATININE 1.22 12/02/2014   BUN 11 12/02/2014   CO2 32 12/02/2014   TSH 0.99 12/02/2014   PSA 1.78 09/28/2014   HGBA1C 6.2 11/08/2012    Dg Chest 2 View  12/03/2014   CLINICAL DATA:  Weight loss, history of hypertension, long-term tobacco use  EXAM: CHEST  2 VIEW  COMPARISON:  PA and lateral chest x-ray of April 15, 2012  FINDINGS: The lungs are  adequately inflated. There is no focal infiltrate. No pulmonary parenchymal nodules or masses are demonstrated. The heart and pulmonary vascularity are normal. The mediastinum is normal in width. There is no pleural effusion. There is mild degenerative disc disease of the lower thoracic spine. A nerve stimulator electrode lies at the T8 through T10 levels of the spinal canal.  IMPRESSION: There is no active cardiopulmonary disease. There are no findings demonstrated to explain the patient's weight loss.   Electronically Signed   By: David  Martinique M.D.   On: 12/03/2014 16:27     Assessment & Plan:  Plan I am having Mr. Ricard Dillon maintain his rizatriptan, oxyCODONE-acetaminophen, aspirin EC, metoprolol tartrate, pravastatin, methadone, sildenafil, clomiPHENE, finasteride, tamsulosin, benazepril, pantoprazole, QUEtiapine, diazepam, buPROPion, and lamoTRIgine.  No orders of the defined types were placed in this encounter.    Problem List Items Addressed This Visit      Unprioritized   Hyperlipidemia    Check labs con't pravastatin      Essential hypertension   Relevant Orders   Basic metabolic panel   CBC with Differential/Platelet   Hepatic function panel   Lipid panel   POCT urinalysis dipstick   Microalbumin /  creatinine urine ratio    Other Visit Diagnoses    Preventative health care    -  Primary    Relevant Orders    Ambulatory referral to Gastroenterology    Depression        Relevant Orders    Basic metabolic panel    CBC with Differential/Platelet    Hepatic function panel    Lipid panel    POCT urinalysis dipstick    Microalbumin / creatinine urine ratio    Former heavy cigarette smoker (20-39 per day)        Relevant Orders    Ambulatory Referral for Lung Cancer Scre    Hepatic function panel    Lipid panel    POCT urinalysis dipstick    Microalbumin / creatinine urine ratio       Follow-up: Return in about 1 year (around 02/08/2016) for annual exam, fasting.  Garnet Koyanagi, DO

## 2015-02-08 NOTE — Patient Instructions (Signed)
Preventive Care for Adults A healthy lifestyle and preventive care can promote health and wellness. Preventive health guidelines for men include the following key practices:  A routine yearly physical is a good way to check with your health care provider about your health and preventative screening. It is a chance to share any concerns and updates on your health and to receive a thorough exam.  Visit your dentist for a routine exam and preventative care every 6 months. Brush your teeth twice a day and floss once a day. Good oral hygiene prevents tooth decay and gum disease.  The frequency of eye exams is based on your age, health, family medical history, use of contact lenses, and other factors. Follow your health care provider's recommendations for frequency of eye exams.  Eat a healthy diet. Foods such as vegetables, fruits, whole grains, low-fat dairy products, and lean protein foods contain the nutrients you need without too many calories. Decrease your intake of foods high in solid fats, added sugars, and salt. Eat the right amount of calories for you.Get information about a proper diet from your health care provider, if necessary.  Regular physical exercise is one of the most important things you can do for your health. Most adults should get at least 150 minutes of moderate-intensity exercise (any activity that increases your heart rate and causes you to sweat) each week. In addition, most adults need muscle-strengthening exercises on 2 or more days a week.  Maintain a healthy weight. The body mass index (BMI) is a screening tool to identify possible weight problems. It provides an estimate of body fat based on height and weight. Your health care provider can find your BMI and can help you achieve or maintain a healthy weight.For adults 20 years and older:  A BMI below 18.5 is considered underweight.  A BMI of 18.5 to 24.9 is normal.  A BMI of 25 to 29.9 is considered overweight.  A BMI  of 30 and above is considered obese.  Maintain normal blood lipids and cholesterol levels by exercising and minimizing your intake of saturated fat. Eat a balanced diet with plenty of fruit and vegetables. Blood tests for lipids and cholesterol should begin at age 50 and be repeated every 5 years. If your lipid or cholesterol levels are high, you are over 50, or you are at high risk for heart disease, you may need your cholesterol levels checked more frequently.Ongoing high lipid and cholesterol levels should be treated with medicines if diet and exercise are not working.  If you smoke, find out from your health care provider how to quit. If you do not use tobacco, do not start.  Lung cancer screening is recommended for adults aged 73-80 years who are at high risk for developing lung cancer because of a history of smoking. A yearly low-dose CT scan of the lungs is recommended for people who have at least a 30-pack-year history of smoking and are a current smoker or have quit within the past 15 years. A pack year of smoking is smoking an average of 1 pack of cigarettes a day for 1 year (for example: 1 pack a day for 30 years or 2 packs a day for 15 years). Yearly screening should continue until the smoker has stopped smoking for at least 15 years. Yearly screening should be stopped for people who develop a health problem that would prevent them from having lung cancer treatment.  If you choose to drink alcohol, do not have more than  2 drinks per day. One drink is considered to be 12 ounces (355 mL) of beer, 5 ounces (148 mL) of wine, or 1.5 ounces (44 mL) of liquor.  Avoid use of street drugs. Do not share needles with anyone. Ask for help if you need support or instructions about stopping the use of drugs.  High blood pressure causes heart disease and increases the risk of stroke. Your blood pressure should be checked at least every 1-2 years. Ongoing high blood pressure should be treated with  medicines, if weight loss and exercise are not effective.  If you are 45-79 years old, ask your health care provider if you should take aspirin to prevent heart disease.  Diabetes screening involves taking a blood sample to check your fasting blood sugar level. This should be done once every 3 years, after age 45, if you are within normal weight and without risk factors for diabetes. Testing should be considered at a younger age or be carried out more frequently if you are overweight and have at least 1 risk factor for diabetes.  Colorectal cancer can be detected and often prevented. Most routine colorectal cancer screening begins at the age of 50 and continues through age 75. However, your health care provider may recommend screening at an earlier age if you have risk factors for colon cancer. On a yearly basis, your health care provider may provide home test kits to check for hidden blood in the stool. Use of a small camera at the end of a tube to directly examine the colon (sigmoidoscopy or colonoscopy) can detect the earliest forms of colorectal cancer. Talk to your health care provider about this at age 50, when routine screening begins. Direct exam of the colon should be repeated every 5-10 years through age 75, unless early forms of precancerous polyps or small growths are found.  People who are at an increased risk for hepatitis B should be screened for this virus. You are considered at high risk for hepatitis B if:  You were born in a country where hepatitis B occurs often. Talk with your health care provider about which countries are considered high risk.  Your parents were born in a high-risk country and you have not received a shot to protect against hepatitis B (hepatitis B vaccine).  You have HIV or AIDS.  You use needles to inject street drugs.  You live with, or have sex with, someone who has hepatitis B.  You are a man who has sex with other men (MSM).  You get hemodialysis  treatment.  You take certain medicines for conditions such as cancer, organ transplantation, and autoimmune conditions.  Hepatitis C blood testing is recommended for all people born from 1945 through 1965 and any individual with known risks for hepatitis C.  Practice safe sex. Use condoms and avoid high-risk sexual practices to reduce the spread of sexually transmitted infections (STIs). STIs include gonorrhea, chlamydia, syphilis, trichomonas, herpes, HPV, and human immunodeficiency virus (HIV). Herpes, HIV, and HPV are viral illnesses that have no cure. They can result in disability, cancer, and death.  If you are at risk of being infected with HIV, it is recommended that you take a prescription medicine daily to prevent HIV infection. This is called preexposure prophylaxis (PrEP). You are considered at risk if:  You are a man who has sex with other men (MSM) and have other risk factors.  You are a heterosexual man, are sexually active, and are at increased risk for HIV infection.    You take drugs by injection.  You are sexually active with a partner who has HIV.  Talk with your health care provider about whether you are at high risk of being infected with HIV. If you choose to begin PrEP, you should first be tested for HIV. You should then be tested every 3 months for as long as you are taking PrEP.  A one-time screening for abdominal aortic aneurysm (AAA) and surgical repair of large AAAs by ultrasound are recommended for men ages 32 to 67 years who are current or former smokers.  Healthy men should no longer receive prostate-specific antigen (PSA) blood tests as part of routine cancer screening. Talk with your health care provider about prostate cancer screening.  Testicular cancer screening is not recommended for adult males who have no symptoms. Screening includes self-exam, a health care provider exam, and other screening tests. Consult with your health care provider about any symptoms  you have or any concerns you have about testicular cancer.  Use sunscreen. Apply sunscreen liberally and repeatedly throughout the day. You should seek shade when your shadow is shorter than you. Protect yourself by wearing long sleeves, pants, a wide-brimmed hat, and sunglasses year round, whenever you are outdoors.  Once a month, do a whole-body skin exam, using a mirror to look at the skin on your back. Tell your health care provider about new moles, moles that have irregular borders, moles that are larger than a pencil eraser, or moles that have changed in shape or color.  Stay current with required vaccines (immunizations).  Influenza vaccine. All adults should be immunized every year.  Tetanus, diphtheria, and acellular pertussis (Td, Tdap) vaccine. An adult who has not previously received Tdap or who does not know his vaccine status should receive 1 dose of Tdap. This initial dose should be followed by tetanus and diphtheria toxoids (Td) booster doses every 10 years. Adults with an unknown or incomplete history of completing a 3-dose immunization series with Td-containing vaccines should begin or complete a primary immunization series including a Tdap dose. Adults should receive a Td booster every 10 years.  Varicella vaccine. An adult without evidence of immunity to varicella should receive 2 doses or a second dose if he has previously received 1 dose.  Human papillomavirus (HPV) vaccine. Males aged 68-21 years who have not received the vaccine previously should receive the 3-dose series. Males aged 22-26 years may be immunized. Immunization is recommended through the age of 6 years for any male who has sex with males and did not get any or all doses earlier. Immunization is recommended for any person with an immunocompromised condition through the age of 49 years if he did not get any or all doses earlier. During the 3-dose series, the second dose should be obtained 4-8 weeks after the first  dose. The third dose should be obtained 24 weeks after the first dose and 16 weeks after the second dose.  Zoster vaccine. One dose is recommended for adults aged 50 years or older unless certain conditions are present.  Measles, mumps, and rubella (MMR) vaccine. Adults born before [birth year] generally are considered immune to measles and mumps. Adults born in [birth year] or later should have 1 or more doses of MMR vaccine unless there is a contraindication to the vaccine or there is laboratory evidence of immunity to each of the three diseases. A routine second dose of MMR vaccine should be obtained at least 28 days after the first dose for students attending postsecondary  schools, health care workers, or international travelers. People who received inactivated measles vaccine or an unknown type of measles vaccine during 1963-1967 should receive 2 doses of MMR vaccine. People who received inactivated mumps vaccine or an unknown type of mumps vaccine before 1979 and are at high risk for mumps infection should consider immunization with 2 doses of MMR vaccine. Unvaccinated health care workers born before 1957 who lack laboratory evidence of measles, mumps, or rubella immunity or laboratory confirmation of disease should consider measles and mumps immunization with 2 doses of MMR vaccine or rubella immunization with 1 dose of MMR vaccine.  Pneumococcal 13-valent conjugate (PCV13) vaccine. When indicated, a person who is uncertain of his immunization history and has no record of immunization should receive the PCV13 vaccine. An adult aged 19 years or older who has certain medical conditions and has not been previously immunized should receive 1 dose of PCV13 vaccine. This PCV13 should be followed with a dose of pneumococcal polysaccharide (PPSV23) vaccine. The PPSV23 vaccine dose should be obtained at least 8 weeks after the dose of PCV13 vaccine. An adult aged 19 years or older who has certain medical conditions and  previously received 1 or more doses of PPSV23 vaccine should receive 1 dose of PCV13. The PCV13 vaccine dose should be obtained 1 or more years after the last PPSV23 vaccine dose.  Pneumococcal polysaccharide (PPSV23) vaccine. When PCV13 is also indicated, PCV13 should be obtained first. All adults aged 65 years and older should be immunized. An adult younger than age 65 years who has certain medical conditions should be immunized. Any person who resides in a nursing home or long-term care facility should be immunized. An adult smoker should be immunized. People with an immunocompromised condition and certain other conditions should receive both PCV13 and PPSV23 vaccines. People with human immunodeficiency virus (HIV) infection should be immunized as soon as possible after diagnosis. Immunization during chemotherapy or radiation therapy should be avoided. Routine use of PPSV23 vaccine is not recommended for American Indians, [place] Natives, or people younger than 65 years unless there are medical conditions that require PPSV23 vaccine. When indicated, people who have unknown immunization and have no record of immunization should receive PPSV23 vaccine. One-time revaccination 5 years after the first dose of PPSV23 is recommended for people aged 19-64 years who have chronic kidney failure, nephrotic syndrome, asplenia, or immunocompromised conditions. People who received 1-2 doses of PPSV23 before age 65 years should receive another dose of PPSV23 vaccine at age 65 years or later if at least 5 years have passed since the previous dose. Doses of PPSV23 are not needed for people immunized with PPSV23 at or after age 65 years.  Meningococcal vaccine. Adults with asplenia or persistent complement component deficiencies should receive 2 doses of quadrivalent meningococcal conjugate (MenACWY-D) vaccine. The doses should be obtained at least 2 months apart. Microbiologists working with certain meningococcal bacteria,  military recruits, people at risk during an outbreak, and people who travel to or live in countries with a high rate of meningitis should be immunized. A first-year college student up through age 21 years who is living in a residence hall should receive a dose if he did not receive a dose on or after his 16th birthday. Adults who have certain high-risk conditions should receive one or more doses of vaccine.  Hepatitis A vaccine. Adults who wish to be protected from this disease, have certain high-risk conditions, work with hepatitis A-infected animals, work in hepatitis A research labs, or   travel to or work in countries with a high rate of hepatitis A should be immunized. Adults who were previously unvaccinated and who anticipate close contact with an international adoptee during the first 60 days after arrival in the [place] from a country with a high rate of hepatitis A should be immunized.  Hepatitis B vaccine. Adults should be immunized if they wish to be protected from this disease, have certain high-risk conditions, may be exposed to blood or other infectious body fluids, are household contacts or sex partners of hepatitis B positive people, are clients or workers in certain care facilities, or travel to or work in countries with a high rate of hepatitis B.  Haemophilus influenzae type b (Hib) vaccine. A previously unvaccinated person with asplenia or sickle cell disease or having a scheduled splenectomy should receive 1 dose of Hib vaccine. Regardless of previous immunization, a recipient of a hematopoietic stem cell transplant should receive a 3-dose series 6-12 months after his successful transplant. Hib vaccine is not recommended for adults with HIV infection. Preventive Service / Frequency Ages 52 to 17  Blood pressure check.** / Every 1 to 2 years.  Lipid and cholesterol check.** / Every 5 years beginning at age 69.  Hepatitis C blood test.** / For any individual with known risks for  hepatitis C.  Skin self-exam. / Monthly.  Influenza vaccine. / Every year.  Tetanus, diphtheria, and acellular pertussis (Tdap, Td) vaccine.** / Consult your health care provider. 1 dose of Td every 10 years.  Varicella vaccine.** / Consult your health care provider.  HPV vaccine. / 3 doses over 6 months, if 72 or younger.  Measles, mumps, rubella (MMR) vaccine.** / You need at least 1 dose of MMR if you were born in 1957 or later. You may also need a second dose.  Pneumococcal 13-valent conjugate (PCV13) vaccine.** / Consult your health care provider.  Pneumococcal polysaccharide (PPSV23) vaccine.** / 1 to 2 doses if you smoke cigarettes or if you have certain conditions.  Meningococcal vaccine.** / 1 dose if you are age 35 to 60 years and a Market researcher living in a residence hall, or have one of several medical conditions. You may also need additional booster doses.  Hepatitis A vaccine.** / Consult your health care provider.  Hepatitis B vaccine.** / Consult your health care provider.  Haemophilus influenzae type b (Hib) vaccine.** / Consult your health care provider. Ages 35 to 8  Blood pressure check.** / Every 1 to 2 years.  Lipid and cholesterol check.** / Every 5 years beginning at age 57.  Lung cancer screening. / Every year if you are aged 44-80 years and have a 30-pack-year history of smoking and currently smoke or have quit within the past 15 years. Yearly screening is stopped once you have quit smoking for at least 15 years or develop a health problem that would prevent you from having lung cancer treatment.  Fecal occult blood test (FOBT) of stool. / Every year beginning at age 55 and continuing until age 73. You may not have to do this test if you get a colonoscopy every 10 years.  Flexible sigmoidoscopy** or colonoscopy.** / Every 5 years for a flexible sigmoidoscopy or every 10 years for a colonoscopy beginning at age 28 and continuing until age  1.  Hepatitis C blood test.** / For all people born from [birth year] through 1965 and any individual with known risks for hepatitis C.  Skin self-exam. / Monthly.  Influenza vaccine. / Every  year.  Tetanus, diphtheria, and acellular pertussis (Tdap/Td) vaccine.** / Consult your health care provider. 1 dose of Td every 10 years.  Varicella vaccine.** / Consult your health care provider.  Zoster vaccine.** / 1 dose for adults aged 53 years or older.  Measles, mumps, rubella (MMR) vaccine.** / You need at least 1 dose of MMR if you were born in 1957 or later. You may also need a second dose.  Pneumococcal 13-valent conjugate (PCV13) vaccine.** / Consult your health care provider.  Pneumococcal polysaccharide (PPSV23) vaccine.** / 1 to 2 doses if you smoke cigarettes or if you have certain conditions.  Meningococcal vaccine.** / Consult your health care provider.  Hepatitis A vaccine.** / Consult your health care provider.  Hepatitis B vaccine.** / Consult your health care provider.  Haemophilus influenzae type b (Hib) vaccine.** / Consult your health care provider. Ages 77 and over  Blood pressure check.** / Every 1 to 2 years.  Lipid and cholesterol check.**/ Every 5 years beginning at age 85.  Lung cancer screening. / Every year if you are aged 55-80 years and have a 30-pack-year history of smoking and currently smoke or have quit within the past 15 years. Yearly screening is stopped once you have quit smoking for at least 15 years or develop a health problem that would prevent you from having lung cancer treatment.  Fecal occult blood test (FOBT) of stool. / Every year beginning at age 33 and continuing until age 11. You may not have to do this test if you get a colonoscopy every 10 years.  Flexible sigmoidoscopy** or colonoscopy.** / Every 5 years for a flexible sigmoidoscopy or every 10 years for a colonoscopy beginning at age 28 and continuing until age 73.  Hepatitis C blood  test.** / For all people born from [birth year] through 1965 and any individual with known risks for hepatitis C.  Abdominal aortic aneurysm (AAA) screening.** / A one-time screening for ages 50 to 27 years who are current or former smokers.  Skin self-exam. / Monthly.  Influenza vaccine. / Every year.  Tetanus, diphtheria, and acellular pertussis (Tdap/Td) vaccine.** / 1 dose of Td every 10 years.  Varicella vaccine.** / Consult your health care provider.  Zoster vaccine.** / 1 dose for adults aged 34 years or older.  Pneumococcal 13-valent conjugate (PCV13) vaccine.** / Consult your health care provider.  Pneumococcal polysaccharide (PPSV23) vaccine.** / 1 dose for all adults aged 63 years and older.  Meningococcal vaccine.** / Consult your health care provider.  Hepatitis A vaccine.** / Consult your health care provider.  Hepatitis B vaccine.** / Consult your health care provider.  Haemophilus influenzae type b (Hib) vaccine.** / Consult your health care provider. **Family history and personal history of risk and conditions may change your health care provider's recommendations. Document Released: 08/29/2001 Document Revised: 07/08/2013 Document Reviewed: 11/28/2010 [facility] Patient Information 2015 [place], [place]. This information is not intended to replace advice given to you by your health care provider. Make sure you discuss any questions you have with your health care provider.

## 2015-02-08 NOTE — Assessment & Plan Note (Signed)
Per pain management.  

## 2015-02-09 ENCOUNTER — Telehealth: Payer: Self-pay | Admitting: Acute Care

## 2015-02-09 NOTE — Telephone Encounter (Signed)
I called Mr. Willie Andrews to schedule his Lung Cancer Screening. He was referred by Garnet Koyanagi, MD to be screened.  He is scheduled to come in for his Shared Decision counseling appointment 02/11/15 @ 3 pm, and will have his LDCT at 4 pm at [facility] on the same day. He verbalized understanding of time and location of both appointments.He has my contact information in the event he has any further questions.

## 2015-02-11 ENCOUNTER — Ambulatory Visit (INDEPENDENT_AMBULATORY_CARE_PROVIDER_SITE_OTHER)
Admission: RE | Admit: 2015-02-11 | Discharge: 2015-02-11 | Disposition: A | Discharge status: Home / Self Care | Payer: PPO | Source: Ambulatory Visit | Attending: Acute Care | Admitting: Acute Care

## 2015-02-11 ENCOUNTER — Ambulatory Visit (INDEPENDENT_AMBULATORY_CARE_PROVIDER_SITE_OTHER): Payer: PPO | Admitting: Acute Care

## 2015-02-11 ENCOUNTER — Encounter: Payer: Self-pay | Admitting: Acute Care

## 2015-02-11 ENCOUNTER — Other Ambulatory Visit: Payer: Self-pay | Admitting: Endocrinology

## 2015-02-11 ENCOUNTER — Other Ambulatory Visit (HOSPITAL_COMMUNITY): Payer: Self-pay | Admitting: Psychiatry

## 2015-02-11 DIAGNOSIS — Z122 Encounter for screening for malignant neoplasm of respiratory organs: Secondary | ICD-10-CM | POA: Diagnosis not present

## 2015-02-11 DIAGNOSIS — Z87891 Personal history of nicotine dependence: Secondary | ICD-10-CM

## 2015-02-11 DIAGNOSIS — F17210 Nicotine dependence, cigarettes, uncomplicated: Secondary | ICD-10-CM | POA: Diagnosis not present

## 2015-02-11 NOTE — Progress Notes (Signed)
Shared Decision Making Visit Lung Cancer Screening Program [phone])   Eligibility:  Age 59 y.o.  Pack Years Smoking History Calculation 64.5 pack years (# packs/per year x # years smoked)  Recent History of coughing up blood  no  Unexplained weight loss? no ( >Than 15 pounds within the last 6 months )  Prior History Lung / other cancer no (Diagnosis within the last 5 years already requiring surveillance chest CT Scans).  Smoking Status Former Smoker  Former Smokers: Years since quit: < 1 year  Quit Date: 12/29/14  Visit Components:  Discussion included one or more decision making aids. yes  Discussion included risk/benefits of screening. yes  Discussion included potential follow up diagnostic testing for abnormal scans. yes  Discussion included meaning and risk of over diagnosis. yes  Discussion included meaning and risk of False Positives. yes  Discussion included meaning of total radiation exposure. yes  Counseling Included:  Importance of adherence to annual lung cancer LDCT screening. yes  Impact of comorbidities on ability to participate in the program. yes  Ability and willingness to under diagnostic treatment. yes  Smoking Cessation Counseling:  Current Smokers:   Discussed importance of smoking cessation.NA, former smoker  Information about tobacco cessation classes and interventions provided to patient. yes  Patient provided with "ticket" for LDCT Scan. yes  Symptomatic Patient. no  Counseling:NA; former smoker  Diagnosis Code: Tobacco Use Z72.0  Asymptomatic Patient ;NA  Counseling NA  Former Smokers:   Discussed the importance of maintaining cigarette abstinence. yes  Diagnosis Code: Personal History of Nicotine Dependence. N00.370  Information about tobacco cessation classes and interventions provided to patient. Yes  Patient provided with "ticket" for LDCT Scan. yes  Written Order for Lung Cancer Screening with LDCT placed in Epic.  Yes (CT Chest Lung Cancer Screening Low Dose W/O CM) [id number] Z12.2-Screening of respiratory organs Z87.891-Personal history of nicotine dependence  I spent 15 minutes of face to face time with Mr. Aguas discussing the risks and benefits of lung cancer screening.We viewed a power point together that reviewed the risks and benefits of the program as noted above. We took the time to stop and pause during the slides to allow time for questions to be asked and answered, and to allow for discussion as needed for understanding. We discussed the fact that quitting smoking was the single most powerful thing he could do to decrease his risk of lung cancer, and now remaining smoke free was  Important.I encouraged him to call me if he felt the need to start smoking again, and that I will help him in any way that I can, with medication , nicotine replacement therapy,and support groups available in the community. He verbalized understanding. I gave him a copy of the power point we viewed together to refer to in the event that he had any further questions in the future. I also gave him my contact information and told him to call with any questions or concerns.He verbalized understanding, and had no further questions.   Magdalen Spatz, NP

## 2015-02-12 ENCOUNTER — Telehealth: Payer: Self-pay | Admitting: Acute Care

## 2015-02-12 NOTE — Telephone Encounter (Signed)
One time refill of patient's Seroquel authorized as patient was last seen on 12/17/14 and was to return on 01/19/15 but had to be rescheduled to August appointment so order approved by provider to bridge until evaluated.

## 2015-02-12 NOTE — Telephone Encounter (Signed)
I called and spoke with Willie Andrews about his LDCT scan results. I told him that his scan resulted in a lung RADS 2, Nodules with a very low likelihood of becoming a clinically active cancer due to size or lack of growth. I explained that the recommendation is for a repeat scan in 12 months. I told him that I would call him in 11 months and we will schedule his repeat scan for July of 2017. I told him if he had any changes in his health, such as unexplained weight loss, or coughing up blood, that he needs to contact either Dr. Etter Sjogren, or myself, so that he can be seen earlier to work it up. He verbalized understanding, and had no further questions. He has my contact information in the event he has any further questions or concerns.

## 2015-02-16 ENCOUNTER — Ambulatory Visit (HOSPITAL_COMMUNITY): Payer: Self-pay | Admitting: Psychiatry

## 2015-02-16 ENCOUNTER — Ambulatory Visit (INDEPENDENT_AMBULATORY_CARE_PROVIDER_SITE_OTHER): Payer: PPO | Admitting: Licensed Clinical Social Worker

## 2015-02-16 DIAGNOSIS — F4323 Adjustment disorder with mixed anxiety and depressed mood: Secondary | ICD-10-CM | POA: Diagnosis not present

## 2015-02-24 ENCOUNTER — Encounter (HOSPITAL_COMMUNITY): Payer: Self-pay | Admitting: Psychiatry

## 2015-02-24 ENCOUNTER — Ambulatory Visit (INDEPENDENT_AMBULATORY_CARE_PROVIDER_SITE_OTHER): Payer: PPO | Admitting: Psychiatry

## 2015-02-24 VITALS — BP 112/70 | HR 60 | Ht 74.0 in | Wt 182.8 lb

## 2015-02-24 DIAGNOSIS — F319 Bipolar disorder, unspecified: Secondary | ICD-10-CM | POA: Diagnosis not present

## 2015-02-24 DIAGNOSIS — F39 Unspecified mood [affective] disorder: Secondary | ICD-10-CM

## 2015-02-24 MED ORDER — BUPROPION HCL ER (XL) 150 MG PO TB24: ORAL_TABLET | ORAL | Status: DC

## 2015-02-24 MED ORDER — QUETIAPINE FUMARATE 50 MG PO TABS: 50.0000 mg | ORAL_TABLET | Freq: Every day | ORAL | Status: DC

## 2015-02-24 MED ORDER — LAMOTRIGINE 200 MG PO TABS: 200.0000 mg | ORAL_TABLET | Freq: Every day | ORAL | Status: DC
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
## 2015-03-09 ENCOUNTER — Ambulatory Visit: Payer: PPO | Admitting: Licensed Clinical Social Worker

## 2015-03-16 ENCOUNTER — Other Ambulatory Visit: Payer: PPO

## 2015-03-16 ENCOUNTER — Ambulatory Visit (INDEPENDENT_AMBULATORY_CARE_PROVIDER_SITE_OTHER): Payer: PPO | Admitting: Licensed Clinical Social Worker

## 2015-03-16 DIAGNOSIS — F4323 Adjustment disorder with mixed anxiety and depressed mood: Secondary | ICD-10-CM | POA: Diagnosis not present

## 2015-03-25 ENCOUNTER — Other Ambulatory Visit: Payer: Self-pay | Admitting: Family Medicine

## 2015-03-29 ENCOUNTER — Other Ambulatory Visit: Payer: Self-pay | Admitting: Endocrinology

## 2015-04-05 ENCOUNTER — Telehealth: Payer: Self-pay | Admitting: Family Medicine

## 2015-04-05 DIAGNOSIS — F4322 Adjustment disorder with anxiety: Secondary | ICD-10-CM

## 2015-04-05 NOTE — Telephone Encounter (Signed)
Last seen 02/08/15 and filled 12/24/14 #60 with 2 refills    Please advise    KP

## 2015-04-05 NOTE — Telephone Encounter (Signed)
Refill x1 

## 2015-04-05 NOTE — Telephone Encounter (Signed)
Relation to IR:WERX Call back [place]: [place], [place] - [address] [phone] (Phone) [phone] (Fax)         Reason for call:  Patient requesting a refill diazepam (VALIUM) 5 MG tablet

## 2015-04-06 ENCOUNTER — Ambulatory Visit (INDEPENDENT_AMBULATORY_CARE_PROVIDER_SITE_OTHER): Payer: PPO | Admitting: Licensed Clinical Social Worker

## 2015-04-06 DIAGNOSIS — F4323 Adjustment disorder with mixed anxiety and depressed mood: Secondary | ICD-10-CM | POA: Diagnosis not present

## 2015-04-06 MED ORDER — DIAZEPAM 5 MG PO TABS: ORAL_TABLET | ORAL | Status: DC

## 2015-04-06 NOTE — Telephone Encounter (Signed)
Rx faxed.    KP 

## 2015-04-28 ENCOUNTER — Telehealth: Payer: Self-pay | Admitting: Family Medicine

## 2015-04-28 NOTE — Telephone Encounter (Signed)
Relation to MI:WOEH Call back number: [phone]    Reason for call:  Patient would like have labs drawn in [place] and would like to speak with Maudie Mercury directly

## 2015-04-29 NOTE — Telephone Encounter (Signed)
Patient wanted to let me know that he forgot to get his labs and said he will have them done this week.     KP

## 2015-05-04 ENCOUNTER — Ambulatory Visit (INDEPENDENT_AMBULATORY_CARE_PROVIDER_SITE_OTHER): Payer: PPO | Admitting: Licensed Clinical Social Worker

## 2015-05-04 DIAGNOSIS — F4323 Adjustment disorder with mixed anxiety and depressed mood: Secondary | ICD-10-CM | POA: Diagnosis not present

## 2015-05-06 ENCOUNTER — Other Ambulatory Visit: Payer: Self-pay | Admitting: Family Medicine

## 2015-05-07 NOTE — Telephone Encounter (Signed)
Last seen 02/08/15 and filled 04/06/15 #60   Please advise     KP

## 2015-05-20 ENCOUNTER — Other Ambulatory Visit (INDEPENDENT_AMBULATORY_CARE_PROVIDER_SITE_OTHER): Payer: PPO

## 2015-05-20 DIAGNOSIS — F329 Major depressive disorder, single episode, unspecified: Secondary | ICD-10-CM

## 2015-05-20 DIAGNOSIS — I10 Essential (primary) hypertension: Secondary | ICD-10-CM | POA: Diagnosis not present

## 2015-05-20 DIAGNOSIS — F32A Depression, unspecified: Secondary | ICD-10-CM

## 2015-05-20 DIAGNOSIS — Z87891 Personal history of nicotine dependence: Secondary | ICD-10-CM | POA: Diagnosis not present

## 2015-05-20 LAB — CBC WITH DIFFERENTIAL/PLATELET
BASOS PCT: 0.3 % (ref 0.0–3.0)
Basophils Absolute: 0 10*3/uL (ref 0.0–0.1)
EOS ABS: 0.3 10*3/uL (ref 0.0–0.7)
Eosinophils Relative: 2.5 % (ref 0.0–5.0)
HEMATOCRIT: 42.3 % (ref 39.0–52.0)
HEMOGLOBIN: 14.1 g/dL (ref 13.0–17.0)
LYMPHS PCT: 25.1 % (ref 12.0–46.0)
Lymphs Abs: 3.1 10*3/uL (ref 0.7–4.0)
MCHC: 33.4 g/dL (ref 30.0–36.0)
MCV: 85.6 fl (ref 78.0–100.0)
Monocytes Absolute: 1.3 10*3/uL — ABNORMAL HIGH (ref 0.1–1.0)
Monocytes Relative: 10.3 % (ref 3.0–12.0)
NEUTROS ABS: 7.7 10*3/uL (ref 1.4–7.7)
Neutrophils Relative %: 61.8 % (ref 43.0–77.0)
PLATELETS: 191 10*3/uL (ref 150.0–400.0)
RBC: 4.94 Mil/uL (ref 4.22–5.81)
RDW: 14.5 % (ref 11.5–15.5)
WBC: 12.4 10*3/uL — AB (ref 4.0–10.5)

## 2015-05-20 LAB — MICROALBUMIN / CREATININE URINE RATIO
CREATININE, U: 385.7 mg/dL
MICROALB/CREAT RATIO: 0.8 mg/g (ref 0.0–30.0)
Microalb, Ur: 2.9 mg/dL — ABNORMAL HIGH (ref 0.0–1.9)

## 2015-05-20 LAB — HEPATIC FUNCTION PANEL
ALBUMIN: 3.8 g/dL (ref 3.5–5.2)
ALT: 17 U/L (ref 0–53)
AST: 21 U/L (ref 0–37)
Alkaline Phosphatase: 62 U/L (ref 39–117)
BILIRUBIN DIRECT: 0.2 mg/dL (ref 0.0–0.3)
TOTAL PROTEIN: 6.7 g/dL (ref 6.0–8.3)
Total Bilirubin: 0.5 mg/dL (ref 0.2–1.2)

## 2015-05-20 LAB — BASIC METABOLIC PANEL
BUN: 15 mg/dL (ref 6–23)
CALCIUM: 9.4 mg/dL (ref 8.4–10.5)
CHLORIDE: 103 meq/L (ref 96–112)
CO2: 31 mEq/L (ref 19–32)
CREATININE: 1.14 mg/dL (ref 0.40–1.50)
GFR: 70.07 mL/min (ref >60.00)
Glucose, Bld: 104 mg/dL — ABNORMAL HIGH (ref 70–99)
Potassium: 4.8 mEq/L (ref 3.5–5.1)
SODIUM: 142 meq/L (ref 135–145)

## 2015-05-20 LAB — LIPID PANEL
CHOL/HDL RATIO: 3
Cholesterol: 151 mg/dL (ref 0–200)
HDL: 44.9 mg/dL (ref >39.00)
LDL Cholesterol: 66 mg/dL (ref 0–99)
NonHDL: 106.28
TRIGLYCERIDES: 200 mg/dL — AB (ref 0.0–149.0)
VLDL: 40 mg/dL (ref 0.0–40.0)

## 2015-05-26 ENCOUNTER — Ambulatory Visit: Payer: PPO | Admitting: Licensed Clinical Social Worker

## 2015-05-27 ENCOUNTER — Ambulatory Visit (HOSPITAL_COMMUNITY): Payer: Self-pay | Admitting: Psychiatry

## 2015-05-31 ENCOUNTER — Telehealth: Payer: Self-pay | Admitting: Endocrinology

## 2015-05-31 ENCOUNTER — Telehealth (HOSPITAL_COMMUNITY): Payer: Self-pay

## 2015-05-31 DIAGNOSIS — F39 Unspecified mood [affective] disorder: Secondary | ICD-10-CM

## 2015-05-31 MED ORDER — LAMOTRIGINE 200 MG PO TABS: 200.0000 mg | ORAL_TABLET | Freq: Every day | ORAL | Status: DC

## 2015-05-31 MED ORDER — BUPROPION HCL ER (XL) 150 MG PO TB24: ORAL_TABLET | ORAL | Status: DC

## 2015-05-31 MED ORDER — CLOMIPHENE CITRATE 50 MG PO TABS: ORAL_TABLET | ORAL | Status: DC

## 2015-05-31 NOTE — Telephone Encounter (Signed)
Rx sent per pt's request.  

## 2015-05-31 NOTE — Telephone Encounter (Signed)
Please call in [facility] to [facility]

## 2015-05-31 NOTE — Telephone Encounter (Signed)
Medication management - Telephone call with patient who reported need for a refill of Lamictal and Wellbutrin as his appointment was moved back until 06/23/15 due to provider being out.  Pt. requests refills be sent to his [place].  Met with Dr. Adele Schilder who approved one time refills of patient's Lamictal and Wellbutrin XL and agreed to send to patient's [place].  New orders e-scribed to patient's [facility] as approved and patient to now return on 06/23/15 for next evaluation .

## 2015-06-02 ENCOUNTER — Ambulatory Visit (HOSPITAL_COMMUNITY): Payer: Self-pay | Admitting: Psychiatry

## 2015-06-02 ENCOUNTER — Ambulatory Visit: Payer: PPO | Admitting: Licensed Clinical Social Worker

## 2015-06-03 ENCOUNTER — Other Ambulatory Visit (INDEPENDENT_AMBULATORY_CARE_PROVIDER_SITE_OTHER): Payer: PPO

## 2015-06-03 DIAGNOSIS — D72829 Elevated white blood cell count, unspecified: Secondary | ICD-10-CM | POA: Diagnosis not present

## 2015-06-03 LAB — CBC WITH DIFFERENTIAL/PLATELET
BASOS ABS: 0 10*3/uL (ref 0.0–0.1)
Basophils Relative: 0.3 % (ref 0.0–3.0)
EOS ABS: 0.3 10*3/uL (ref 0.0–0.7)
Eosinophils Relative: 3.7 % (ref 0.0–5.0)
HEMATOCRIT: 44 % (ref 39.0–52.0)
Hemoglobin: 14.6 g/dL (ref 13.0–17.0)
LYMPHS PCT: 24.8 % (ref 12.0–46.0)
Lymphs Abs: 2.3 10*3/uL (ref 0.7–4.0)
MCHC: 33.3 g/dL (ref 30.0–36.0)
MCV: 85.9 fl (ref 78.0–100.0)
Monocytes Absolute: 1.1 10*3/uL — ABNORMAL HIGH (ref 0.1–1.0)
Monocytes Relative: 12.2 % — ABNORMAL HIGH (ref 3.0–12.0)
NEUTROS ABS: 5.5 10*3/uL (ref 1.4–7.7)
NEUTROS PCT: 59 % (ref 43.0–77.0)
PLATELETS: 204 10*3/uL (ref 150.0–400.0)
RBC: 5.12 Mil/uL (ref 4.22–5.81)
RDW: 14.6 % (ref 11.5–15.5)
WBC: 9.3 10*3/uL (ref 4.0–10.5)

## 2015-06-08 ENCOUNTER — Ambulatory Visit: Payer: PPO | Admitting: Licensed Clinical Social Worker

## 2015-06-09 ENCOUNTER — Ambulatory Visit (INDEPENDENT_AMBULATORY_CARE_PROVIDER_SITE_OTHER): Payer: PPO | Admitting: Licensed Clinical Social Worker

## 2015-06-09 DIAGNOSIS — F4323 Adjustment disorder with mixed anxiety and depressed mood: Secondary | ICD-10-CM

## 2015-06-23 ENCOUNTER — Ambulatory Visit (HOSPITAL_COMMUNITY): Payer: Self-pay | Admitting: Psychiatry

## 2015-07-01 ENCOUNTER — Encounter: Payer: Self-pay | Admitting: Cardiology

## 2015-07-01 ENCOUNTER — Ambulatory Visit (INDEPENDENT_AMBULATORY_CARE_PROVIDER_SITE_OTHER): Payer: PPO | Admitting: Cardiology

## 2015-07-01 VITALS — BP 152/92 | HR 95 | Ht 74.0 in | Wt 192.0 lb

## 2015-07-01 DIAGNOSIS — I10 Essential (primary) hypertension: Secondary | ICD-10-CM

## 2015-07-01 DIAGNOSIS — I359 Nonrheumatic aortic valve disorder, unspecified: Secondary | ICD-10-CM | POA: Diagnosis not present

## 2015-07-01 DIAGNOSIS — I351 Nonrheumatic aortic (valve) insufficiency: Secondary | ICD-10-CM

## 2015-07-01 NOTE — Patient Instructions (Signed)
Medication Instructions:  No changes today  Labwork: None today  Testing/Procedures: None   Follow-Up: Your physician wants you to follow-up in: 1 year with Dr Aundra Dubin. (December 2017).  You will receive a reminder letter in the mail two months in advance. If you don't receive a letter, please call our office to schedule the follow-up appointment.        If you need a refill on your cardiac medications before your next appointment, please call your pharmacy.

## 2015-07-03 NOTE — Progress Notes (Signed)
Patient ID: KHRYSTIAN SCHMICK, male   DOB: 04/03/1956, 59 y.o.   MRN: [id number] PCP: Dr. Etter Sjogren  59 yo with history of HTN, smoking, low back pain on disability, and hyperlipidemia presents for cardiology followup.  He is not very active due to back pain.  He follows in a pain clinic.  No exertional dyspnea or chest pain.  He quit smoking this summer.  Rare palpitations, mostly resolved with use of metoprolol.  BP high today but in 120s when he checks at home.  [place]   Labs (12/13): K 3.8, creatinine 1.0, TSH normal, TGs 391, HDL 36, LDL 109 Labs (7/14): K 4.5, creatinine 0.9, LDL 106, HDL 37 Labs (1/15): LDL 94, HDL 32, TGs 278 Labs (11/6): K 4.8, creatinine 1.14, LDL 66, HDL 45  ECG: NSR, RBBB  PMH: 1. HTN 2. Hyperlipidemia 3. Active smoker with suspected COPD 4. Depression 5. Migraines 6. BPH 7. OSA 8. Low back pain s/p L-spine surgery.  9. Aortic insufficiency: Possibly due to HTN.  Echo (12/13) with EF 55-60%, moderate LVH, grade I diastolic dysfunction, mild to moderate AI, trileaflet aortic valve.  Echo (5/16) with EF 55-60%, mild AI, mild MR. 10. Palpitations: Event monitor 1/14 with no atrial fibrillation.  There was a short run of idioventricular rhythm.   SH: Separated.  Quit smoking summer 2016.  On disability from back pain.  Lives in [place].   FH: Uncle with CABG.  Cousin with atrial fibrillation.   ROS: All systems reviewed and negative except as per HPI.   Current Outpatient Prescriptions  Medication Sig Dispense Refill  . aspirin EC 81 MG tablet Take 1 tablet (81 mg total) by mouth daily.    . benazepril (LOTENSIN) 20 MG tablet Take 1 tablet (20 mg total) by mouth daily. 90 tablet 3  . buPROPion (WELLBUTRIN XL) 150 MG 24 hr tablet TAKE ONE TABLET BY MOUTH EVERY MORNING 30 tablet 0  . clomiPHENE (CLOMID) 50 MG tablet TAKE ONE-FOURTH TABLET BY MOUTH DAILY 10 tablet 2  . diazepam (VALIUM) 5 MG tablet TAKE ONE TABLET  BY MOUTH EVERY 6 HOURS AS NEEDED FOR ANXIETY (SPASMS) 60 tablet 0  . finasteride (PROSCAR) 5 MG tablet Take 5 mg by mouth daily.    Marland Kitchen lamoTRIgine (LAMICTAL) 200 MG tablet Take 1 tablet (200 mg total) by mouth daily. 30 tablet 0  . methadone (DOLOPHINE) 10 MG tablet Takes as directed    . metoprolol tartrate (LOPRESSOR) 25 MG tablet Take 1 tablet (25 mg total) by mouth 2 (two) times daily. 180 tablet 3  . oxyCODONE-acetaminophen (PERCOCET) 10-325 MG per tablet Take 1 tablet by mouth every 8 (eight) hours as needed. For pain    . pantoprazole (PROTONIX) 40 MG tablet TAKE ONE TABLET BY MOUTH DAILY 30 tablet 11  . pravastatin (PRAVACHOL) 40 MG tablet Take 1 tablet (40 mg total) by mouth every evening. 90 tablet 3  . QUEtiapine (SEROQUEL) 50 MG tablet Take 1 tablet (50 mg total) by mouth at bedtime. 30 tablet 2  . rizatriptan (MAXALT) 10 MG tablet Take 10 mg by mouth as needed. May repeat in 2 hours if needed no more than 2 tablet in 24 hours For migraines    . sildenafil (REVATIO) 20 MG tablet 2-5 tabs as needed for ED symptoms 50 tablet 2  . tamsulosin (FLOMAX) 0.4 MG CAPS capsule Take 0.4 mg by mouth daily.    . [DISCONTINUED] vardenafil (LEVITRA)  20 MG tablet Take 20 mg by mouth as directed.       No current facility-administered medications for this visit.    BP 152/92 mmHg  Pulse 95  Ht 6\' 2"  (1.88 m)  Wt 192 lb (87.091 kg)  BMI 24.64 kg/m2 General: NAD Neck: No JVD, no thyromegaly or thyroid nodule.  Lungs: Prolonged expiratory phase. CV: Nondisplaced PMI.  Heart regular S1/S2, no S3/S4, 1/6 SEM RUSB.  No peripheral edema.  No carotid bruit.  Normal pedal pulses.  Abdomen: Soft, nontender, no hepatosplenomegaly, no distention.  Neurologic: Alert and oriented x 3.  Psych: Normal affect. Extremities: No clubbing or cyanosis.   Assessment/Plan: 1. HTN: BP high today but has been ok at home.  2. Aortic insufficiency: Improved, mild on last echo.  The aortic valve is trileaflet.  The  AI may have been related to HTN.  Continue BP control.      3. Palpitations: Much improved on metoprolol.   4. Smoking: I congratulated him on quitting. 5. Hyperlipidemia: He is on pravastatin, last lipids were ok.   Followup in 1 year if he is still living in [place].   Loralie Champagne 07/03/2015

## 2015-07-13 ENCOUNTER — Other Ambulatory Visit (HOSPITAL_COMMUNITY): Payer: Self-pay | Admitting: Psychiatry

## 2015-07-13 DIAGNOSIS — F39 Unspecified mood [affective] disorder: Secondary | ICD-10-CM

## 2015-07-15 ENCOUNTER — Telehealth: Payer: Self-pay | Admitting: Family Medicine

## 2015-07-15 DIAGNOSIS — F39 Unspecified mood [affective] disorder: Secondary | ICD-10-CM

## 2015-07-15 MED ORDER — BUPROPION HCL ER (XL) 300 MG PO TB24: ORAL_TABLET | ORAL | Status: DC

## 2015-07-15 MED ORDER — BUPROPION HCL ER (XL) 150 MG PO TB24: ORAL_TABLET | ORAL | Status: DC

## 2015-07-15 NOTE — Telephone Encounter (Signed)
Patient called back about getting a rx for depression. States that he saw his Pain Management doctor and was advised to ask PCP for a depression medication

## 2015-07-15 NOTE — Telephone Encounter (Signed)
Caller name:Derel Relationship to patient:self Can be reached:[phone] Pharmacy: [facility] [place] [facility]   Reason for call:he needs a refill of his diazepam

## 2015-07-15 NOTE — Telephone Encounter (Signed)
Met with Dr. Salem Senate, helping to cover for Dr.Arfeen off this week, who approved one time refills of patient's prescribed Wellbutrin and Lamictal.  Both one time orders e-scribed to patient's [facility] [address].  Called patient who had left a message with request to inform orders had been sent to his [place] and reminded of appointment set for 07/19/15.

## 2015-07-15 NOTE — Telephone Encounter (Signed)
Last seen 02/08/15 and filled 05/07/15 #60  Please advise      KP

## 2015-07-15 NOTE — Telephone Encounter (Signed)
Spoke with patient and he agreed to the 300 mg of the Wellbutrin, he said he was in a restaurant and will call back to schedule the follow up.   KP

## 2015-07-15 NOTE — Telephone Encounter (Signed)
Patient states Dr. Adele Schilder is out until February and there is no coverage for him. Would like to know if you could increase one of the depression medications he is currently taking. Would you like for me to have him come in for OV?

## 2015-07-15 NOTE — Telephone Encounter (Signed)
Pt is already on a lot of depression meds---  He should talk to Dr Adele Schilder-- psych

## 2015-07-15 NOTE — Telephone Encounter (Signed)
Inc wellbutrin xl to 300 mg ====  Ov in 1 month

## 2015-07-29 ENCOUNTER — Ambulatory Visit (INDEPENDENT_AMBULATORY_CARE_PROVIDER_SITE_OTHER): Payer: PPO | Admitting: Psychiatry

## 2015-07-29 ENCOUNTER — Encounter (HOSPITAL_COMMUNITY): Payer: Self-pay | Admitting: Psychiatry

## 2015-07-29 VITALS — BP 150/81 | HR 94 | Ht 74.0 in | Wt 186.2 lb

## 2015-07-29 DIAGNOSIS — F39 Unspecified mood [affective] disorder: Secondary | ICD-10-CM

## 2015-07-29 DIAGNOSIS — F319 Bipolar disorder, unspecified: Secondary | ICD-10-CM

## 2015-07-29 MED ORDER — LAMOTRIGINE 200 MG PO TABS: 200.0000 mg | ORAL_TABLET | Freq: Every day | ORAL | Status: DC

## 2015-07-29 MED ORDER — BUPROPION HCL ER (XL) 300 MG PO TB24: ORAL_TABLET | ORAL | Status: DC

## 2015-07-29 MED ORDER — QUETIAPINE FUMARATE 100 MG PO TABS: 50.0000 mg | ORAL_TABLET | Freq: Every day | ORAL | Status: DC
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
## 2015-08-10 ENCOUNTER — Telehealth: Payer: Self-pay | Admitting: Family Medicine

## 2015-08-10 NOTE — Telephone Encounter (Signed)
Pt declined flu shot stating he had 1 in his life and got very sick. Offered to schedule CPE in July 2017, [place] like [place], pt to call insurance and find out if they will cover 1 per calendar year or if he has to wait 366 days.

## 2015-08-10 NOTE — Telephone Encounter (Signed)
Documented.   KP 

## 2015-09-09 DIAGNOSIS — M1288 Other specific arthropathies, not elsewhere classified, other specified site: Secondary | ICD-10-CM | POA: Diagnosis not present

## 2015-09-09 DIAGNOSIS — M5417 Radiculopathy, lumbosacral region: Secondary | ICD-10-CM | POA: Diagnosis not present

## 2015-09-09 DIAGNOSIS — M542 Cervicalgia: Secondary | ICD-10-CM | POA: Diagnosis not present

## 2015-09-09 DIAGNOSIS — K5903 Drug induced constipation: Secondary | ICD-10-CM | POA: Diagnosis not present

## 2015-09-16 ENCOUNTER — Telehealth: Payer: Self-pay | Admitting: Family Medicine

## 2015-09-16 ENCOUNTER — Encounter (HOSPITAL_COMMUNITY): Payer: Self-pay | Admitting: Emergency Medicine

## 2015-09-16 DIAGNOSIS — R202 Paresthesia of skin: Secondary | ICD-10-CM | POA: Insufficient documentation

## 2015-09-16 DIAGNOSIS — M1990 Unspecified osteoarthritis, unspecified site: Secondary | ICD-10-CM | POA: Insufficient documentation

## 2015-09-16 DIAGNOSIS — Z87442 Personal history of urinary calculi: Secondary | ICD-10-CM | POA: Insufficient documentation

## 2015-09-16 DIAGNOSIS — Z79899 Other long term (current) drug therapy: Secondary | ICD-10-CM | POA: Diagnosis not present

## 2015-09-16 DIAGNOSIS — Z7982 Long term (current) use of aspirin: Secondary | ICD-10-CM | POA: Insufficient documentation

## 2015-09-16 DIAGNOSIS — F329 Major depressive disorder, single episode, unspecified: Secondary | ICD-10-CM | POA: Insufficient documentation

## 2015-09-16 DIAGNOSIS — R200 Anesthesia of skin: Secondary | ICD-10-CM | POA: Diagnosis not present

## 2015-09-16 DIAGNOSIS — G8929 Other chronic pain: Secondary | ICD-10-CM | POA: Diagnosis not present

## 2015-09-16 DIAGNOSIS — I10 Essential (primary) hypertension: Secondary | ICD-10-CM | POA: Diagnosis not present

## 2015-09-16 DIAGNOSIS — R51 Headache: Secondary | ICD-10-CM | POA: Insufficient documentation

## 2015-09-16 DIAGNOSIS — H538 Other visual disturbances: Secondary | ICD-10-CM | POA: Diagnosis not present

## 2015-09-16 DIAGNOSIS — Z87891 Personal history of nicotine dependence: Secondary | ICD-10-CM | POA: Insufficient documentation

## 2015-09-16 DIAGNOSIS — N400 Enlarged prostate without lower urinary tract symptoms: Secondary | ICD-10-CM | POA: Diagnosis not present

## 2015-09-16 DIAGNOSIS — E7800 Pure hypercholesterolemia, unspecified: Secondary | ICD-10-CM | POA: Insufficient documentation

## 2015-09-16 NOTE — ED Notes (Addendum)
Pt states for the last 2 weeks he has had a tingling sensation in his right shoulder and into his back that has been radiating into the right side of his neck and jaw, pt denies any pain just a "buzzing" sensation. Pt denies any injury. Pt denies any numbness.

## 2015-09-16 NOTE — Telephone Encounter (Signed)
Attempted to reach the patient for follow-up. Left message for a call back when available.

## 2015-09-16 NOTE — Telephone Encounter (Signed)
Pt called in stating numbness and tingling (like when your hand falls asleep) in his right back, shoulder, and neck. Transferred call to [facility] with [facility] [employer].

## 2015-09-16 NOTE — Telephone Encounter (Signed)
[facility] - Client [place] [facility] Patient Name: Willie Andrews DOB: Jun 05, 1956 Initial Comment Caller stated he is having tingling and numbness in the right side of his neck, back, and shoulder. Nurse Assessment Nurse: Martyn Ehrich, RN, [place] Date/Time (Eastern Time): 09/16/2015 3:49:10 PM Confirm and document reason for call. If symptomatic, describe symptoms. You must click the next button to save text entered. ---PT has numbness in R shoulder and R neck onset suddenly 2 weeks ago Has the patient traveled out of the country within the last 30 days? ---No Does the patient have any new or worsening symptoms? ---Yes Will a triage be completed? ---Yes Related visit to physician within the last 2 weeks? ---NoDoes the PT have any chronic conditions? (i.e. diabetes, asthma, etc.) ---Yes List chronic conditions. ---chronic back pain Is this a behavioral health or substance abuse call? ---No Guidelines Guideline Title Affirmed Question Affirmed Notes Neurologic Deficit [1] Numbness (i.e., loss of sensation) of the face, arm / hand, or leg / foot on one side of the body AND [2] sudden onset AND [3] present now Final Disposition User Call EMS 911 Now Mart, RN, [place] Comments caller said he will call 911 after he gets someone to come take care of grandchildren - (perhaps in 37 min). He wanted to make appt for nail fungus with Dr. Etter Sjogren. Told him can't do that when we recommend 911 but will send a note in this record to office about his requestDisagree/Comply: Comply Call Id: [id number]

## 2015-09-17 ENCOUNTER — Encounter (HOSPITAL_COMMUNITY): Payer: Self-pay | Admitting: *Deleted

## 2015-09-17 ENCOUNTER — Encounter: Payer: Self-pay | Admitting: Endocrinology

## 2015-09-17 ENCOUNTER — Ambulatory Visit (INDEPENDENT_AMBULATORY_CARE_PROVIDER_SITE_OTHER): Payer: PPO | Admitting: Endocrinology

## 2015-09-17 ENCOUNTER — Emergency Department (HOSPITAL_COMMUNITY)
Admission: EM | Admit: 2015-09-17 | Discharge: 2015-09-17 | Disposition: A | Discharge status: Home / Self Care | Payer: PPO | Attending: Emergency Medicine | Admitting: Emergency Medicine

## 2015-09-17 VITALS — BP 162/94 | HR 89 | Temp 98.2°F | Ht 74.0 in | Wt 191.0 lb

## 2015-09-17 DIAGNOSIS — E291 Testicular hypofunction: Secondary | ICD-10-CM | POA: Diagnosis not present

## 2015-09-17 DIAGNOSIS — R202 Paresthesia of skin: Secondary | ICD-10-CM

## 2015-09-17 DIAGNOSIS — R7989 Other specified abnormal findings of blood chemistry: Secondary | ICD-10-CM

## 2015-09-17 MED ORDER — CLOMIPHENE CITRATE 50 MG PO TABS: ORAL_TABLET | ORAL | Status: DC

## 2015-09-17 NOTE — ED Notes (Signed)
Pt c/o R shoulder tingling radiating into R jaw and upper arm. Denies pain in shoulder, described as "bees under my skin". Pt has significant back issues; has spinal stimulator in place. Pt denies injury to area

## 2015-09-17 NOTE — Discharge Instructions (Signed)
Paresthesia Paresthesia is an abnormal burning or prickling sensation. This sensation is generally felt in the hands, arms, legs, or feet. However, it may occur in any part of the body. Usually, it is not painful. The feeling may be described as:  Tingling or numbness.  Pins and needles.  Skin crawling.  Buzzing.  Limbs falling asleep.  Itching. Most people experience temporary (transient) paresthesia at some time in their lives. Paresthesia may occur when you breathe too quickly (hyperventilation). It can also occur without any apparent cause. Commonly, paresthesia occurs when pressure is placed on a nerve. The sensation quickly goes away after the pressure is removed. For some people, however, paresthesia is a long-lasting (chronic) condition that is caused by an underlying disorder. If you continue to have paresthesia, you may need further medical evaluation. HOME CARE INSTRUCTIONS Watch your condition for any changes. Taking the following actions may help to lessen any discomfort that you are feeling:  Avoid drinking alcohol.  Try acupuncture or massage to help relieve your symptoms.  Keep all follow-up visits as directed by your health care provider. This is important. SEEK MEDICAL CARE IF:  You continue to have episodes of paresthesia.  Your burning or prickling feeling gets worse when you walk.  You have pain, cramps, or dizziness.  You develop a rash. SEEK IMMEDIATE MEDICAL CARE IF:  You feel weak.  You have trouble walking or moving.  You have problems with speech, understanding, or vision.  You feel confused.  You cannot control your bladder or bowel movements.  You have numbness after an injury.  You faint.   This information is not intended to replace advice given to you by your health care provider. Make sure you discuss any questions you have with your health care provider.   Document Released: 06/23/2002 Document Revised: 11/17/2014 Document Reviewed:  06/29/2014 Elsevier Interactive Patient Education 2016 Elsevier Inc.  

## 2015-09-17 NOTE — ED Provider Notes (Signed)
CSN: [id number]     Arrival date & time 09/16/15  2240 History   By signing my name below, I, [name], attest that this documentation has been prepared under the direction and in the presence of Ripley Fraise, MD.   Electronically Signed: [name], ED Scribe. 09/17/2015. 12:30 AM    Chief Complaint  Patient presents with  . Tingling    The history is provided by the patient. No language interpreter was used.   HPI Comments: Willie Andrews is a 60 y.o. male who presents to the Emergency Department complaining of gradual onset, constant tingling in his right shoulder, onset two weeks ago. Pt also complains of associated blurry vision and headache but that is improved. The tingling radiates into his right arm, right neck, and right jaw. He reports that the tingling feels similar to when his feet fall asleep. Pt says the tingling is worsened when he stands and when he bends over. No alleviating factors noted.  Pt denies chest pain, abdominal pain, tingling or numbness in legs, tingling or numbness in face, vomiting, difficulty ambulating, dizziness, or any other pertinent symptoms. He called his PCP today and was instructed to come into the ED.  No significant neck pain is reported  Past Medical History  Diagnosis Date  . Arthritis   . Depression   . High cholesterol   . Kidney stones   . Migraine   . Benign prostatic hypertrophy     Dr. Rosana Hoes  . Hypertension   . Seasonal allergies   . Sleep apnea   . Chronic pain   . History of echocardiogram     a.  Echo 5/16:  EF 55-60%, mild AI, mild MR, mild LAE.   Past Surgical History  Procedure Laterality Date  . L5-s1    . Elbow surgery      Left 2008  . Neck surgery      Disc 2002 and 2008  . Shoulder surgery      Right  . Back surgery      1989, 1994, 2002, 2008  . Nasal sinus surgery      2004  . Back surgery  2012    Neuro Implant --elect. stimulator  . Spine surgery  85 94   Family History  Problem  Relation Age of Onset  . Arthritis    . Alcohol abuse    . Cancer Mother     unknown type  . Heart attack Neg Hx   . Stroke Neg Hx   . Hypertension Mother    Social History  Substance Use Topics  . Smoking status: Former Smoker -- 1.50 packs/day for 45 years    Start date: 12/28/2014  . Smokeless tobacco: Never Used     Comment: Using the E-cig  . Alcohol Use: 0.0 oz/week    0 Standard drinks or equivalent per week     Comment: 1 drink per night    Review of Systems  Respiratory: Negative for shortness of breath.   Cardiovascular: Negative for chest pain.  Gastrointestinal: Negative for vomiting and abdominal pain.  Neurological: Positive for numbness and headaches. Negative for dizziness.       Numbness is in right shoulder and radiates to right arm, neck, and right side of jaw.   All other systems reviewed and are negative.   Allergies  Butrans; Ketorolac tromethamine; Morphine and related; and Opana  Home Medications   Prior to Admission medications   Medication Sig Start Date End Date  Taking? Authorizing Provider  aspirin EC 81 MG tablet Take 1 tablet (81 mg total) by mouth daily. 07/23/12   Larey Dresser, MD  benazepril (LOTENSIN) 20 MG tablet Take 1 tablet (20 mg total) by mouth daily. 11/10/14   Liliane Shi, PA-C  buPROPion (WELLBUTRIN XL) 300 MG 24 hr tablet TAKE ONE TABLET BY MOUTH EVERY MORNING 07/29/15   Kathlee Nations, MD  clomiPHENE (CLOMID) 50 MG tablet TAKE ONE-FOURTH TABLET BY MOUTH DAILY 05/31/15   Renato Shin, MD  diazepam (VALIUM) 5 MG tablet TAKE ONE TABLET BY MOUTH EVERY 6 HOURS AS NEEDED FOR ANXIETY (SPASMS) 05/07/15   Rosalita Chessman, DO  finasteride (PROSCAR) 5 MG tablet Take 5 mg by mouth daily.    Historical Provider, MD  lamoTRIgine (LAMICTAL) 200 MG tablet Take 1 tablet (200 mg total) by mouth daily. 07/29/15   Kathlee Nations, MD  methadone (DOLOPHINE) 10 MG tablet Takes as directed 08/14/14   Historical Provider, MD  metoprolol tartrate  (LOPRESSOR) 25 MG tablet Take 1 tablet (25 mg total) by mouth 2 (two) times daily. 05/25/14   Larey Dresser, MD  oxyCODONE-acetaminophen (PERCOCET) 10-325 MG per tablet Take 1 tablet by mouth every 8 (eight) hours as needed. For pain    Historical Provider, MD  pantoprazole (PROTONIX) 40 MG tablet TAKE ONE TABLET BY MOUTH DAILY 03/26/15   Rosalita Chessman, DO  pravastatin (PRAVACHOL) 40 MG tablet Take 1 tablet (40 mg total) by mouth every evening. 05/25/14   Larey Dresser, MD  QUEtiapine (SEROQUEL) 100 MG tablet Take 0.5 tablets (50 mg total) by mouth at bedtime. 07/29/15   Kathlee Nations, MD  sildenafil (REVATIO) 20 MG tablet 2-5 tabs as needed for ED symptoms 09/18/14   Renato Shin, MD  tamsulosin (FLOMAX) 0.4 MG CAPS capsule Take 0.4 mg by mouth daily.    Historical Provider, MD   BP 183/90 mmHg  Pulse 83  Temp(Src) 97.5 F (36.4 C) (Oral)  Resp 16  Ht 6\' 2"  (1.88 m)  Wt 186 lb (84.369 kg)  BMI 23.87 kg/m2  SpO2 96% Physical Exam CONSTITUTIONAL: Well developed/well nourished HEAD: Normocephalic/atraumatic EYES: EOMI/PERRL ENMT: Mucous membranes moist NECK: supple no meningeal signs. No bruits.  SPINE/BACK:entire spine nontender CV: S1/S2 noted, no murmurs/rubs/gallops noted LUNGS: Lungs are clear to auscultation bilaterally, no apparent distress ABDOMEN: soft, nontender, no rebound or guarding, bowel sounds noted throughout abdomen GU:no cva tenderness NEURO: Pt is awake/alert/appropriate, moves all extremitiesx4.  No facial droop. No arm or leg drift. No ataxia. Equal power (5/5) with hand grip, wrist flex/extension, elbow flex/extension, and equal power with shoulder abduction/adduction.  No focal sensory deficit to light touch is noted in either UE.   EXTREMITIES: pulses normal/equal, full ROM. No tenderness or erythema to right shoulder.  No edema to upper extremities SKIN: warm, color normal PSYCH: no abnormalities of mood noted, alert and oriented to situation  ED Course   Procedures  DIAGNOSTIC STUDIES: Oxygen Saturation is 96% on RA, adequate by my interpretation.    COORDINATION OF CARE: 12:55 AM-Discussed treatment plan which includes EKG with pt at bedside and pt agreed to plan.   BP is each arm is similar and no significant difference He has no weakness/numbness at this time, he reports it only occurs with position changes It is mostly in right shoulder and right neck when it occurs, occasionally in jaw. No weakness reported I doubt CVA I doubt ACS/MI He denies any color changes to arm and  no pulse deficit ?subclavian steal syndrome, but at this time pt is without deficits or any other acute emergent condition I advised close PCP f/u next week We discussed strict return precautions    EKG Interpretation   Date/Time:  Friday September 17 2015 01:05:43 EST Ventricular Rate:  76 PR Interval:  134 QRS Duration: 133 QT Interval:  401 QTC Calculation: 451 R Axis:   39 Text Interpretation:  Sinus rhythm Right bundle branch block RBBB is new  on this EKG Confirmed by Christy Gentles  MD, Elenore Rota ([id number]) on 09/17/2015 1:13:41  AM      MDM   Final diagnoses:  Paresthesia   Nursing notes including past medical history and social history reviewed and considered in documentation   I personally performed the services described in this documentation, which was scribed in my presence. The recorded information has been reviewed and is accurate.      Ripley Fraise, MD 09/17/15 [id number]

## 2015-09-17 NOTE — Patient Instructions (Addendum)
i have sent a refill of the clomiphene, to [facility] A blood test is requested for you today.  Please do in 1-2 months.  We'll let you know about the results. Testosterone treatment has risks, including increased or decreased fertility (depending on the type of treatment), hair loss, prostate cancer, benign prostate enlargement, blood clots, liver problems, lower hdl ("good cholesterol"), polycythemia (opposite of anemia), sleep apnea, and behavior changes.  Please come back for a follow-up appointment in 1 year.

## 2015-09-17 NOTE — ED Notes (Signed)
Pt ambulatory to room A02 with RN and with steady gait

## 2015-09-17 NOTE — Progress Notes (Signed)
Subjective:    Patient ID: Willie Andrews, male    DOB: 1955-07-22, 60 y.o.   MRN: [id number]  HPI Pt returns for f/u of idiopathic central hypogonadism (dx'ed 2013; he had puberty at the normal age; he has 2 biological children; he says he has never taken illicit androgens.; he took androgel x a few weeks, in 2013, but not since then; he was started on clomid in mid-2015). He says clomid was too expensive at [facility].  Main symptom is fatigue Past Medical History  Diagnosis Date  . Arthritis   . Depression   . High cholesterol   . Kidney stones   . Migraine   . Benign prostatic hypertrophy     Dr. Rosana Hoes  . Hypertension   . Seasonal allergies   . Sleep apnea   . Chronic pain   . History of echocardiogram     a.  Echo 5/16:  EF 55-60%, mild AI, mild MR, mild LAE.    Past Surgical History  Procedure Laterality Date  . L5-s1    . Elbow surgery      Left 2008  . Neck surgery      Disc 2002 and 2008  . Shoulder surgery      Right  . Back surgery      1989, 1994, 2002, 2008  . Nasal sinus surgery      2004  . Back surgery  2012    Neuro Implant --elect. stimulator  . Spine surgery  85 94  . Spinal cord stimulator implant      Social History   Social History  . Marital Status: Divorced    Spouse Name: N/A  . Number of Children: N/A  . Years of Education: N/A   Occupational History  . disabled    Social History Main Topics  . Smoking status: Former Smoker -- 1.50 packs/day for 45 years    Start date: 12/28/2014  . Smokeless tobacco: Never Used     Comment: Using the E-cig  . Alcohol Use: 0.0 oz/week    0 Standard drinks or equivalent per week     Comment: 1 drink per night  . Drug Use: No  . Sexual Activity: Not on file   Other Topics Concern  . Not on file   Social History Narrative    Current Outpatient Prescriptions on File Prior to Visit  Medication Sig Dispense Refill  . aspirin EC 81 MG tablet Take 1 tablet (81 mg total) by mouth daily.    .  benazepril (LOTENSIN) 20 MG tablet Take 1 tablet (20 mg total) by mouth daily. 90 tablet 3  . buPROPion (WELLBUTRIN XL) 300 MG 24 hr tablet TAKE ONE TABLET BY MOUTH EVERY MORNING 30 tablet 1  . diazepam (VALIUM) 5 MG tablet TAKE ONE TABLET BY MOUTH EVERY 6 HOURS AS NEEDED FOR ANXIETY (SPASMS) 60 tablet 0  . finasteride (PROSCAR) 5 MG tablet Take 5 mg by mouth daily.    Marland Kitchen lamoTRIgine (LAMICTAL) 200 MG tablet Take 1 tablet (200 mg total) by mouth daily. 30 tablet 1  . methadone (DOLOPHINE) 10 MG tablet Takes as directed    . metoprolol tartrate (LOPRESSOR) 25 MG tablet Take 1 tablet (25 mg total) by mouth 2 (two) times daily. 180 tablet 3  . oxyCODONE-acetaminophen (PERCOCET) 10-325 MG per tablet Take 1 tablet by mouth every 8 (eight) hours as needed. For pain    . pantoprazole (PROTONIX) 40 MG tablet TAKE ONE TABLET BY MOUTH DAILY  30 tablet 11  . pravastatin (PRAVACHOL) 40 MG tablet Take 1 tablet (40 mg total) by mouth every evening. 90 tablet 3  . QUEtiapine (SEROQUEL) 100 MG tablet Take 0.5 tablets (50 mg total) by mouth at bedtime. 30 tablet 1  . sildenafil (REVATIO) 20 MG tablet 2-5 tabs as needed for ED symptoms 50 tablet 2  . tamsulosin (FLOMAX) 0.4 MG CAPS capsule Take 0.4 mg by mouth daily.    . [DISCONTINUED] vardenafil (LEVITRA) 20 MG tablet Take 20 mg by mouth as directed.       No current facility-administered medications on file prior to visit.    Allergies  Allergen Reactions  . Butrans [Buprenorphine Hcl] Hives and Itching  . Ketorolac Tromethamine Other (See Comments)    toradol   . Morphine And Related Other (See Comments)    "does nothing to help me"  . Opana [Oxymorphone] Other (See Comments)    Leg cramps    Family History  Problem Relation Age of Onset  . Arthritis    . Alcohol abuse    . Cancer Mother     unknown type  . Heart attack Neg Hx   . Stroke Neg Hx   . Hypertension Mother     BP 162/94 mmHg  Pulse 89  Temp(Src) 98.2 F (36.8 C) (Oral)  Ht 6'  2" (1.88 m)  Wt 191 lb (86.637 kg)  BMI 24.51 kg/m2  SpO2 99%  Review of Systems viagra helps ED symptoms.      Objective:   Physical Exam VITAL SIGNS:  See vs page GENERAL: no distress GENITALIA: Normal male testicles, scrotum, and penis     Assessment & Plan:  Hypogonadism: therapy limited by noncompliance.    Patient is advised the following: Patient Instructions  i have sent a refill of the [facility], [facility] A blood test is requested for you today.  Please do in 1-2 months.  We'll let you know about the results. Testosterone treatment has risks, including increased or decreased fertility (depending on the type of treatment), hair loss, prostate cancer, benign prostate enlargement, blood clots, liver problems, lower hdl ("good cholesterol"), polycythemia (opposite of anemia), sleep apnea, and behavior changes.  Please come back for a follow-up appointment in 1 year.

## 2015-09-20 ENCOUNTER — Telehealth: Payer: Self-pay | Admitting: Endocrinology

## 2015-09-20 MED ORDER — CLOMIPHENE CITRATE 50 MG PO TABS: ORAL_TABLET | ORAL | Status: DC

## 2015-09-20 NOTE — Telephone Encounter (Signed)
I contacted the pt. Pt stated the medication was going to cost him over [facility]$ at [facility] and requested the refill to be sent to [facility] [place] [address]. Rx has been sent and pt was advised to call back if he had any issues picking the med up.

## 2015-09-20 NOTE — Telephone Encounter (Signed)
Pt said that the medication that Dr. Loanne Drilling prescribed for him last week is way more than he expected and would like to talk to you

## 2015-09-27 ENCOUNTER — Encounter (INDEPENDENT_AMBULATORY_CARE_PROVIDER_SITE_OTHER): Payer: Self-pay

## 2015-09-27 ENCOUNTER — Ambulatory Visit (INDEPENDENT_AMBULATORY_CARE_PROVIDER_SITE_OTHER): Payer: PPO | Admitting: Family Medicine

## 2015-09-27 ENCOUNTER — Encounter: Payer: Self-pay | Admitting: Family Medicine

## 2015-09-27 ENCOUNTER — Ambulatory Visit (HOSPITAL_BASED_OUTPATIENT_CLINIC_OR_DEPARTMENT_OTHER)
Admission: RE | Admit: 2015-09-27 | Discharge: 2015-09-27 | Disposition: A | Discharge status: Home / Self Care | Payer: PPO | Source: Ambulatory Visit | Attending: Family Medicine | Admitting: Family Medicine

## 2015-09-27 VITALS — BP 126/81 | HR 90 | Temp 98.3°F | Ht 74.0 in | Wt 193.0 lb

## 2015-09-27 DIAGNOSIS — Z981 Arthrodesis status: Secondary | ICD-10-CM | POA: Insufficient documentation

## 2015-09-27 DIAGNOSIS — M4322 Fusion of spine, cervical region: Secondary | ICD-10-CM | POA: Diagnosis not present

## 2015-09-27 DIAGNOSIS — I10 Essential (primary) hypertension: Secondary | ICD-10-CM | POA: Diagnosis not present

## 2015-09-27 DIAGNOSIS — E785 Hyperlipidemia, unspecified: Secondary | ICD-10-CM

## 2015-09-27 DIAGNOSIS — M542 Cervicalgia: Secondary | ICD-10-CM

## 2015-09-27 DIAGNOSIS — Z0000 Encounter for general adult medical examination without abnormal findings: Secondary | ICD-10-CM | POA: Diagnosis not present

## 2015-09-27 DIAGNOSIS — J302 Other seasonal allergic rhinitis: Secondary | ICD-10-CM

## 2015-09-27 DIAGNOSIS — Z1159 Encounter for screening for other viral diseases: Secondary | ICD-10-CM

## 2015-09-27 DIAGNOSIS — M4802 Spinal stenosis, cervical region: Secondary | ICD-10-CM | POA: Diagnosis not present

## 2015-09-27 DIAGNOSIS — B351 Tinea unguium: Secondary | ICD-10-CM

## 2015-09-27 LAB — CBC WITH DIFFERENTIAL/PLATELET
BASOS ABS: 0 10*3/uL (ref 0.0–0.1)
Basophils Relative: 0.4 % (ref 0.0–3.0)
EOS ABS: 0.3 10*3/uL (ref 0.0–0.7)
Eosinophils Relative: 2.6 % (ref 0.0–5.0)
HCT: 48.2 % (ref 39.0–52.0)
Hemoglobin: 16.1 g/dL (ref 13.0–17.0)
LYMPHS ABS: 3.1 10*3/uL (ref 0.7–4.0)
Lymphocytes Relative: 24.9 % (ref 12.0–46.0)
MCHC: 33.3 g/dL (ref 30.0–36.0)
MCV: 84.3 fl (ref 78.0–100.0)
Monocytes Absolute: 1 10*3/uL (ref 0.1–1.0)
Monocytes Relative: 7.8 % (ref 3.0–12.0)
NEUTROS ABS: 8 10*3/uL — AB (ref 1.4–7.7)
NEUTROS PCT: 64.3 % (ref 43.0–77.0)
PLATELETS: 223 10*3/uL (ref 150.0–400.0)
RBC: 5.72 Mil/uL (ref 4.22–5.81)
RDW: 14.7 % (ref 11.5–15.5)
WBC: 12.5 10*3/uL — ABNORMAL HIGH (ref 4.0–10.5)

## 2015-09-27 LAB — COMPREHENSIVE METABOLIC PANEL
ALK PHOS: 88 U/L (ref 39–117)
ALT: 27 U/L (ref 0–53)
AST: 23 U/L (ref 0–37)
Albumin: 4.7 g/dL (ref 3.5–5.2)
BILIRUBIN TOTAL: 0.5 mg/dL (ref 0.2–1.2)
BUN: 17 mg/dL (ref 6–23)
CALCIUM: 9.8 mg/dL (ref 8.4–10.5)
CO2: 28 meq/L (ref 19–32)
CREATININE: 1.08 mg/dL (ref 0.40–1.50)
Chloride: 104 mEq/L (ref 96–112)
GFR: 74.49 mL/min (ref >60.00)
GLUCOSE: 108 mg/dL — AB (ref 70–99)
Potassium: 5 mEq/L (ref 3.5–5.1)
Sodium: 142 mEq/L (ref 135–145)
TOTAL PROTEIN: 7.4 g/dL (ref 6.0–8.3)

## 2015-09-27 LAB — LIPID PANEL
CHOLESTEROL: 208 mg/dL — AB (ref 0–200)
HDL: 60.4 mg/dL (ref >39.00)
LDL Cholesterol: 117 mg/dL — ABNORMAL HIGH (ref 0–99)
NonHDL: 147.72
TRIGLYCERIDES: 155 mg/dL — AB (ref 0.0–149.0)
Total CHOL/HDL Ratio: 3
VLDL: 31 mg/dL (ref 0.0–40.0)

## 2015-09-27 LAB — PSA: PSA: 1.22 ng/mL (ref 0.10–4.00)

## 2015-09-27 LAB — TSH: TSH: 0.92 u[IU]/mL (ref 0.35–4.50)

## 2015-09-27 MED ORDER — EFINACONAZOLE 10 % EX SOLN: CUTANEOUS | Status: DC

## 2015-09-27 MED ORDER — FLUTICASONE PROPIONATE 50 MCG/ACT NA SUSP: 2.0000 | Freq: Every day | NASAL | Status: AC

## 2015-09-27 MED ORDER — DIAZEPAM 5 MG PO TABS: ORAL_TABLET | ORAL | Status: DC

## 2015-09-27 NOTE — Progress Notes (Signed)
Patient ID: Willie Andrews, male    DOB: 1955-07-28  Age: 60 y.o. MRN: [id number]    Subjective:  Subjective HPI Willie Andrews presents for pt f/u ER for R shoulder and neck pain.  No known injury He also c/o L thumb nail is thick and yellow.  Review of Systems  Constitutional: Negative.  Negative for diaphoresis, appetite change, fatigue and unexpected weight change.  HENT: Negative for congestion, ear pain, hearing loss, nosebleeds, postnasal drip, rhinorrhea, sinus pressure, sneezing and tinnitus.   Eyes: Negative for photophobia, pain, discharge, redness, itching and visual disturbance.  Respiratory: Negative.  Negative for cough, chest tightness, shortness of breath and wheezing.   Cardiovascular: Negative.  Negative for chest pain, palpitations and leg swelling.  Gastrointestinal: Negative for abdominal pain, constipation, blood in stool, abdominal distention and anal bleeding.  Endocrine: Negative.  Negative for cold intolerance, heat intolerance, polydipsia, polyphagia and polyuria.  Genitourinary: Negative.  Negative for dysuria, frequency and difficulty urinating.  Musculoskeletal: Negative.   Skin: Negative.   Allergic/Immunologic: Negative.   Neurological: Negative for dizziness, weakness, light-headedness, numbness and headaches.  Psychiatric/Behavioral: Negative for suicidal ideas, confusion, sleep disturbance, dysphoric mood, decreased concentration and agitation. The patient is not nervous/anxious.     History Past Medical History  Diagnosis Date  . Arthritis   . Depression   . High cholesterol   . Kidney stones   . Migraine   . Benign prostatic hypertrophy     Dr. Earlene Plater  . Hypertension   . Seasonal allergies   . Sleep apnea   . Chronic pain   . History of echocardiogram     a.  Echo 5/16:  EF 55-60%, mild AI, mild MR, mild LAE.    He has past surgical history that includes L5-S1; Elbow surgery; Neck surgery; Shoulder surgery; Back surgery; Nasal sinus  surgery; Back surgery (2012); Spine surgery (85 94); and Spinal cord stimulator implant.   His family history includes Cancer in his mother; Hypertension in his mother. There is no history of Heart attack or Stroke.He reports that he has quit smoking. He started smoking about 8 months ago. He has never used smokeless tobacco. He reports that he drinks alcohol. He reports that he does not use illicit drugs.  Current Outpatient Prescriptions on File Prior to Visit  Medication Sig Dispense Refill  . aspirin EC 81 MG tablet Take 1 tablet (81 mg total) by mouth daily.    . benazepril (LOTENSIN) 20 MG tablet Take 1 tablet (20 mg total) by mouth daily. 90 tablet 3  . buPROPion (WELLBUTRIN XL) 300 MG 24 hr tablet TAKE ONE TABLET BY MOUTH EVERY MORNING 30 tablet 1  . finasteride (PROSCAR) 5 MG tablet Take 5 mg by mouth daily.    Marland Kitchen lamoTRIgine (LAMICTAL) 200 MG tablet Take 1 tablet (200 mg total) by mouth daily. 30 tablet 1  . methadone (DOLOPHINE) 10 MG tablet Takes as directed    . metoprolol tartrate (LOPRESSOR) 25 MG tablet Take 1 tablet (25 mg total) by mouth 2 (two) times daily. 180 tablet 3  . oxyCODONE-acetaminophen (PERCOCET) 10-325 MG per tablet Take 1 tablet by mouth every 8 (eight) hours as needed. For pain    . pantoprazole (PROTONIX) 40 MG tablet TAKE ONE TABLET BY MOUTH DAILY 30 tablet 11  . pravastatin (PRAVACHOL) 40 MG tablet Take 1 tablet (40 mg total) by mouth every evening. 90 tablet 3  . QUEtiapine (SEROQUEL) 100 MG tablet Take 0.5 tablets (50 mg total) by  mouth at bedtime. 30 tablet 1  . sildenafil (REVATIO) 20 MG tablet 2-5 tabs as needed for ED symptoms 50 tablet 2  . tamsulosin (FLOMAX) 0.4 MG CAPS capsule Take 0.4 mg by mouth daily.    . clomiPHENE (CLOMID) 50 MG tablet TAKE ONE-FOURTH TABLET BY MOUTH DAILY (Patient not taking: Reported on 09/27/2015) 30 tablet 2  . [DISCONTINUED] vardenafil (LEVITRA) 20 MG tablet Take 20 mg by mouth as directed.       No current  facility-administered medications on file prior to visit.     Objective:  Objective Physical Exam  Constitutional: He is oriented to person, place, and time. Vital signs are normal. He appears well-developed and well-nourished. He is sleeping.  HENT:  Head: Normocephalic and atraumatic.  Mouth/Throat: Oropharynx is clear and moist.  Eyes: EOM are normal. Pupils are equal, round, and reactive to light.  Neck: Normal range of motion. Neck supple. No thyromegaly present.  Cardiovascular: Normal rate and regular rhythm.   No murmur heard. Pulmonary/Chest: Effort normal and breath sounds normal. No respiratory distress. He has no wheezes. He has no rales. He exhibits no tenderness.  Musculoskeletal: He exhibits no edema or tenderness.  Neurological: He is alert and oriented to person, place, and time.  Skin: Skin is warm and dry.     Psychiatric: He has a normal mood and affect. His behavior is normal. Judgment and thought content normal.  Nursing note and vitals reviewed.  BP 126/81 mmHg  Pulse 90  Temp(Src) 98.3 F (36.8 C) (Oral)  Ht '6\' 2"'$  (1.88 m)  Wt 193 lb (87.544 kg)  BMI 24.77 kg/m2  SpO2 90% Wt Readings from Last 3 Encounters:  09/27/15 193 lb (87.544 kg)  09/17/15 191 lb (86.637 kg)  09/16/15 186 lb (84.369 kg)     Lab Results  Component Value Date   WBC 12.5* 09/27/2015   HGB 16.1 09/27/2015   HCT 48.2 09/27/2015   PLT 223.0 09/27/2015   GLUCOSE 108* 09/27/2015   CHOL 208* 09/27/2015   TRIG 155.0* 09/27/2015   HDL 60.40 09/27/2015   LDLDIRECT 93.6 07/22/2013   LDLCALC 117* 09/27/2015   ALT 27 09/27/2015   AST 23 09/27/2015   NA 142 09/27/2015   K 5.0 09/27/2015   CL 104 09/27/2015   CREATININE 1.08 09/27/2015   BUN 17 09/27/2015   CO2 28 09/27/2015   TSH 0.92 09/27/2015   PSA 1.22 09/27/2015   HGBA1C 6.2 11/08/2012   MICROALBUR 2.9* 05/20/2015    No results found.   Assessment & Plan:  Plan I am having Willie Andrews start on Efinaconazole and  fluticasone. I am also having him maintain his oxyCODONE-acetaminophen, aspirin EC, metoprolol tartrate, pravastatin, methadone, sildenafil, finasteride, tamsulosin, benazepril, pantoprazole, buPROPion, lamoTRIgine, QUEtiapine, clomiPHENE, and diazepam.  Meds ordered this encounter  Medications  . diazepam (VALIUM) 5 MG tablet    Sig: TAKE ONE TABLET BY MOUTH EVERY 6 HOURS AS NEEDED FOR ANXIETY (SPASMS)    Dispense:  60 tablet    Refill:  0  . Efinaconazole (JUBLIA) 10 % SOLN    Sig: Apply qd x 48 weeks    Dispense:  4 mL    Refill:  3  . fluticasone (FLONASE) 50 MCG/ACT nasal spray    Sig: Place 2 sprays into both nostrils daily.    Dispense:  16 g    Refill:  6    Problem List Items Addressed This Visit    Hyperlipidemia   Relevant Orders   Comp  Met (CMET) (Completed)   Lipid panel (Completed)   POCT urinalysis dipstick   PSA (Completed)   TSH (Completed)   Essential hypertension   Relevant Orders   CBC with Differential/Platelet (Completed)   POCT urinalysis dipstick   PSA (Completed)   TSH (Completed)    Other Visit Diagnoses    Neck pain    -  Primary    Relevant Medications    diazepam (VALIUM) 5 MG tablet    Other Relevant Orders    DG Cervical Spine Complete (Completed)    Preventative health care        Relevant Orders    Comp Met (CMET) (Completed)    CBC with Differential/Platelet (Completed)    Lipid panel (Completed)    POCT urinalysis dipstick    PSA (Completed)    TSH (Completed)    Ambulatory referral to Gastroenterology    Onychomycosis        Relevant Medications    Efinaconazole (JUBLIA) 10 % SOLN    Need for hepatitis C screening test        Relevant Orders    Hepatitis C antibody    Seasonal allergies        Relevant Medications    fluticasone (FLONASE) 50 MCG/ACT nasal spray       Follow-up: Return in about 3 months (around 12/28/2015) for annual exam, fasting.  Garnet Koyanagi, DO

## 2015-09-27 NOTE — Progress Notes (Signed)
Pre visit review using our clinic review tool, if applicable. No additional management support is needed unless otherwise documented below in the visit note. 

## 2015-09-27 NOTE — Patient Instructions (Signed)
Nail Ringworm A fungal infection of the nail (tinea unguium/onychomycosis) is common. It is common as the visible part of the nail is composed of dead cells which have no blood supply to help prevent infection. It occurs because fungi are everywhere and will pick any opportunity to grow on any dead material. Because nails are very slow growing they require up to 2 years of treatment with anti-fungal medications. The entire nail back to the base is infected. This includes approximately  of the nail which you cannot see. If your caregiver has prescribed a medication by mouth, take it every day and as directed. No progress will be seen for at least 6 to 9 months. Do not be disappointed! Because fungi live on dead cells with little or no exposure to blood supply, medication delivery to the infection is slow; thus the cure is slow. It is also why you can observe no progress in the first 6 months. The nail becoming cured is the base of the nail, as it has the blood supply. Topical medication such as creams and ointments are usually not effective. Important in successful treatment of nail fungus is closely following the medication regimen that your doctor prescribes. Sometimes you and your caregiver may elect to speed up this process by surgical removal of all the nails. Even this may still require 6 to 9 months of additional oral medications. See your caregiver as directed. Remember there will be no visible improvement for at least 6 months. See your caregiver sooner if other signs of infection (redness and swelling) develop.   This information is not intended to replace advice given to you by your health care provider. Make sure you discuss any questions you have with your health care provider.   Document Released: 06/30/2000 Document Revised: 11/17/2014 Document Reviewed: 01/04/2015 Elsevier Interactive Patient Education 2016 [employer]  

## 2015-09-28 LAB — HEPATITIS C ANTIBODY: HCV AB: NEGATIVE

## 2015-09-29 ENCOUNTER — Encounter (HOSPITAL_COMMUNITY): Payer: Self-pay | Admitting: Psychiatry

## 2015-09-29 ENCOUNTER — Ambulatory Visit (INDEPENDENT_AMBULATORY_CARE_PROVIDER_SITE_OTHER): Payer: PPO | Admitting: Psychiatry

## 2015-09-29 VITALS — BP 176/83 | HR 99 | Ht 74.0 in | Wt 194.0 lb

## 2015-09-29 DIAGNOSIS — F39 Unspecified mood [affective] disorder: Secondary | ICD-10-CM

## 2015-09-29 DIAGNOSIS — F319 Bipolar disorder, unspecified: Secondary | ICD-10-CM | POA: Diagnosis not present

## 2015-09-29 MED ORDER — QUETIAPINE FUMARATE 100 MG PO TABS: 100.0000 mg | ORAL_TABLET | Freq: Every day | ORAL | Status: DC

## 2015-09-29 MED ORDER — LAMOTRIGINE 200 MG PO TABS: 200.0000 mg | ORAL_TABLET | Freq: Every day | ORAL | Status: DC

## 2015-09-29 MED ORDER — BUPROPION HCL ER (XL) 300 MG PO TB24: ORAL_TABLET | ORAL | Status: DC
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
## 2015-09-30 ENCOUNTER — Telehealth: Payer: Self-pay

## 2015-09-30 ENCOUNTER — Ambulatory Visit: Payer: Self-pay | Admitting: Internal Medicine

## 2015-09-30 ENCOUNTER — Encounter: Payer: Self-pay | Admitting: Internal Medicine

## 2015-09-30 DIAGNOSIS — M4802 Spinal stenosis, cervical region: Secondary | ICD-10-CM

## 2015-09-30 MED ORDER — CICLOPIROX 8 % EX SOLN: Freq: Every day | CUTANEOUS | Status: DC

## 2015-09-30 NOTE — Telephone Encounter (Signed)
Do [place] instead Ct -- with out

## 2015-09-30 NOTE — Telephone Encounter (Signed)
Spoke with [patient and the Jublia was 1300 dollars. He has also agreed to the CT of the cervical spine, which CT do you want w or w/o. Please advise on alternative.    KP

## 2015-09-30 NOTE — Telephone Encounter (Signed)
-----   Message from Oneta Rack sent at 09/29/2015  2:55 PM EDT ----- Regarding: returning your call / CT and Fungus medication  Contact: [phone] Patient returning your call regarding CT scan and fungus medication prescribed is not covered by insurance as per patient it's a few thousand $$$$.

## 2015-09-30 NOTE — Telephone Encounter (Signed)
Patient aware Rx was faxed and the CT scan was ordered.     KP

## 2015-10-03 ENCOUNTER — Ambulatory Visit (HOSPITAL_BASED_OUTPATIENT_CLINIC_OR_DEPARTMENT_OTHER)
Admission: RE | Admit: 2015-10-03 | Discharge: 2015-10-03 | Disposition: A | Discharge status: Home / Self Care | Payer: PPO | Source: Ambulatory Visit | Attending: Family Medicine | Admitting: Family Medicine

## 2015-10-03 DIAGNOSIS — M4804 Spinal stenosis, thoracic region: Secondary | ICD-10-CM | POA: Insufficient documentation

## 2015-10-03 DIAGNOSIS — M4802 Spinal stenosis, cervical region: Secondary | ICD-10-CM | POA: Insufficient documentation

## 2015-10-03 DIAGNOSIS — M5124 Other intervertebral disc displacement, thoracic region: Secondary | ICD-10-CM | POA: Insufficient documentation

## 2015-10-03 DIAGNOSIS — M47812 Spondylosis without myelopathy or radiculopathy, cervical region: Secondary | ICD-10-CM | POA: Diagnosis not present

## 2015-10-04 NOTE — Telephone Encounter (Signed)
Pt is requesting a call back from [place]. Pt says that he checked with the pharmacy after speaking with CMA and was informed that they havent received anything from PCP.   Please assist pt further.

## 2015-10-05 ENCOUNTER — Other Ambulatory Visit: Payer: Self-pay

## 2015-10-05 ENCOUNTER — Other Ambulatory Visit: Payer: Self-pay | Admitting: Acute Care

## 2015-10-05 DIAGNOSIS — Z87891 Personal history of nicotine dependence: Secondary | ICD-10-CM

## 2015-10-05 DIAGNOSIS — M5124 Other intervertebral disc displacement, thoracic region: Secondary | ICD-10-CM

## 2015-10-05 DIAGNOSIS — M5134 Other intervertebral disc degeneration, thoracic region: Secondary | ICD-10-CM

## 2015-10-05 DIAGNOSIS — I10 Essential (primary) hypertension: Secondary | ICD-10-CM

## 2015-10-05 DIAGNOSIS — R002 Palpitations: Secondary | ICD-10-CM

## 2015-10-05 DIAGNOSIS — M4804 Spinal stenosis, thoracic region: Secondary | ICD-10-CM

## 2015-10-05 MED ORDER — PRAVASTATIN SODIUM 40 MG PO TABS: 40.0000 mg | ORAL_TABLET | Freq: Every evening | ORAL | Status: DC

## 2015-10-05 NOTE — Telephone Encounter (Signed)
Rx re-faxed and the patient has been made aware.    KP

## 2015-10-11 ENCOUNTER — Other Ambulatory Visit: Payer: Self-pay

## 2015-10-11 DIAGNOSIS — M5124 Other intervertebral disc displacement, thoracic region: Secondary | ICD-10-CM

## 2015-10-11 DIAGNOSIS — M5134 Other intervertebral disc degeneration, thoracic region: Secondary | ICD-10-CM

## 2015-10-11 DIAGNOSIS — M4804 Spinal stenosis, thoracic region: Secondary | ICD-10-CM

## 2015-10-12 ENCOUNTER — Encounter: Payer: Self-pay | Admitting: Internal Medicine

## 2015-10-13 ENCOUNTER — Other Ambulatory Visit: Payer: Self-pay | Admitting: Cardiology

## 2015-10-22 DIAGNOSIS — I10 Essential (primary) hypertension: Secondary | ICD-10-CM | POA: Diagnosis not present

## 2015-10-22 DIAGNOSIS — Z6825 Body mass index (BMI) 25.0-25.9, adult: Secondary | ICD-10-CM | POA: Diagnosis not present

## 2015-10-22 DIAGNOSIS — M5412 Radiculopathy, cervical region: Secondary | ICD-10-CM | POA: Diagnosis not present

## 2015-10-28 ENCOUNTER — Encounter: Payer: Self-pay | Admitting: Family Medicine

## 2015-10-28 ENCOUNTER — Telehealth: Payer: Self-pay | Admitting: Family Medicine

## 2015-10-28 NOTE — Telephone Encounter (Signed)
Attempted to contact patient to reschedule appointment with Dr. Carollee Herter on January 21, 2016. Left voicemail, sent My Chart message, mailed letter, appointment cancelled on 10/28/2015

## 2015-11-02 DIAGNOSIS — M791 Myalgia: Secondary | ICD-10-CM | POA: Diagnosis not present

## 2015-11-02 DIAGNOSIS — G894 Chronic pain syndrome: Secondary | ICD-10-CM | POA: Diagnosis not present

## 2015-11-02 DIAGNOSIS — Z79891 Long term (current) use of opiate analgesic: Secondary | ICD-10-CM | POA: Diagnosis not present

## 2015-11-02 DIAGNOSIS — M5416 Radiculopathy, lumbar region: Secondary | ICD-10-CM | POA: Diagnosis not present

## 2015-11-02 DIAGNOSIS — G4709 Other insomnia: Secondary | ICD-10-CM | POA: Diagnosis not present

## 2015-11-02 DIAGNOSIS — M5417 Radiculopathy, lumbosacral region: Secondary | ICD-10-CM | POA: Diagnosis not present

## 2015-11-02 DIAGNOSIS — K5903 Drug induced constipation: Secondary | ICD-10-CM | POA: Diagnosis not present

## 2015-11-02 DIAGNOSIS — M47812 Spondylosis without myelopathy or radiculopathy, cervical region: Secondary | ICD-10-CM | POA: Diagnosis not present

## 2015-11-08 ENCOUNTER — Ambulatory Visit: Payer: Self-pay | Admitting: Neurology

## 2015-11-08 DIAGNOSIS — M1288 Other specific arthropathies, not elsewhere classified, other specified site: Secondary | ICD-10-CM | POA: Diagnosis not present

## 2015-11-08 DIAGNOSIS — M542 Cervicalgia: Secondary | ICD-10-CM | POA: Diagnosis not present

## 2015-11-08 DIAGNOSIS — M5417 Radiculopathy, lumbosacral region: Secondary | ICD-10-CM | POA: Diagnosis not present

## 2015-11-08 DIAGNOSIS — K5903 Drug induced constipation: Secondary | ICD-10-CM | POA: Diagnosis not present

## 2015-11-11 ENCOUNTER — Other Ambulatory Visit: Payer: Self-pay

## 2015-11-11 DIAGNOSIS — Z6825 Body mass index (BMI) 25.0-25.9, adult: Secondary | ICD-10-CM | POA: Diagnosis not present

## 2015-11-11 DIAGNOSIS — M5412 Radiculopathy, cervical region: Secondary | ICD-10-CM | POA: Diagnosis not present

## 2015-11-15 ENCOUNTER — Other Ambulatory Visit: Payer: Self-pay | Admitting: Cardiology

## 2015-11-15 ENCOUNTER — Other Ambulatory Visit: Payer: Self-pay | Admitting: Family Medicine

## 2015-11-15 NOTE — Telephone Encounter (Signed)
Last seen 09/27/15 and filled 09/27/15 #[id number]   Please advise     KP

## 2015-11-24 ENCOUNTER — Ambulatory Visit (INDEPENDENT_AMBULATORY_CARE_PROVIDER_SITE_OTHER): Payer: PPO | Admitting: Internal Medicine

## 2015-11-24 ENCOUNTER — Encounter: Payer: Self-pay | Admitting: Internal Medicine

## 2015-11-24 ENCOUNTER — Telehealth: Payer: Self-pay | Admitting: Internal Medicine

## 2015-11-24 VITALS — BP 110/70 | HR 68 | Ht 74.0 in | Wt 199.8 lb

## 2015-11-24 DIAGNOSIS — K5909 Other constipation: Secondary | ICD-10-CM

## 2015-11-24 DIAGNOSIS — K219 Gastro-esophageal reflux disease without esophagitis: Secondary | ICD-10-CM

## 2015-11-24 DIAGNOSIS — Z1211 Encounter for screening for malignant neoplasm of colon: Secondary | ICD-10-CM

## 2015-11-24 MED ORDER — NA SULFATE-K SULFATE-MG SULF 17.5-3.13-1.6 GM/177ML PO SOLN: 1.0000 | Freq: Once | ORAL | Status: DC

## 2015-11-24 NOTE — Patient Instructions (Signed)

## 2015-11-24 NOTE — Progress Notes (Signed)
HISTORY OF PRESENT ILLNESS:  Willie Andrews is a 60 y.o. male who is referred by his primary care provider Dr. Etter Sjogren regarding chief complaints of screening colonoscopy, chronic constipation, and GERD. Multiple outside records have been reviewed including previous endoscopic evaluations from providers outside of this group. This is his first GI visit here. The patient has a history of ischemic colitis for which she was hospitalized in 2006. Underwent flexible sigmoidoscopy by Dr. Watt Climes in 2006. The diagnosis was confirmed endoscopically and microscopically with biopsies. He recovered without recurrence. The patient is on chronic narcotics for chronic neck and back pain. Has had multiple previous surgeries. He has had significant chronic constipation related to this. Has been problematic at times. Currently on a bowel regimen that includes 6 Ex-Lax per day. With this regimen his bowel movements are adequate. No bleeding. No family history of colon cancer. Next. He does have chronic reflux disease which she has had for several years. For this, he takes pantoprazole 40 mg daily. On medication no reflux symptoms. No history of dysphagia. Weight has been stable. He does not use alcohol and is not smoking currently. 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  REVIEW OF SYSTEMS:  All non-GI ROS negative except for back pain, neck pain, depression, arthritis, sinus and allergies  Past Medical History  Diagnosis Date  . Arthritis   . Depression   . High cholesterol   . Kidney stones   . Migraine   . Benign prostatic hypertrophy     Dr. Rosana Hoes  . Hypertension   . Seasonal allergies   . Sleep apnea   . Chronic pain   . History of echocardiogram     a.  Echo 5/16:  EF 55-60%, mild AI, mild MR, mild LAE.  Marland Kitchen Hemorrhoids   . Colon polyps   . Ischemic colitis [facility])     Past Surgical History  Procedure Laterality Date  . L5-s1    . Elbow surgery      Left 2008  . Neck surgery      Disc 2002 and  2008  . Shoulder surgery      Right  . Back surgery      1989, 1994, 2002, 2008  . Nasal sinus surgery      2004  . Back surgery  2012    Neuro Implant --elect. stimulator  . Spine surgery  85 94  . Spinal cord stimulator implant      Social History [place]  reports that he has quit smoking. He started smoking about 10 months ago. He has never used smokeless tobacco. He reports that he drinks alcohol. He reports that he does not use illicit drugs.  family history includes Cancer in his mother; Hypertension in his mother. There is no history of Heart attack or Stroke.  Allergies  Allergen Reactions  . Butrans [Buprenorphine Hcl] Hives and Itching  . Ketorolac Tromethamine Other (See Comments)    toradol   . Morphine And Related Other (See Comments)    "does nothing to help me"  . Opana [Oxymorphone] Other (See Comments)    Leg cramps  . Methadone Rash       PHYSICAL EXAMINATION: Vital signs: BP 110/70 mmHg  Pulse 68  Ht 6\' 2"  (1.88 m)  Wt 199 lb 12.8 oz (90.629 kg)  BMI 25.64 kg/m2  Constitutional: generally well-appearing, no acute distress, But moves gingerly Psychiatric: alert and oriented x3, cooperative Eyes: extraocular movements intact, anicteric, conjunctiva pink Mouth: oral  pharynx moist, no lesions Neck: supple without thyromegaly Lymph: no lymphadenopathy Cardiovascular: heart regular rate and rhythm, no murmur Lungs: clear to auscultation bilaterally Abdomen: soft, nontender, nondistended, no obvious ascites, no peritoneal signs, normal bowel sounds, no organomegaly Rectal: To be performed during upcoming colonoscopy Extremities: no clubbing cyanosis or lower extremity edema bilaterally Skin: no lesions on visible extremities Neuro: No focal deficits. DTRs intact. Cranial nerves intact. No asterixis.   ASSESSMENT:  #1. Chronic constipation. Narcotic induced. Seems to be doing well on Ex-Lax regimen #2. Chronic GERD. Symptoms managed with PPI.  No alarm features #3. Colon cancer screening. Appropriate candidate without contraindication #4. Chronic pain syndrome on chronic narcotics #5. History of ischemic colitis 2006 evaluated elsewhere as documented above   PLAN:  #1. Continue bowel regimen for now. Could consider naloxegol if current regimen becomes effective #2. Reflux precautions #3. Continue PPI. Lowest dose to control symptoms recommended #4. Screening colonoscopy. The patient is high-risk given his chronic narcotics. As such, MAC recommended for his sedation.The nature of the procedure, as well as the risks, benefits, and alternatives were carefully and thoroughly reviewed with the patient. Ample time for discussion and questions allowed. The patient understood, was satisfied, and agreed to proceed.  A copy of this consultation note has been sent to Dr. Etter Sjogren

## 2015-11-25 ENCOUNTER — Encounter: Payer: Self-pay | Admitting: Family Medicine

## 2015-11-25 ENCOUNTER — Ambulatory Visit (INDEPENDENT_AMBULATORY_CARE_PROVIDER_SITE_OTHER): Payer: PPO | Admitting: Family Medicine

## 2015-11-25 VITALS — BP 131/65 | HR 70 | Temp 98.2°F | Ht 74.0 in | Wt 201.6 lb

## 2015-11-25 DIAGNOSIS — K219 Gastro-esophageal reflux disease without esophagitis: Secondary | ICD-10-CM | POA: Diagnosis not present

## 2015-11-25 DIAGNOSIS — N400 Enlarged prostate without lower urinary tract symptoms: Secondary | ICD-10-CM | POA: Diagnosis not present

## 2015-11-25 DIAGNOSIS — I10 Essential (primary) hypertension: Secondary | ICD-10-CM

## 2015-11-25 DIAGNOSIS — Z0000 Encounter for general adult medical examination without abnormal findings: Secondary | ICD-10-CM | POA: Diagnosis not present

## 2015-11-25 DIAGNOSIS — R002 Palpitations: Secondary | ICD-10-CM

## 2015-11-25 DIAGNOSIS — E785 Hyperlipidemia, unspecified: Secondary | ICD-10-CM

## 2015-11-25 LAB — CBC WITH DIFFERENTIAL/PLATELET
BASOS PCT: 0.4 % (ref 0.0–3.0)
Basophils Absolute: 0 10*3/uL (ref 0.0–0.1)
EOS ABS: 0.2 10*3/uL (ref 0.0–0.7)
Eosinophils Relative: 2.5 % (ref 0.0–5.0)
HEMATOCRIT: 42.3 % (ref 39.0–52.0)
Hemoglobin: 14.2 g/dL (ref 13.0–17.0)
LYMPHS PCT: 25.1 % (ref 12.0–46.0)
Lymphs Abs: 2.4 10*3/uL (ref 0.7–4.0)
MCHC: 33.6 g/dL (ref 30.0–36.0)
MCV: 85.9 fl (ref 78.0–100.0)
Monocytes Absolute: 0.8 10*3/uL (ref 0.1–1.0)
Monocytes Relative: 8.2 % (ref 3.0–12.0)
NEUTROS ABS: 6.2 10*3/uL (ref 1.4–7.7)
Neutrophils Relative %: 63.8 % (ref 43.0–77.0)
PLATELETS: 258 10*3/uL (ref 150.0–400.0)
RBC: 4.92 Mil/uL (ref 4.22–5.81)
RDW: 15.1 % (ref 11.5–15.5)
WBC: 9.7 10*3/uL (ref 4.0–10.5)

## 2015-11-25 LAB — COMPREHENSIVE METABOLIC PANEL
ALT: 25 U/L (ref 0–53)
AST: 22 U/L (ref 0–37)
Albumin: 4.1 g/dL (ref 3.5–5.2)
Alkaline Phosphatase: 78 U/L (ref 39–117)
BUN: 16 mg/dL (ref 6–23)
CALCIUM: 9.1 mg/dL (ref 8.4–10.5)
CHLORIDE: 106 meq/L (ref 96–112)
CO2: 28 meq/L (ref 19–32)
CREATININE: 1.14 mg/dL (ref 0.40–1.50)
GFR: 69.94 mL/min (ref >60.00)
Glucose, Bld: 82 mg/dL (ref 70–99)
Potassium: 4.1 mEq/L (ref 3.5–5.1)
SODIUM: 142 meq/L (ref 135–145)
Total Bilirubin: 0.3 mg/dL (ref 0.2–1.2)
Total Protein: 6.7 g/dL (ref 6.0–8.3)

## 2015-11-25 LAB — LIPID PANEL
CHOL/HDL RATIO: 4
Cholesterol: 160 mg/dL (ref 0–200)
HDL: 44.9 mg/dL (ref >39.00)
LDL Cholesterol: 88 mg/dL (ref 0–99)
NONHDL: 115.01
TRIGLYCERIDES: 137 mg/dL (ref 0.0–149.0)
VLDL: 27.4 mg/dL (ref 0.0–40.0)

## 2015-11-25 MED ORDER — BENAZEPRIL HCL 20 MG PO TABS: 20.0000 mg | ORAL_TABLET | Freq: Every day | ORAL | Status: DC

## 2015-11-25 MED ORDER — PANTOPRAZOLE SODIUM 40 MG PO TBEC: 40.0000 mg | DELAYED_RELEASE_TABLET | Freq: Every day | ORAL | Status: DC

## 2015-11-25 MED ORDER — METOPROLOL TARTRATE 25 MG PO TABS
25.0000 mg | ORAL_TABLET | Freq: Two times a day (BID) | ORAL | Status: DC
Start: 2015-11-25 — End: 2016-05-10

## 2015-11-25 MED ORDER — PRAVASTATIN SODIUM 40 MG PO TABS: 40.0000 mg | ORAL_TABLET | Freq: Every evening | ORAL | Status: AC

## 2015-11-25 NOTE — Progress Notes (Signed)
Subjective:   Willie Andrews is a 60 y.o. male who presents for Medicare Annual/Subsequent preventive examination.  Review of Systems:   Review of Systems  Constitutional: Negative for activity change, appetite change and fatigue.  HENT: Negative for hearing loss, congestion, tinnitus and ear discharge.   Eyes: Negative for visual disturbance (see optho q1y -- vision corrected to 20/20 with glasses).  Respiratory: Negative for cough, chest tightness and shortness of breath.   Cardiovascular: Negative for chest pain, palpitations and leg swelling.  Gastrointestinal: Negative for abdominal pain, diarrhea, constipation and abdominal distention.  Genitourinary: Negative for urgency, frequency, decreased urine volume and difficulty urinating.  Musculoskeletal: + back pain, neck pain Skin: Negative for color change, pallor and rash.  Neurological: Negative for dizziness, light-headedness, numbness and headaches.  Hematological: Negative for adenopathy. Does not bruise/bleed easily.  Psychiatric/Behavioral: Negative for suicidal ideas, confusion, sleep disturbance, self-injury, dysphoric mood, decreased concentration and agitation.  Pt is able to read and write and can do all ADLs No risk for falling No abuse/ violence in home           Objective:    Vitals: BP 131/65 mmHg  Pulse 70  Temp(Src) 98.2 F (36.8 C) (Oral)  Ht '6\' 2"'$  (1.88 m)  Wt 201 lb 9.6 oz (91.445 kg)  BMI 25.87 kg/m2  SpO2 96%  Body mass index is 25.87 kg/(m^2). BP 131/65 mmHg  Pulse 70  Temp(Src) 98.2 F (36.8 C) (Oral)  Ht '6\' 2"'$  (1.88 m)  Wt 201 lb 9.6 oz (91.445 kg)  BMI 25.87 kg/m2  SpO2 96% General appearance: alert, cooperative, appears stated age and no distress Head: Normocephalic, without obvious abnormality, atraumatic Eyes: negative findings: lids and lashes normal, conjunctivae and sclerae normal and pupils equal, round, reactive to light and accomodation Ears: normal TM's and external ear  canals both ears Nose: Nares normal. Septum midline. Mucosa normal. No drainage or sinus tenderness. Throat: lips, mucosa, and tongue normal; teeth and gums normal Neck: no adenopathy, no carotid bruit, no JVD, supple, symmetrical, trachea midline and thyroid not enlarged, symmetric, no tenderness/mass/nodules Back: chronic pain -- in pain management Lungs: clear to auscultation bilaterally Chest wall: no tenderness Heart: S1, S2 normal Abdomen: soft, non-tender; bowel sounds normal; no masses,  no organomegaly Male genitalia: deferred---urology Rectal: deferred--urology Extremities: extremities normal, atraumatic, no cyanosis or edema Pulses: 2+ and symmetric Skin: Skin color, texture, turgor normal. No rashes or lesions Lymph nodes: Cervical, supraclavicular, and axillary nodes normal. Neurologic: -- per neurosurgery  Tobacco History  Smoking status  . Former Smoker -- 1.50 packs/day for 45 years  . Start date: 12/28/2014  Smokeless tobacco  . Never Used    Comment: Using the E-cig     Counseling given: Not Answered   Past Medical History  Diagnosis Date  . Arthritis   . Depression   . High cholesterol   . Kidney stones   . Migraine   . Benign prostatic hypertrophy     Dr. Rosana Hoes  . Hypertension   . Seasonal allergies   . Sleep apnea   . Chronic pain   . History of echocardiogram     a.  Echo 5/16:  EF 55-60%, mild AI, mild MR, mild LAE.  Marland Kitchen Hemorrhoids   . Colon polyps   . Ischemic colitis [facility])    Past Surgical History  Procedure Laterality Date  . L5-s1    . Elbow surgery      Left 2008  . Neck surgery  Disc 2002 and 2008  . Shoulder surgery      Right  . Back surgery      1989, 1994, 2002, 2008  . Nasal sinus surgery      2004  . Back surgery  2012    Neuro Implant --elect. stimulator  . Spine surgery  85 94  . Spinal cord stimulator implant     Family History  Problem Relation Age of Onset  . Arthritis    . Alcohol abuse    . Cancer  Mother     unknown type  . Heart attack Neg Hx   . Stroke Neg Hx   . Hypertension Mother    History  Sexual Activity  . Sexual Activity: Not on file    Outpatient Encounter Prescriptions as of 11/25/2015  Medication Sig  . aspirin EC 81 MG tablet Take 1 tablet (81 mg total) by mouth daily.  . benazepril (LOTENSIN) 20 MG tablet Take 1 tablet (20 mg total) by mouth daily.  Marland Kitchen buPROPion (WELLBUTRIN XL) 300 MG 24 hr tablet TAKE ONE TABLET BY MOUTH EVERY MORNING  . clomiPHENE (CLOMID) 50 MG tablet TAKE ONE-FOURTH TABLET BY MOUTH DAILY  . diazepam (VALIUM) 5 MG tablet TAKE ONE TABLET BY MOUTH EVERY 6 HOURS AS NEEDED FOR ANXIETY (SPASM)  . finasteride (PROSCAR) 5 MG tablet Take 5 mg by mouth daily.  . fluticasone (FLONASE) 50 MCG/ACT nasal spray Place 2 sprays into both nostrils daily.  Marland Kitchen lamoTRIgine (LAMICTAL) 200 MG tablet Take 1 tablet (200 mg total) by mouth daily.  . methadone (DOLOPHINE) 10 MG tablet Takes as directed  . metoprolol tartrate (LOPRESSOR) 25 MG tablet Take 1 tablet (25 mg total) by mouth 2 (two) times daily.  . Na Sulfate-K Sulfate-Mg Sulf SOLN Take 1 kit by mouth once.  . nabumetone (RELAFEN) 500 MG tablet Take 500 mg by mouth 2 (two) times daily.  Marland Kitchen oxyCODONE-acetaminophen (PERCOCET) 10-325 MG per tablet Take 1 tablet by mouth every 8 (eight) hours as needed. For pain  . pantoprazole (PROTONIX) 40 MG tablet Take 1 tablet (40 mg total) by mouth daily.  . pravastatin (PRAVACHOL) 40 MG tablet Take 1 tablet (40 mg total) by mouth every evening.  Marland Kitchen QUEtiapine (SEROQUEL) 100 MG tablet Take 1 tablet (100 mg total) by mouth at bedtime.  . sildenafil (REVATIO) 20 MG tablet 2-5 tabs as needed for ED symptoms  . tamsulosin (FLOMAX) 0.4 MG CAPS capsule Take 0.4 mg by mouth daily.  . [DISCONTINUED] benazepril (LOTENSIN) 20 MG tablet Take 1 tablet (20 mg total) by mouth daily.  . [DISCONTINUED] metoprolol tartrate (LOPRESSOR) 25 MG tablet TAKE ONE TABLET BY MOUTH TWICE DAILY  .  [DISCONTINUED] pantoprazole (PROTONIX) 40 MG tablet TAKE ONE TABLET BY MOUTH DAILY  . [DISCONTINUED] pravastatin (PRAVACHOL) 40 MG tablet Take 1 tablet (40 mg total) by mouth every evening.   No facility-administered encounter medications on file as of 11/25/2015.    Activities of Daily Living In your present state of health, do you have any difficulty performing the following activities: 11/25/2015  Hearing? N  Vision? N  Difficulty concentrating or making decisions? N  Walking or climbing stairs? N  Dressing or bathing? N  Doing errands, shopping? N    Patient Care Team: Ann Held, DO as PCP - General Kathie Rhodes, MD as Consulting Physician (Urology) Barnett Abu, LCSW as Social Worker (Licensed Clinical Social Worker) Sylvie Farrier, NP as Referring Physician (Pain Medicine) Janine Ores. Rauck, MD as  Physician Assistant (Pain Medicine) Lennie Odor, MD as Referring Physician (Pain Medicine)   Assessment:    cpe Exercise Activities and Dietary recommendations---walks-- 1/2-1/2 mile a day  Current Exercise Habits: Home exercise routine, Type of exercise: walking, Intensity: Mild, Exercise limited by: orthopedic condition(s)  Goals    None     Fall Risk Fall Risk  11/25/2015  Falls in the past year? No   Depression Screen PHQ 2/9 Scores 11/25/2015 02/13/2014 09/10/2012  PHQ - 2 Score 0 3 1  PHQ- 9 Score - 12 -    Cognitive Testing mmse 30/30  Immunization History  Administered Date(s) Administered  . Td 09/07/2003  . Tdap 01/02/2014   Screening Tests Health Maintenance  Topic Date Due  . COLONOSCOPY  02/14/2015  . INFLUENZA VACCINE  08/09/2016 (Originally 02/15/2016)  . TETANUS/TDAP  01/03/2024  . Hepatitis C Screening  Completed  . HIV Screening  Completed      Plan:    During the course of the visit the patient was educated and counseled about the following appropriate screening and preventive services:   Vaccines to include Pneumoccal,  Influenza, Hepatitis B, Td, Zostavax, HCV  Electrocardiogram  Cardiovascular Disease  Colorectal cancer screening  Diabetes screening  Prostate Cancer Screening  Glaucoma screening  Nutrition counseling   Smoking cessation counseling  Patient Instructions (the written plan) was given to the patient.   1. Essential hypertension con't meds stable - Comprehensive metabolic panel - CBC with Differential/Platelet - Lipid panel - POCT urinalysis dipstick  2. BPH (benign prostatic hypertrophy) Per urology - PSA  3. Hyperlipidemia con't statin  - Comprehensive metabolic panel - Lipid panel  4. Preventative health care See above  Ann Held, DO  11/25/2015

## 2015-11-25 NOTE — Patient Instructions (Signed)

## 2015-11-26 ENCOUNTER — Encounter: Payer: Self-pay | Admitting: Internal Medicine

## 2015-11-26 DIAGNOSIS — M5417 Radiculopathy, lumbosacral region: Secondary | ICD-10-CM | POA: Diagnosis not present

## 2015-11-26 DIAGNOSIS — K5903 Drug induced constipation: Secondary | ICD-10-CM | POA: Diagnosis not present

## 2015-11-26 DIAGNOSIS — M1288 Other specific arthropathies, not elsewhere classified, other specified site: Secondary | ICD-10-CM | POA: Diagnosis not present

## 2015-11-26 DIAGNOSIS — M542 Cervicalgia: Secondary | ICD-10-CM | POA: Diagnosis not present

## 2015-11-26 NOTE — Telephone Encounter (Signed)
Spoke with patient and told him I would be able to give him a sample prep next week before his procedure.  Patient's new concern is that the person who was going to bring him to the procedure is no longer available on the day he is scheduled.  I am going to leave things scheduled as is, but patient is going to talk to his friend and then call back to possibly find another day that will work for both of them.

## 2015-12-02 ENCOUNTER — Encounter: Payer: Self-pay | Admitting: *Deleted

## 2015-12-07 ENCOUNTER — Telehealth: Payer: Self-pay | Admitting: Internal Medicine

## 2015-12-07 ENCOUNTER — Encounter: Payer: Self-pay | Admitting: Internal Medicine

## 2015-12-08 ENCOUNTER — Encounter: Payer: Self-pay | Admitting: Internal Medicine

## 2015-12-08 ENCOUNTER — Ambulatory Visit (AMBULATORY_SURGERY_CENTER): Payer: PPO | Admitting: Internal Medicine

## 2015-12-08 ENCOUNTER — Telehealth: Payer: Self-pay

## 2015-12-08 VITALS — BP 136/71 | HR 76 | Temp 99.8°F | Resp 14 | Ht 74.0 in | Wt 199.0 lb

## 2015-12-08 DIAGNOSIS — N400 Enlarged prostate without lower urinary tract symptoms: Secondary | ICD-10-CM | POA: Diagnosis not present

## 2015-12-08 DIAGNOSIS — D124 Benign neoplasm of descending colon: Secondary | ICD-10-CM

## 2015-12-08 DIAGNOSIS — Z1211 Encounter for screening for malignant neoplasm of colon: Secondary | ICD-10-CM | POA: Diagnosis not present

## 2015-12-08 DIAGNOSIS — G4733 Obstructive sleep apnea (adult) (pediatric): Secondary | ICD-10-CM | POA: Diagnosis not present

## 2015-12-08 DIAGNOSIS — K635 Polyp of colon: Secondary | ICD-10-CM | POA: Diagnosis not present

## 2015-12-08 DIAGNOSIS — I10 Essential (primary) hypertension: Secondary | ICD-10-CM | POA: Diagnosis not present

## 2015-12-08 MED ORDER — SODIUM CHLORIDE 0.9 % IV SOLN: 500.0000 mL | INTRAVENOUS | Status: DC

## 2015-12-08 NOTE — Patient Instructions (Signed)
Discharge instructions given. Handouts on polyps and diverticulosis. Resume previous medications. YOU HAD AN ENDOSCOPIC PROCEDURE TODAY AT THE [facility]:   Refer to the procedure report that was given to you for any specific questions about what was found during the examination.  If the procedure report does not answer your questions, please call your gastroenterologist to clarify.  If you requested that your care partner not be given the details of your procedure findings, then the procedure report has been included in a sealed envelope for you to review at your convenience later.  YOU SHOULD EXPECT: Some feelings of bloating in the abdomen. Passage of more gas than usual.  Walking can help get rid of the air that was put into your GI tract during the procedure and reduce the bloating. If you had a lower endoscopy (such as a colonoscopy or flexible sigmoidoscopy) you may notice spotting of blood in your stool or on the toilet paper. If you underwent a bowel prep for your procedure, you may not have a normal bowel movement for a few days.  Please Note:  You might notice some irritation and congestion in your nose or some drainage.  This is from the oxygen used during your procedure.  There is no need for concern and it should clear up in a day or so.  SYMPTOMS TO REPORT IMMEDIATELY:   Following lower endoscopy (colonoscopy or flexible sigmoidoscopy):  Excessive amounts of blood in the stool  Significant tenderness or worsening of abdominal pains  Swelling of the abdomen that is new, acute  Fever of 100F or higher   For urgent or emergent issues, a gastroenterologist can be reached at any hour by calling [phone].   DIET: Your first meal following the procedure should be a small meal and then it is ok to progress to your normal diet. Heavy or fried foods are harder to digest and may make you feel nauseous or bloated.  Likewise, meals heavy in dairy and vegetables can  increase bloating.  Drink plenty of fluids but you should avoid alcoholic beverages for 24 hours.  ACTIVITY:  You should plan to take it easy for the rest of today and you should NOT DRIVE or use heavy machinery until tomorrow (because of the sedation medicines used during the test).    FOLLOW UP: Our staff will call the number listed on your records the next business day following your procedure to check on you and address any questions or concerns that you may have regarding the information given to you following your procedure. If we do not reach you, we will leave a message.  However, if you are feeling well and you are not experiencing any problems, there is no need to return our call.  We will assume that you have returned to your regular daily activities without incident.  If any biopsies were taken you will be contacted by phone or by letter within the next 1-3 weeks.  Please call us at [phone] if you have not heard about the biopsies in 3 weeks.    SIGNATURES/CONFIDENTIALITY: You and/or your care partner have signed paperwork which will be entered into your electronic medical record.  These signatures attest to the fact that that the information above on your After Visit Summary has been reviewed and is understood.  Full responsibility of the confidentiality of this discharge information lies with you and/or your care-partner. 

## 2015-12-08 NOTE — Op Note (Signed)
[facility] Patient Name: Willie Andrews Procedure Date: 12/08/2015 4:02 PM MRN: [id number] Endoscopist: Docia Chuck. Henrene Pastor , MD Age: 60 Referring MD:  Date of Birth: 1955/08/09 Gender: Male Procedure:                Colonoscopy with snare polypectomy -1 Indications:              Screening for colorectal malignant neoplasm Medicines:                Monitored Anesthesia Care Procedure:                Pre-Anesthesia Assessment:                           - Prior to the procedure, a History and Physical                            was performed, and patient medications and                            allergies were reviewed. The patient's tolerance of                            previous anesthesia was also reviewed. The risks                            and benefits of the procedure and the sedation                            options and risks were discussed with the patient.                            All questions were answered, and informed consent                            was obtained. Prior Anticoagulants: The patient has                            taken no previous anticoagulant or antiplatelet                            agents. ASA Grade Assessment: II - A patient with                            mild systemic disease. After reviewing the risks                            and benefits, the patient was deemed in                            satisfactory condition to undergo the procedure.                           After obtaining informed consent, the colonoscope  was passed under direct vision. Throughout the                            procedure, the patient's blood pressure, pulse, and                            oxygen saturations were monitored continuously. The                            Model CF-HQ190L [id number]) scope was introduced                            through the anus and advanced to the the cecum,                            identified by  appendiceal orifice and ileocecal                            valve. The ileocecal valve, appendiceal orifice,                            and rectum were photographed. The quality of the                            bowel preparation was excellent. The colonoscopy                            was performed without difficulty. The patient                            tolerated the procedure well. The bowel preparation                            used was SUPREP. Scope In: 4:17:50 PM Scope Out: 4:33:54 PM Scope Withdrawal Time: 0 hours 13 minutes 45 seconds  Total Procedure Duration: 0 hours 16 minutes 4 seconds  Findings:                 A 5 mm polyp was found in the descending colon. The                            polyp was removed with a cold snare. Resection and                            retrieval were complete.                           A few small-mouthed diverticula were found in the                            sigmoid colon. There was scarring in the region of                            the watershed region consistent with a  prior                            history of ischemic colitis remotely. There was                            mild melanosis coli throughout.                           The exam was otherwise without abnormality on                            direct and retroflexion views. Internal hemorrhoids                            present. Complications:            No immediate complications. Estimated blood loss:                            None. Estimated Blood Loss:     Estimated blood loss: none. Impression:               - One 5 mm polyp in the descending colon, removed                            with a cold snare. Resected and retrieved.                           - Diverticulosis in the sigmoid colon. Melanosis                            coli. Scarring from prior ischemic colitis.                           - The examination was otherwise normal on direct                             and retroflexion views. Recommendation:           - Repeat colonoscopy in 5-10 years for surveillance.                           - Resume previous diet.                           - Continue present medications.                           - Await pathology results. Docia Chuck. Henrene Pastor, MD 12/08/2015 4:41:07 PM This report has been signed electronically. CC Letter to:             Rosalita Chessman, MD

## 2015-12-08 NOTE — Progress Notes (Signed)
Called to room to assist during endoscopic procedure.  Patient ID and intended procedure confirmed with present staff. Received instructions for my participation in the procedure from the performing physician.  

## 2015-12-08 NOTE — Progress Notes (Signed)
To pacu vss patent aw report to rn 

## 2015-12-08 NOTE — Telephone Encounter (Signed)
A miscommunication between myself and the patient resulted in him not getting a sample prep in time to start for his procedure that is today at 4.  However, on his own, patient began clear liquids 2 days early and drank over 64 oz of gatorade mixed with 238 g bottle of Miralax.  He also took Ex [employer].  He claims that he was up all night using the bathroom and at this point his stools were almost clear.  I checked with Dr. Henrene Pastor who agreed that if patient came up to the office immediately we could give him a single bottle of Suprep for the second part of the prep.  Patient agreed to come get the bottle of Suprep as soon as possible and drink it as instructed originally.  I also reminded him to drink a lot of water after to make sure the prep worked as well as possible.

## 2015-12-09 ENCOUNTER — Telehealth: Payer: Self-pay | Admitting: *Deleted

## 2015-12-09 NOTE — Telephone Encounter (Signed)
  Follow up Call-  Call back number 12/08/2015  Post procedure Call Back phone  # [phone]  Permission to leave phone message Yes     Patient questions:  Do you have a fever, pain , or abdominal swelling? No. Pain Score  0 *  Have you tolerated food without any problems? Yes.    Have you been able to return to your normal activities? Yes.    Do you have any questions about your discharge instructions: Diet   No. Medications  No. Follow up visit  No.  Do you have questions or concerns about your Care? No.  Actions: * If pain score is 4 or above: No action needed, pain <4.

## 2015-12-14 ENCOUNTER — Encounter: Payer: Self-pay | Admitting: Internal Medicine

## 2015-12-15 DIAGNOSIS — Z6825 Body mass index (BMI) 25.0-25.9, adult: Secondary | ICD-10-CM | POA: Diagnosis not present

## 2015-12-15 DIAGNOSIS — M5412 Radiculopathy, cervical region: Secondary | ICD-10-CM | POA: Diagnosis not present

## 2015-12-17 ENCOUNTER — Other Ambulatory Visit: Payer: Self-pay | Admitting: Neurosurgery

## 2015-12-17 DIAGNOSIS — M5412 Radiculopathy, cervical region: Secondary | ICD-10-CM

## 2015-12-20 DIAGNOSIS — M1288 Other specific arthropathies, not elsewhere classified, other specified site: Secondary | ICD-10-CM | POA: Diagnosis not present

## 2015-12-20 DIAGNOSIS — M5417 Radiculopathy, lumbosacral region: Secondary | ICD-10-CM | POA: Diagnosis not present

## 2015-12-20 DIAGNOSIS — M542 Cervicalgia: Secondary | ICD-10-CM | POA: Diagnosis not present

## 2015-12-20 DIAGNOSIS — G479 Sleep disorder, unspecified: Secondary | ICD-10-CM | POA: Diagnosis not present

## 2015-12-28 ENCOUNTER — Ambulatory Visit
Admission: RE | Admit: 2015-12-28 | Discharge: 2015-12-28 | Disposition: A | Discharge status: Home / Self Care | Payer: PPO | Source: Ambulatory Visit | Attending: Neurosurgery | Admitting: Neurosurgery

## 2015-12-28 ENCOUNTER — Encounter: Payer: Self-pay | Admitting: Radiology

## 2015-12-28 VITALS — BP 140/71 | HR 77

## 2015-12-28 DIAGNOSIS — M5021 Other cervical disc displacement,  high cervical region: Secondary | ICD-10-CM | POA: Diagnosis not present

## 2015-12-28 DIAGNOSIS — M47812 Spondylosis without myelopathy or radiculopathy, cervical region: Secondary | ICD-10-CM

## 2015-12-28 DIAGNOSIS — M5412 Radiculopathy, cervical region: Secondary | ICD-10-CM

## 2015-12-28 MED ORDER — DIAZEPAM 5 MG PO TABS
10.0000 mg | ORAL_TABLET | Freq: Once | ORAL | Status: AC
  Administered 2015-12-28: 10 mg via ORAL

## 2015-12-28 MED ORDER — IOPAMIDOL (ISOVUE-300) INJECTION 61%
10.0000 mL | Freq: Once | INTRAVENOUS | Status: AC | PRN
  Administered 2015-12-28: 10 mL

## 2015-12-28 MED ORDER — HYDROMORPHONE HCL 2 MG/ML IJ SOLN
2.0000 mg | Freq: Once | INTRAMUSCULAR | Status: AC
  Administered 2015-12-28: 2 mg via INTRAMUSCULAR

## 2015-12-28 MED ORDER — HYDROMORPHONE HCL 2 MG PO TABS
2.0000 mg | ORAL_TABLET | Freq: Once | ORAL | Status: AC
  Administered 2015-12-28: 4 mg via ORAL

## 2015-12-28 MED ORDER — HYDROXYZINE HCL 50 MG/ML IM SOLN
25.0000 mg | Freq: Once | INTRAMUSCULAR | Status: AC
  Administered 2015-12-28: 25 mg via INTRAMUSCULAR

## 2015-12-28 NOTE — Progress Notes (Signed)
Pt states he has been off Wellbutrin for the past 2 days.

## 2015-12-28 NOTE — Discharge Instructions (Signed)
Myelogram Discharge Instructions  1. Go home and rest quietly for the next 24 hours.  It is important to lie flat for the next 24 hours.  Get up only to go to the restroom.  You may lie in the bed or on a couch on your back, your stomach, your left side or your right side.  You may have one pillow under your head.  You may have pillows between your knees while you are on your side or under your knees while you are on your back.  2. DO NOT drive today.  Recline the seat as far back as it will go, while still wearing your seat belt, on the way home.  3. You may get up to go to the bathroom as needed.  You may sit up for 10 minutes to eat.  You may resume your normal diet and medications unless otherwise indicated.  Drink lots of extra fluids today and tomorrow.  4. The incidence of headache, nausea, or vomiting is about 5% (one in 20 patients).  If you develop a headache, lie flat and drink plenty of fluids until the headache goes away.  Caffeinated beverages may be helpful.  If you develop severe nausea and vomiting or a headache that does not go away with flat bed rest, call [phone].  5. You may resume normal activities after your 24 hours of bed rest is over; however, do not exert yourself strongly or do any heavy lifting tomorrow. If when you get up you have a headache when standing, go back to bed and force fluids for another 24 hours.  6. Call your physician for a follow-up appointment.  The results of your myelogram will be sent directly to your physician by the following day.  7. If you have any questions or if complications develop after you arrive home, please call [phone].  Discharge instructions have been explained to the patient.  The patient, or the person responsible for the patient, fully understands these instructions.       May resume Wellbutrin on December 29, 2015, after 9:30 am.

## 2015-12-30 ENCOUNTER — Encounter (HOSPITAL_COMMUNITY): Payer: Self-pay | Admitting: Psychiatry

## 2015-12-30 ENCOUNTER — Ambulatory Visit (INDEPENDENT_AMBULATORY_CARE_PROVIDER_SITE_OTHER): Payer: PPO | Admitting: Psychiatry

## 2015-12-30 VITALS — BP 170/100 | HR 102 | Ht 74.0 in | Wt 201.0 lb

## 2015-12-30 DIAGNOSIS — F39 Unspecified mood [affective] disorder: Secondary | ICD-10-CM

## 2015-12-30 DIAGNOSIS — F330 Major depressive disorder, recurrent, mild: Secondary | ICD-10-CM | POA: Diagnosis not present

## 2015-12-30 MED ORDER — LAMOTRIGINE 200 MG PO TABS: 200.0000 mg | ORAL_TABLET | Freq: Every day | ORAL | Status: DC

## 2015-12-30 MED ORDER — DULOXETINE HCL 20 MG PO CPEP: 20.0000 mg | ORAL_CAPSULE | Freq: Every day | ORAL | Status: DC

## 2015-12-30 MED ORDER — BUPROPION HCL ER (XL) 300 MG PO TB24: ORAL_TABLET | ORAL | Status: DC
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
## 2016-01-03 DIAGNOSIS — M5417 Radiculopathy, lumbosacral region: Secondary | ICD-10-CM | POA: Diagnosis not present

## 2016-01-03 DIAGNOSIS — M542 Cervicalgia: Secondary | ICD-10-CM | POA: Diagnosis not present

## 2016-01-03 DIAGNOSIS — M1288 Other specific arthropathies, not elsewhere classified, other specified site: Secondary | ICD-10-CM | POA: Diagnosis not present

## 2016-01-03 DIAGNOSIS — G479 Sleep disorder, unspecified: Secondary | ICD-10-CM | POA: Diagnosis not present

## 2016-01-11 ENCOUNTER — Other Ambulatory Visit: Payer: Self-pay | Admitting: Family Medicine

## 2016-01-13 ENCOUNTER — Other Ambulatory Visit: Payer: Self-pay | Admitting: Family Medicine

## 2016-01-13 NOTE — Telephone Encounter (Signed)
Last seen 11/25/15 and filled 11/15/15 #60  Please advise    KP

## 2016-01-16 ENCOUNTER — Other Ambulatory Visit: Payer: Self-pay | Admitting: Family Medicine

## 2016-01-17 NOTE — Telephone Encounter (Signed)
Rx was not received on the 29 th, I called it in exactly the way it was prescribed. No refills.    KP

## 2016-01-19 ENCOUNTER — Telehealth: Payer: Self-pay | Admitting: Acute Care

## 2016-01-19 NOTE — Telephone Encounter (Signed)
I spoke with the patient just now and [place] is in the process of finding new Physicians in that area. I am cancelling this order and routing a message to Judson Roch about this decision

## 2016-01-21 ENCOUNTER — Encounter: Payer: Self-pay | Admitting: Family Medicine

## 2016-02-02 ENCOUNTER — Other Ambulatory Visit (HOSPITAL_COMMUNITY): Payer: Self-pay

## 2016-02-02 DIAGNOSIS — F330 Major depressive disorder, recurrent, mild: Secondary | ICD-10-CM

## 2016-02-02 DIAGNOSIS — F39 Unspecified mood [affective] disorder: Secondary | ICD-10-CM

## 2016-02-02 MED ORDER — DULOXETINE HCL 20 MG PO CPEP: 20.0000 mg | ORAL_CAPSULE | Freq: Every day | ORAL | Status: DC

## 2016-02-02 MED ORDER — LAMOTRIGINE 200 MG PO TABS: 200.0000 mg | ORAL_TABLET | Freq: Every day | ORAL | Status: DC

## 2016-02-02 MED ORDER — BUPROPION HCL ER (XL) 300 MG PO TB24: ORAL_TABLET | ORAL | Status: DC

## 2016-02-07 DIAGNOSIS — M5412 Radiculopathy, cervical region: Secondary | ICD-10-CM | POA: Diagnosis not present

## 2016-02-07 DIAGNOSIS — M5020 Other cervical disc displacement, unspecified cervical region: Secondary | ICD-10-CM | POA: Diagnosis not present

## 2016-02-07 DIAGNOSIS — M4802 Spinal stenosis, cervical region: Secondary | ICD-10-CM | POA: Diagnosis not present

## 2016-02-07 DIAGNOSIS — M542 Cervicalgia: Secondary | ICD-10-CM | POA: Diagnosis not present

## 2016-02-08 ENCOUNTER — Ambulatory Visit (HOSPITAL_COMMUNITY): Payer: Self-pay | Admitting: Psychiatry

## 2016-02-14 DIAGNOSIS — M5417 Radiculopathy, lumbosacral region: Secondary | ICD-10-CM | POA: Diagnosis not present

## 2016-02-14 DIAGNOSIS — M1288 Other specific arthropathies, not elsewhere classified, other specified site: Secondary | ICD-10-CM | POA: Diagnosis not present

## 2016-02-14 DIAGNOSIS — G479 Sleep disorder, unspecified: Secondary | ICD-10-CM | POA: Diagnosis not present

## 2016-02-14 DIAGNOSIS — M542 Cervicalgia: Secondary | ICD-10-CM | POA: Diagnosis not present

## 2016-02-23 ENCOUNTER — Other Ambulatory Visit: Payer: Self-pay | Admitting: Family Medicine

## 2016-02-24 NOTE — Telephone Encounter (Signed)
Last seen 11/25/15 and filled 01/17/16 #60   Please advise    KP

## 2016-02-28 ENCOUNTER — Telehealth: Payer: Self-pay | Admitting: Family Medicine

## 2016-02-28 NOTE — Telephone Encounter (Signed)
°  Relationship to patient: Self Can be reached:[phone] Pharmacy:  [facility] [address] [place] - [address] [phone] (Phone) [phone] (Fax)    Reason for call: Request refill on diazepam (VALIUM) 5 MG tablet [id number]

## 2016-02-28 NOTE — Telephone Encounter (Signed)
Approved on 02/24/16--Rx phoned in to the below pharmacy.    KP

## 2016-03-07 ENCOUNTER — Other Ambulatory Visit: Payer: Self-pay | Admitting: Neurosurgery

## 2016-03-07 ENCOUNTER — Other Ambulatory Visit (HOSPITAL_COMMUNITY): Payer: Self-pay | Admitting: Psychiatry

## 2016-03-07 DIAGNOSIS — F330 Major depressive disorder, recurrent, mild: Secondary | ICD-10-CM

## 2016-03-07 DIAGNOSIS — F39 Unspecified mood [affective] disorder: Secondary | ICD-10-CM

## 2016-03-10 ENCOUNTER — Other Ambulatory Visit (HOSPITAL_COMMUNITY): Payer: Self-pay

## 2016-03-10 DIAGNOSIS — F39 Unspecified mood [affective] disorder: Secondary | ICD-10-CM

## 2016-03-10 DIAGNOSIS — F330 Major depressive disorder, recurrent, mild: Secondary | ICD-10-CM

## 2016-03-10 MED ORDER — LAMOTRIGINE 200 MG PO TABS: 200.0000 mg | ORAL_TABLET | Freq: Every day | ORAL | 0 refills | Status: DC

## 2016-03-10 MED ORDER — BUPROPION HCL ER (XL) 300 MG PO TB24: ORAL_TABLET | ORAL | 0 refills | Status: DC

## 2016-03-10 MED ORDER — DULOXETINE HCL 20 MG PO CPEP: 20.0000 mg | ORAL_CAPSULE | Freq: Every day | ORAL | 0 refills | Status: DC

## 2016-03-10 NOTE — Progress Notes (Signed)
Patients pharmacy sent a fax for refills on patients medications, he has a follow up next month and refill was appropriate. I sent an order for one month on all of his prescriptions.

## 2016-03-20 DIAGNOSIS — S61041A Puncture wound with foreign body of right thumb without damage to nail, initial encounter: Secondary | ICD-10-CM | POA: Diagnosis not present

## 2016-03-24 ENCOUNTER — Other Ambulatory Visit (HOSPITAL_COMMUNITY): Payer: Self-pay

## 2016-03-24 DIAGNOSIS — G894 Chronic pain syndrome: Secondary | ICD-10-CM | POA: Diagnosis not present

## 2016-03-24 DIAGNOSIS — M791 Myalgia: Secondary | ICD-10-CM | POA: Diagnosis not present

## 2016-03-24 DIAGNOSIS — M5417 Radiculopathy, lumbosacral region: Secondary | ICD-10-CM | POA: Diagnosis not present

## 2016-03-24 DIAGNOSIS — F39 Unspecified mood [affective] disorder: Secondary | ICD-10-CM

## 2016-03-24 DIAGNOSIS — F330 Major depressive disorder, recurrent, mild: Secondary | ICD-10-CM

## 2016-03-24 DIAGNOSIS — K5903 Drug induced constipation: Secondary | ICD-10-CM | POA: Diagnosis not present

## 2016-03-24 DIAGNOSIS — M5416 Radiculopathy, lumbar region: Secondary | ICD-10-CM | POA: Diagnosis not present

## 2016-03-24 DIAGNOSIS — M47812 Spondylosis without myelopathy or radiculopathy, cervical region: Secondary | ICD-10-CM | POA: Diagnosis not present

## 2016-03-24 DIAGNOSIS — Z79891 Long term (current) use of opiate analgesic: Secondary | ICD-10-CM | POA: Diagnosis not present

## 2016-03-24 DIAGNOSIS — G4709 Other insomnia: Secondary | ICD-10-CM | POA: Diagnosis not present

## 2016-03-24 MED ORDER — BUPROPION HCL ER (XL) 300 MG PO TB24: ORAL_TABLET | ORAL | 0 refills | Status: AC

## 2016-03-24 MED ORDER — DULOXETINE HCL 20 MG PO CPEP: 20.0000 mg | ORAL_CAPSULE | Freq: Every day | ORAL | 0 refills | Status: AC

## 2016-03-24 MED ORDER — LAMOTRIGINE 200 MG PO TABS: 200.0000 mg | ORAL_TABLET | Freq: Every day | ORAL | 0 refills | Status: AC

## 2016-04-03 ENCOUNTER — Other Ambulatory Visit (HOSPITAL_COMMUNITY): Payer: Self-pay | Admitting: *Deleted

## 2016-04-03 ENCOUNTER — Encounter (HOSPITAL_COMMUNITY): Payer: Self-pay

## 2016-04-03 ENCOUNTER — Encounter (HOSPITAL_COMMUNITY)
Admission: RE | Admit: 2016-04-03 | Discharge: 2016-04-03 | Disposition: A | Discharge status: Home / Self Care | Payer: PPO | Source: Ambulatory Visit | Attending: Neurosurgery | Admitting: Neurosurgery

## 2016-04-03 HISTORY — DX: Pneumonia, unspecified organism: J18.9

## 2016-04-03 HISTORY — DX: Urinary calculus, unspecified: N20.9

## 2016-04-03 HISTORY — DX: Cardiac arrhythmia, unspecified: I49.9

## 2016-04-03 LAB — BASIC METABOLIC PANEL
ANION GAP: 8 (ref 5–15)
BUN: 13 mg/dL (ref 6–20)
CALCIUM: 9.7 mg/dL (ref 8.9–10.3)
CO2: 27 mmol/L (ref 22–32)
CREATININE: 1.19 mg/dL (ref 0.61–1.24)
Chloride: 105 mmol/L (ref 101–111)
GLUCOSE: 114 mg/dL — AB (ref 65–99)
Potassium: 4 mmol/L (ref 3.5–5.1)
Sodium: 140 mmol/L (ref 135–145)

## 2016-04-03 LAB — CBC
HEMATOCRIT: 47 % (ref 39.0–52.0)
HEMOGLOBIN: 15.8 g/dL (ref 13.0–17.0)
MCH: 28.6 pg (ref 26.0–34.0)
MCHC: 33.6 g/dL (ref 30.0–36.0)
MCV: 85 fL (ref 78.0–100.0)
Platelets: 227 10*3/uL (ref 150–400)
RBC: 5.53 MIL/uL (ref 4.22–5.81)
RDW: 13.6 % (ref 11.5–15.5)
WBC: 9.7 10*3/uL (ref 4.0–10.5)

## 2016-04-03 LAB — TYPE AND SCREEN
ABO/RH(D): O POS
ANTIBODY SCREEN: NEGATIVE

## 2016-04-03 LAB — SURGICAL PCR SCREEN
MRSA, PCR: NEGATIVE
Staphylococcus aureus: NEGATIVE

## 2016-04-03 LAB — ABO/RH: ABO/RH(D): O POS

## 2016-04-03 NOTE — Pre-Procedure Instructions (Addendum)
Willie Andrews  04/03/2016      [place], [place] - [address] [place] [address] Phone: [phone] Fax: [phone]  [place], [place] - [address] [place] [address] Phone: [phone] Fax: [facility] [address], [place] - [address] [place] [address] Phone: [phone] Fax: [phone]    Your procedure is scheduled on 04/04/16  Report to [facility] at 715 A.M.  Call this number if you have problems the morning of surgery:  [phone]   Remember:  Do not eat food or drink liquids after midnight.  Take these medicines the morning of surgery with A SIP OF WATER bupropion(wellbutrin),valium if needed ,duloxotine(cymbalta),finasteride(proscar),lamotrigine(lamictal), Metoprolol,protonix,flomax, pain med if needed  STOP all herbel meds, nsaids (aleve,naproxen,advil,ibuprofen) starting NOW including aspirin, multi vit   Do not wear jewelry, make-up or nail polish.  Do not wear lotions, powders, or perfumes, or deoderant.  Do not shave 48 hours prior to surgery.  Men may shave face and neck.  Do not bring valuables to the hospital.  [facility] is not responsible for any belongings or valuables.  Contacts, dentures or bridgework may not be worn into surgery.  Leave your suitcase in the car.  After surgery it may be brought to your room.  For patients admitted to the hospital, discharge time will be determined by your treatment team.  Patients discharged the day of surgery will not be allowed to drive home.   Name and phone number of your driver:    Special instructions:  Special Instructions: [facility] - Preparing for Surgery  Before surgery, you can play an important role.  Because skin is not sterile, your skin needs to be as free of germs as possible.  You can  reduce the number of germs on you skin by washing with CHG (chlorahexidine gluconate) soap before surgery.  CHG is an antiseptic cleaner which kills germs and bonds with the skin to continue killing germs even after washing.  Please DO NOT use if you have an allergy to CHG or antibacterial soaps.  If your skin becomes reddened/irritated stop using the CHG and inform your nurse when you arrive at Short Stay.  Do not shave (including legs and underarms) for at least 48 hours prior to the first CHG shower.  You may shave your face.  Please follow these instructions carefully:   1.  Shower with CHG Soap the night before surgery and the morning of Surgery.  2.  If you choose to wash your hair, wash your hair first as usual with your normal shampoo.  3.  After you shampoo, rinse your hair and body thoroughly to remove the Shampoo.  4.  Use CHG as you would any other liquid soap.  You can apply chg directly  to the skin and wash gently with scrungie or a clean washcloth.  5.  Apply the CHG Soap to your body ONLY FROM THE NECK DOWN.  Do not use on open wounds or open sores.  Avoid contact with your eyes ears, mouth and genitals (private parts).  Wash genitals (private parts)       with your normal soap.  6.  Wash thoroughly, paying special attention to the area where your surgery will be performed.  7.  Thoroughly rinse your body with warm water from the  neck down.  8.  DO NOT shower/wash with your normal soap after using and rinsing off the CHG Soap.  9.  Pat yourself dry with a clean towel.            10.  Wear clean pajamas.            11.  Place clean sheets on your bed the night of your first shower and do not sleep with pets.  Day of Surgery  Do not apply any lotions/deodorants the morning of surgery.  Please wear clean clothes to the hospital/surgery center.  Please read over the  fact sheets that you were given.

## 2016-04-04 ENCOUNTER — Inpatient Hospital Stay (HOSPITAL_COMMUNITY): Payer: PPO | Admitting: Anesthesiology

## 2016-04-04 ENCOUNTER — Inpatient Hospital Stay (HOSPITAL_COMMUNITY): Payer: PPO

## 2016-04-04 ENCOUNTER — Inpatient Hospital Stay (HOSPITAL_COMMUNITY)
Admission: RE | Admit: 2016-04-04 | Discharge: 2016-04-04 | DRG: 472 | Disposition: A | Discharge status: Home / Self Care | Payer: PPO | Source: Ambulatory Visit | Attending: Neurosurgery | Admitting: Neurosurgery

## 2016-04-04 ENCOUNTER — Encounter (HOSPITAL_COMMUNITY): Payer: Self-pay | Admitting: *Deleted

## 2016-04-04 ENCOUNTER — Encounter (HOSPITAL_COMMUNITY)
Admission: RE | Disposition: A | Discharge status: Home / Self Care | Payer: Self-pay | Source: Ambulatory Visit | Attending: Neurosurgery

## 2016-04-04 DIAGNOSIS — E785 Hyperlipidemia, unspecified: Secondary | ICD-10-CM | POA: Diagnosis not present

## 2016-04-04 DIAGNOSIS — Z981 Arthrodesis status: Secondary | ICD-10-CM | POA: Diagnosis not present

## 2016-04-04 DIAGNOSIS — M4802 Spinal stenosis, cervical region: Secondary | ICD-10-CM | POA: Diagnosis present

## 2016-04-04 DIAGNOSIS — M5412 Radiculopathy, cervical region: Secondary | ICD-10-CM | POA: Diagnosis not present

## 2016-04-04 DIAGNOSIS — Z79899 Other long term (current) drug therapy: Secondary | ICD-10-CM | POA: Diagnosis not present

## 2016-04-04 DIAGNOSIS — G9529 Other cord compression: Secondary | ICD-10-CM | POA: Diagnosis present

## 2016-04-04 DIAGNOSIS — M960 Pseudarthrosis after fusion or arthrodesis: Secondary | ICD-10-CM | POA: Diagnosis not present

## 2016-04-04 DIAGNOSIS — Z87891 Personal history of nicotine dependence: Secondary | ICD-10-CM

## 2016-04-04 DIAGNOSIS — Z885 Allergy status to narcotic agent status: Secondary | ICD-10-CM | POA: Diagnosis not present

## 2016-04-04 DIAGNOSIS — F319 Bipolar disorder, unspecified: Secondary | ICD-10-CM | POA: Diagnosis not present

## 2016-04-04 DIAGNOSIS — M542 Cervicalgia: Secondary | ICD-10-CM | POA: Diagnosis not present

## 2016-04-04 DIAGNOSIS — M1990 Unspecified osteoarthritis, unspecified site: Secondary | ICD-10-CM | POA: Diagnosis not present

## 2016-04-04 DIAGNOSIS — Z7982 Long term (current) use of aspirin: Secondary | ICD-10-CM

## 2016-04-04 DIAGNOSIS — G959 Disease of spinal cord, unspecified: Secondary | ICD-10-CM | POA: Diagnosis present

## 2016-04-04 DIAGNOSIS — M5001 Cervical disc disorder with myelopathy,  high cervical region: Secondary | ICD-10-CM | POA: Diagnosis not present

## 2016-04-04 DIAGNOSIS — M5011 Cervical disc disorder with radiculopathy,  high cervical region: Secondary | ICD-10-CM | POA: Diagnosis present

## 2016-04-04 DIAGNOSIS — Y838 Other surgical procedures as the cause of abnormal reaction of the patient, or of later complication, without mention of misadventure at the time of the procedure: Secondary | ICD-10-CM | POA: Diagnosis present

## 2016-04-04 DIAGNOSIS — M5020 Other cervical disc displacement, unspecified cervical region: Secondary | ICD-10-CM | POA: Diagnosis not present

## 2016-04-04 DIAGNOSIS — G8929 Other chronic pain: Secondary | ICD-10-CM | POA: Diagnosis not present

## 2016-04-04 DIAGNOSIS — I10 Essential (primary) hypertension: Secondary | ICD-10-CM | POA: Diagnosis present

## 2016-04-04 DIAGNOSIS — Z419 Encounter for procedure for purposes other than remedying health state, unspecified: Secondary | ICD-10-CM

## 2016-04-04 HISTORY — PX: ANTERIOR CERVICAL DECOMP/DISCECTOMY FUSION: SHX1161

## 2016-04-04 SURGERY — ANTERIOR CERVICAL DECOMPRESSION/DISCECTOMY FUSION 1 LEVEL
Anesthesia: General | Site: Spine Cervical

## 2016-04-04 MED ORDER — FINASTERIDE 5 MG PO TABS: 5.0000 mg | ORAL_TABLET | Freq: Every day | ORAL | Status: DC

## 2016-04-04 MED ORDER — FENTANYL CITRATE (PF) 100 MCG/2ML IJ SOLN
INTRAMUSCULAR | Status: AC
  Filled 2016-04-04: qty 2

## 2016-04-04 MED ORDER — ADULT MULTIVITAMIN W/MINERALS CH: 1.0000 | ORAL_TABLET | Freq: Every day | ORAL | Status: DC

## 2016-04-04 MED ORDER — DEXAMETHASONE SODIUM PHOSPHATE 10 MG/ML IJ SOLN
INTRAMUSCULAR | Status: DC | PRN
  Administered 2016-04-04: 10 mg via INTRAVENOUS

## 2016-04-04 MED ORDER — PROPOFOL 10 MG/ML IV BOLUS
INTRAVENOUS | Status: AC
  Filled 2016-04-04: qty 40

## 2016-04-04 MED ORDER — SODIUM CHLORIDE 0.9 % IV SOLN
0.2000 mg/kg/h | INTRAVENOUS | Status: DC
  Administered 2016-04-04: 4.25 mg/h via INTRAVENOUS
  Filled 2016-04-04: qty 2
  Filled 2016-04-04: qty 20

## 2016-04-04 MED ORDER — ROCURONIUM BROMIDE 10 MG/ML (PF) SYRINGE
PREFILLED_SYRINGE | INTRAVENOUS | Status: DC | PRN
  Administered 2016-04-04: 50 mg via INTRAVENOUS

## 2016-04-04 MED ORDER — FLEET ENEMA 7-19 GM/118ML RE ENEM: 1.0000 | ENEMA | Freq: Once | RECTAL | Status: DC | PRN

## 2016-04-04 MED ORDER — LIDOCAINE IN D5W 4-5 MG/ML-% IV SOLN
1.5000 ug/kg/min | INTRAVENOUS | Status: DC
  Administered 2016-04-04: 2.12 mg/min via INTRAVENOUS
  Filled 2016-04-04: qty 500

## 2016-04-04 MED ORDER — METOPROLOL TARTRATE 25 MG PO TABS
25.0000 mg | ORAL_TABLET | Freq: Two times a day (BID) | ORAL | Status: DC
Start: 2016-04-04 — End: 2016-04-04

## 2016-04-04 MED ORDER — ALUM & MAG HYDROXIDE-SIMETH 200-200-20 MG/5ML PO SUSP: 30.0000 mL | Freq: Four times a day (QID) | ORAL | Status: DC | PRN

## 2016-04-04 MED ORDER — BUPIVACAINE HCL (PF) 0.5 % IJ SOLN
INTRAMUSCULAR | Status: DC | PRN
  Administered 2016-04-04: 5 mL

## 2016-04-04 MED ORDER — FLUTICASONE PROPIONATE 50 MCG/ACT NA SUSP
2.0000 | Freq: Every day | NASAL | Status: DC
  Filled 2016-04-04: qty 16

## 2016-04-04 MED ORDER — FAMOTIDINE IN NACL 20-0.9 MG/50ML-% IV SOLN: 20.0000 mg | Freq: Two times a day (BID) | INTRAVENOUS | Status: DC

## 2016-04-04 MED ORDER — PHENYLEPHRINE HCL 10 MG/ML IJ SOLN
INTRAVENOUS | Status: DC | PRN
  Administered 2016-04-04: 25 ug/min via INTRAVENOUS

## 2016-04-04 MED ORDER — PROMETHAZINE HCL 25 MG/ML IJ SOLN: 6.2500 mg | INTRAMUSCULAR | Status: DC | PRN

## 2016-04-04 MED ORDER — ACETAMINOPHEN 325 MG PO TABS: 650.0000 mg | ORAL_TABLET | ORAL | Status: DC | PRN

## 2016-04-04 MED ORDER — PANTOPRAZOLE SODIUM 40 MG PO TBEC
40.0000 mg | DELAYED_RELEASE_TABLET | Freq: Every day | ORAL | Status: DC
  Administered 2016-04-04: 40 mg via ORAL
  Filled 2016-04-04: qty 1

## 2016-04-04 MED ORDER — FENTANYL CITRATE (PF) 100 MCG/2ML IJ SOLN
INTRAMUSCULAR | Status: DC | PRN
  Administered 2016-04-04 (×4): 50 ug via INTRAVENOUS

## 2016-04-04 MED ORDER — CHLORHEXIDINE GLUCONATE CLOTH 2 % EX PADS: 6.0000 | MEDICATED_PAD | Freq: Once | CUTANEOUS | Status: DC

## 2016-04-04 MED ORDER — HYDROMORPHONE HCL 1 MG/ML IJ SOLN: 0.5000 mg | INTRAMUSCULAR | Status: DC | PRN

## 2016-04-04 MED ORDER — MIDAZOLAM HCL 2 MG/2ML IJ SOLN
INTRAMUSCULAR | Status: AC
  Filled 2016-04-04: qty 2

## 2016-04-04 MED ORDER — OXYCODONE-ACETAMINOPHEN 5-325 MG PO TABS: 1.0000 | ORAL_TABLET | ORAL | Status: DC | PRN

## 2016-04-04 MED ORDER — METHOCARBAMOL 500 MG PO TABS
500.0000 mg | ORAL_TABLET | Freq: Four times a day (QID) | ORAL | Status: DC | PRN
  Administered 2016-04-04: 500 mg via ORAL
  Filled 2016-04-04: qty 1

## 2016-04-04 MED ORDER — ZOLPIDEM TARTRATE 5 MG PO TABS: 5.0000 mg | ORAL_TABLET | Freq: Every evening | ORAL | Status: DC | PRN

## 2016-04-04 MED ORDER — FENTANYL CITRATE (PF) 100 MCG/2ML IJ SOLN
25.0000 ug | INTRAMUSCULAR | Status: DC | PRN
  Administered 2016-04-04 (×2): 50 ug via INTRAVENOUS

## 2016-04-04 MED ORDER — LEVORPHANOL TARTRATE 2 MG PO TABS: 6.0000 mg | ORAL_TABLET | Freq: Three times a day (TID) | ORAL | Status: DC

## 2016-04-04 MED ORDER — OXYCODONE-ACETAMINOPHEN 10-325 MG PO TABS: 1.0000 | ORAL_TABLET | ORAL | Status: DC | PRN

## 2016-04-04 MED ORDER — HEMOSTATIC AGENTS (NO CHARGE) OPTIME
TOPICAL | Status: DC | PRN
  Administered 2016-04-04: 1 via TOPICAL

## 2016-04-04 MED ORDER — TAMSULOSIN HCL 0.4 MG PO CAPS
0.4000 mg | ORAL_CAPSULE | Freq: Every day | ORAL | Status: DC
  Administered 2016-04-04: 0.4 mg via ORAL
  Filled 2016-04-04: qty 1

## 2016-04-04 MED ORDER — SUCCINYLCHOLINE CHLORIDE 200 MG/10ML IV SOSY
PREFILLED_SYRINGE | INTRAVENOUS | Status: DC | PRN
  Administered 2016-04-04: 100 mg via INTRAVENOUS

## 2016-04-04 MED ORDER — THROMBIN 5000 UNITS EX SOLR
CUTANEOUS | Status: DC | PRN
  Administered 2016-04-04 (×2): 5000 [IU] via TOPICAL

## 2016-04-04 MED ORDER — PROPOFOL 10 MG/ML IV BOLUS
INTRAVENOUS | Status: DC | PRN
  Administered 2016-04-04: 50 mg via INTRAVENOUS
  Administered 2016-04-04: 15 mg via INTRAVENOUS

## 2016-04-04 MED ORDER — HYDROCODONE-ACETAMINOPHEN 5-325 MG PO TABS: 1.0000 | ORAL_TABLET | ORAL | Status: DC | PRN

## 2016-04-04 MED ORDER — LIDOCAINE-EPINEPHRINE 1 %-1:100000 IJ SOLN
INTRAMUSCULAR | Status: DC | PRN
  Administered 2016-04-04: 5 mL

## 2016-04-04 MED ORDER — DULOXETINE HCL 20 MG PO CPEP
20.0000 mg | ORAL_CAPSULE | Freq: Every day | ORAL | Status: DC
  Administered 2016-04-04: 20 mg via ORAL
  Filled 2016-04-04: qty 1

## 2016-04-04 MED ORDER — OXYCODONE-ACETAMINOPHEN 5-325 MG PO TABS
1.0000 | ORAL_TABLET | ORAL | Status: DC | PRN
  Administered 2016-04-04 (×2): 1 via ORAL
  Filled 2016-04-04 (×2): qty 1

## 2016-04-04 MED ORDER — OXYCODONE HCL 5 MG PO TABS
5.0000 mg | ORAL_TABLET | ORAL | Status: DC | PRN
  Administered 2016-04-04 (×2): 5 mg via ORAL
  Filled 2016-04-04 (×2): qty 1

## 2016-04-04 MED ORDER — BUPROPION HCL ER (XL) 300 MG PO TB24
300.0000 mg | ORAL_TABLET | Freq: Every day | ORAL | Status: DC
  Administered 2016-04-04: 300 mg via ORAL
  Filled 2016-04-04: qty 1

## 2016-04-04 MED ORDER — BISACODYL 10 MG RE SUPP: 10.0000 mg | Freq: Every day | RECTAL | Status: DC | PRN

## 2016-04-04 MED ORDER — LACTATED RINGERS IV SOLN
INTRAVENOUS | Status: DC
  Administered 2016-04-04 (×3): via INTRAVENOUS

## 2016-04-04 MED ORDER — MENTHOL 3 MG MT LOZG: 1.0000 | LOZENGE | OROMUCOSAL | Status: DC | PRN

## 2016-04-04 MED ORDER — DIAZEPAM 5 MG PO TABS
5.0000 mg | ORAL_TABLET | Freq: Four times a day (QID) | ORAL | Status: DC | PRN
  Administered 2016-04-04: 5 mg via ORAL
  Filled 2016-04-04: qty 1

## 2016-04-04 MED ORDER — LAMOTRIGINE 200 MG PO TABS
200.0000 mg | ORAL_TABLET | Freq: Every day | ORAL | Status: DC
  Administered 2016-04-04: 200 mg via ORAL
  Filled 2016-04-04: qty 1

## 2016-04-04 MED ORDER — MIDAZOLAM HCL 5 MG/5ML IJ SOLN
INTRAMUSCULAR | Status: DC | PRN
  Administered 2016-04-04: 2 mg via INTRAVENOUS

## 2016-04-04 MED ORDER — GELATIN ABSORBABLE MT POWD
OROMUCOSAL | Status: DC | PRN
  Administered 2016-04-04: 5 mL via TOPICAL

## 2016-04-04 MED ORDER — BENAZEPRIL HCL 20 MG PO TABS
20.0000 mg | ORAL_TABLET | Freq: Every day | ORAL | Status: DC
  Administered 2016-04-04: 20 mg via ORAL
  Filled 2016-04-04: qty 1

## 2016-04-04 MED ORDER — ASPIRIN EC 81 MG PO TBEC: 81.0000 mg | DELAYED_RELEASE_TABLET | Freq: Every day | ORAL | Status: DC

## 2016-04-04 MED ORDER — SENNOSIDES-DOCUSATE SODIUM 8.6-50 MG PO TABS: 1.0000 | ORAL_TABLET | Freq: Every evening | ORAL | Status: DC | PRN

## 2016-04-04 MED ORDER — ACETAMINOPHEN 650 MG RE SUPP: 650.0000 mg | RECTAL | Status: DC | PRN

## 2016-04-04 MED ORDER — DEXTROSE 5 % IV SOLN
500.0000 mg | Freq: Four times a day (QID) | INTRAVENOUS | Status: DC | PRN
  Filled 2016-04-04: qty 5

## 2016-04-04 MED ORDER — SUGAMMADEX SODIUM 200 MG/2ML IV SOLN
INTRAVENOUS | Status: DC | PRN
  Administered 2016-04-04: 200 mg via INTRAVENOUS

## 2016-04-04 MED ORDER — DOCUSATE SODIUM 100 MG PO CAPS: 100.0000 mg | ORAL_CAPSULE | Freq: Two times a day (BID) | ORAL | Status: DC

## 2016-04-04 MED ORDER — KCL IN DEXTROSE-NACL 20-5-0.45 MEQ/L-%-% IV SOLN: INTRAVENOUS | Status: DC

## 2016-04-04 MED ORDER — CEFAZOLIN SODIUM-DEXTROSE 2-4 GM/100ML-% IV SOLN
INTRAVENOUS | Status: AC
  Filled 2016-04-04: qty 100

## 2016-04-04 MED ORDER — PRAVASTATIN SODIUM 40 MG PO TABS
40.0000 mg | ORAL_TABLET | Freq: Every evening | ORAL | Status: DC
  Administered 2016-04-04: 40 mg via ORAL
  Filled 2016-04-04: qty 1

## 2016-04-04 MED ORDER — PHENOL 1.4 % MT LIQD: 1.0000 | OROMUCOSAL | Status: DC | PRN

## 2016-04-04 MED ORDER — SODIUM CHLORIDE 0.9% FLUSH: 3.0000 mL | Freq: Two times a day (BID) | INTRAVENOUS | Status: DC

## 2016-04-04 MED ORDER — SODIUM CHLORIDE 0.9% FLUSH: 3.0000 mL | INTRAVENOUS | Status: DC | PRN

## 2016-04-04 MED ORDER — ONDANSETRON HCL 4 MG/2ML IJ SOLN: 4.0000 mg | INTRAMUSCULAR | Status: DC | PRN

## 2016-04-04 MED ORDER — 0.9 % SODIUM CHLORIDE (POUR BTL) OPTIME
TOPICAL | Status: DC | PRN
  Administered 2016-04-04: 1000 mL

## 2016-04-04 MED ORDER — ONDANSETRON HCL 4 MG/2ML IJ SOLN
INTRAMUSCULAR | Status: DC | PRN
  Administered 2016-04-04: 4 mg via INTRAVENOUS

## 2016-04-04 MED ORDER — CEFAZOLIN SODIUM-DEXTROSE 2-4 GM/100ML-% IV SOLN
2.0000 g | INTRAVENOUS | Status: AC
  Administered 2016-04-04: 2 g via INTRAVENOUS

## 2016-04-04 MED ORDER — LIDOCAINE 2% (20 MG/ML) 5 ML SYRINGE
INTRAMUSCULAR | Status: DC | PRN
  Administered 2016-04-04: 100 mg via INTRAVENOUS

## 2016-04-04 MED ORDER — CEFAZOLIN IN D5W 1 GM/50ML IV SOLN: 1.0000 g | Freq: Three times a day (TID) | INTRAVENOUS | Status: DC

## 2016-04-04 MED ORDER — FENTANYL CITRATE (PF) 100 MCG/2ML IJ SOLN
INTRAMUSCULAR | Status: AC
  Filled 2016-04-04: qty 4

## 2016-04-04 SURGICAL SUPPLY — 67 items
ADH SKN CLS APL DERMABOND .7 (GAUZE/BANDAGES/DRESSINGS) ×1
BIT DRILL 12 (MISCELLANEOUS) ×2
BIT DRILL 12MM (MISCELLANEOUS) ×1
BIT DRILL 12X2.3XAVS ANCH-C (MISCELLANEOUS) ×1 IMPLANT
BIT DRILL NEURO 2X3.1 SFT TUCH (MISCELLANEOUS) ×1 IMPLANT
BIT DRL 12X2.3XAVS ANCH-C (MISCELLANEOUS) ×1
BLADE SURG 15 STRL LF DISP TIS (BLADE) IMPLANT
BLADE SURG 15 STRL SS (BLADE) ×3
BLADE ULTRA TIP 2M (BLADE) IMPLANT
BNDG GAUZE ELAST 4 BULKY (GAUZE/BANDAGES/DRESSINGS) ×4 IMPLANT
BRUSH SCRUB EZ PLAIN DRY (MISCELLANEOUS) ×3 IMPLANT
BUR BARREL STRAIGHT FLUTE 4.0 (BURR) ×3 IMPLANT
CAGE ANCHOR C 8X14X16X4 (Cage) ×1 IMPLANT
CANISTER SUCT 3000ML PPV (MISCELLANEOUS) ×3 IMPLANT
COVER MAYO STAND STRL (DRAPES) ×3 IMPLANT
DECANTER SPIKE VIAL GLASS SM (MISCELLANEOUS) ×3 IMPLANT
DERMABOND ADVANCED (GAUZE/BANDAGES/DRESSINGS) ×2
DERMABOND ADVANCED .7 DNX12 (GAUZE/BANDAGES/DRESSINGS) ×1 IMPLANT
DRAPE HALF SHEET 40X57 (DRAPES) IMPLANT
DRAPE LAPAROTOMY 100X72 PEDS (DRAPES) ×3 IMPLANT
DRAPE MICROSCOPE LEICA (MISCELLANEOUS) ×3 IMPLANT
DRAPE POUCH INSTRU U-SHP 10X18 (DRAPES) ×3 IMPLANT
DRILL NEURO 2X3.1 SOFT TOUCH (MISCELLANEOUS) ×3
DRSG OPSITE 4X5.5 SM (GAUZE/BANDAGES/DRESSINGS) ×2 IMPLANT
DURAPREP 6ML APPLICATOR 50/CS (WOUND CARE) ×3 IMPLANT
ELECT COATED BLADE 2.86 ST (ELECTRODE) ×3 IMPLANT
ELECT REM PT RETURN 9FT ADLT (ELECTROSURGICAL) ×3
ELECTRODE REM PT RTRN 9FT ADLT (ELECTROSURGICAL) ×1 IMPLANT
GAUZE SPONGE 4X4 12PLY STRL (GAUZE/BANDAGES/DRESSINGS) IMPLANT
GAUZE SPONGE 4X4 16PLY XRAY LF (GAUZE/BANDAGES/DRESSINGS) IMPLANT
GLOVE BIO SURGEON STRL SZ8 (GLOVE) ×3 IMPLANT
GLOVE BIOGEL PI IND STRL 8 (GLOVE) ×1 IMPLANT
GLOVE BIOGEL PI IND STRL 8.5 (GLOVE) ×2 IMPLANT
GLOVE BIOGEL PI INDICATOR 8 (GLOVE) ×2
GLOVE BIOGEL PI INDICATOR 8.5 (GLOVE) ×4
GLOVE ECLIPSE 8.0 STRL XLNG CF (GLOVE) ×3 IMPLANT
GLOVE EXAM NITRILE LRG STRL (GLOVE) IMPLANT
GLOVE EXAM NITRILE XL STR (GLOVE) IMPLANT
GLOVE EXAM NITRILE XS STR PU (GLOVE) IMPLANT
GOWN STRL REUS W/ TWL LRG LVL3 (GOWN DISPOSABLE) IMPLANT
GOWN STRL REUS W/ TWL XL LVL3 (GOWN DISPOSABLE) IMPLANT
GOWN STRL REUS W/TWL 2XL LVL3 (GOWN DISPOSABLE) IMPLANT
GOWN STRL REUS W/TWL LRG LVL3 (GOWN DISPOSABLE)
GOWN STRL REUS W/TWL XL LVL3 (GOWN DISPOSABLE)
HALTER HD/CHIN CERV TRACTION D (MISCELLANEOUS) ×3 IMPLANT
HEMOSTAT POWDER KIT SURGIFOAM (HEMOSTASIS) ×2 IMPLANT
KIT BASIN OR (CUSTOM PROCEDURE TRAY) ×3 IMPLANT
KIT ROOM TURNOVER OR (KITS) ×3 IMPLANT
NDL SPNL 22GX3.5 QUINCKE BK (NEEDLE) ×1 IMPLANT
NEEDLE HYPO 18GX1.5 BLUNT FILL (NEEDLE) IMPLANT
NEEDLE HYPO 25X1 1.5 SAFETY (NEEDLE) ×3 IMPLANT
NEEDLE SPNL 22GX3.5 QUINCKE BK (NEEDLE) ×3 IMPLANT
NS IRRIG 1000ML POUR BTL (IV SOLUTION) ×3 IMPLANT
PACK LAMINECTOMY NEURO (CUSTOM PROCEDURE TRAY) ×3 IMPLANT
PAD ARMBOARD 7.5X6 YLW CONV (MISCELLANEOUS) ×9 IMPLANT
PIN DISTRACTION 14MM (PIN) ×6 IMPLANT
RUBBERBAND STERILE (MISCELLANEOUS) ×6 IMPLANT
SCREW ST 3.5X14MM (Screw) ×4 IMPLANT
SPONGE INTESTINAL PEANUT (DISPOSABLE) ×3 IMPLANT
SPONGE SURGIFOAM ABS GEL SZ50 (HEMOSTASIS) ×3 IMPLANT
STAPLER SKIN PROX WIDE 3.9 (STAPLE) IMPLANT
SUT VIC AB 3-0 SH 8-18 (SUTURE) ×6 IMPLANT
SYR 3ML LL SCALE MARK (SYRINGE) IMPLANT
TOWEL OR 17X24 6PK STRL BLUE (TOWEL DISPOSABLE) ×3 IMPLANT
TOWEL OR 17X26 10 PK STRL BLUE (TOWEL DISPOSABLE) ×3 IMPLANT
TRAP SPECIMEN MUCOUS 40CC (MISCELLANEOUS) ×3 IMPLANT
WATER STERILE IRR 1000ML POUR (IV SOLUTION) ×3 IMPLANT

## 2016-04-04 NOTE — Op Note (Signed)
04/04/2016  12:43 PM  PATIENT:  Willie Andrews  60 y.o. male  PRE-OPERATIVE DIAGNOSIS:  Cervical herniated disc with myelopathy, cervical stenosis with cord compression, cervical radiculopathy C 34 level  POST-OPERATIVE DIAGNOSIS:   Cervical herniated disc with myelopathy, cervical stenosis with cord compression, cervical radiculopathy C 34 level  PROCEDURE:  Procedure(s) with comments: cervical Three-Four Anterior cervical decompression/diskectomy/fusion/;with exploration of Cervical Four-Seven (N/A) - right side approach with Anchor C  Cervical PEEK cage with plate and screws  SURGEON:  Surgeon(s) and Role:    * Erline Levine, MD - Primary    * Kristeen Miss, MD - Assisting  PHYSICIAN ASSISTANT:   ASSISTANTS: [place], RN   ANESTHESIA:   general  EBL:  Total I/O In: 1000 [I.V.:1000] Out: 61 [Blood:50]  BLOOD ADMINISTERED:none  DRAINS: none   LOCAL MEDICATIONS USED:  MARCAINE    and LIDOCAINE   SPECIMEN:  No Specimen  DISPOSITION OF SPECIMEN:  N/A  COUNTS:  YES  TOURNIQUET:  * No tourniquets in log *  DICTATION: DICTATION: Patient is 60 year old male with herniation of C3 on 4 with stenosis, myelopathy, cord compression, HNP.  It was elected to take him to surgery for anterior cervical decompression and fusion C 34 level with exploration of C 4 through C 7 levels.  PROCEDURE: Patient was brought to operating room and following the smooth and uncomplicated induction of general endotracheal anesthesia his head was placed on a horseshoe head holder he was placed in 5 pounds of Holter traction and his anterior neck was prepped and draped in usual sterile fashion. An incision was made on the right side of midline after infiltrating the skin and subcutaneous tissues with local lidocaine through previous incision. The platysmal layer was incised and subplatysmal dissection was performed exposing the anterior border sternocleidomastoid muscle. Using blunt dissection the carotid sheath  was kept lateral and trachea and esophagus kept medial exposing the anterior cervical spine. The previously placed plate was exposed from investing scar tissue at its cephalad most portion.   Longus coli muscles were taken down from the anterior cervical spine using electrocautery and key elevator and self-retaining retractor was placed exposing the C 34 level. The interspace was incised and a thorough discectomy was performed. Distraction pins were placed. Uncinate spurs and central spondylitic ridges were drilled down with a high-speed drill. The spinal cord dura and both C4 nerve roots were widely decompressed with removal of right uncinate process was removed. Hemostasis was assured. After trial sizing an 8 mm lordotic PEEK cage was packed was selected and packed with local autograft. This was tamped into position and countersunk appropriately. 14 mm x 3.5 mm screws were placed, one in C 3 and one in C4 through the plate affixed to the cage Research scientist (medical) C implant).  All screws were well-positioned and locking mechanisms were engaged. A final X ray was obtained which showed well positioned graft and anterior plate without complicating features. Soft tissues were inspected and found to be in good repair. The wound was irrigated. The platysma layer was closed with 3-0 Vicryl stitches and the skin was reapproximated with 3-0 Vicryl subcuticular stitches. The wound was dressed with Dermabond. Counts were correct at the end of the case. Patient was extubated and taken to recovery in stable and satisfactory condition.   PLAN OF CARE: Admit to inpatient   PATIENT DISPOSITION:  PACU - hemodynamically stable.   Delay start of Pharmacological VTE agent (>24hrs) due to surgical blood loss or  risk of bleeding: yes

## 2016-04-04 NOTE — Anesthesia Preprocedure Evaluation (Addendum)
Anesthesia Evaluation  Patient identified by MRN, date of birth, ID band Patient awake    Reviewed: Allergy & Precautions, NPO status , Patient's Chart, lab work & pertinent test results, reviewed documented beta blocker date and time   History of Anesthesia Complications Negative for: history of anesthetic complications  Airway Mallampati: III  TM Distance: >3 FB Neck ROM: Limited    Dental  (+) Partial Lower, Partial Upper, Dental Advisory Given   Pulmonary neg shortness of breath, sleep apnea (does not use CPAP) , neg COPD, neg recent URI, former smoker,    Pulmonary exam normal breath sounds clear to auscultation       Cardiovascular hypertension, Pt. on medications and Pt. on home beta blockers (-) angina(-) Past MI, (-) Cardiac Stents and (-) Orthopnea + dysrhythmias (RBBB) + Valvular Problems/Murmurs (mild) AI and MR  Rhythm:Regular Rate:Normal  HLD  TTE 12/08/2014: Study Conclusions  - Left ventricle: The cavity size was normal. Wall thickness was   normal. Systolic function was normal. The estimated ejection   fraction was in the range of 55% to 60%. - Aortic valve: There was mild regurgitation. - Mitral valve: There was mild regurgitation. - Left atrium: The atrium was mildly dilated. - Atrial septum: No defect or patent foramen ovale was identified.   Neuro/Psych  Headaches, neg Seizures PSYCHIATRIC DISORDERS Depression Bipolar Disorder H/o C4-C7 ACDF. Patient has a spinal cord stimulator, placed in 2013. Baseline pain is 4/10.  Neuromuscular disease (radiculopathy)    GI/Hepatic Neg liver ROS, neg GERD  ,H/o ischemic colitis   Endo/Other  negative endocrine ROS  Renal/GU negative Renal ROS     Musculoskeletal  (+) Arthritis ,   Abdominal   Peds  Hematology negative hematology ROS (+)   Anesthesia Other Findings   Reproductive/Obstetrics                         Anesthesia  Physical Anesthesia Plan  ASA: III  Anesthesia Plan: General   Post-op Pain Management:    Induction: Intravenous  Airway Management Planned: Oral ETT and Video Laryngoscope Planned  Additional Equipment:   Intra-op Plan:   Post-operative Plan: Extubation in OR  Informed Consent: I have reviewed the patients History and Physical, chart, labs and discussed the procedure including the risks, benefits and alternatives for the proposed anesthesia with the patient or authorized representative who has indicated his/her understanding and acceptance.   Dental advisory given  Plan Discussed with: CRNA  Anesthesia Plan Comments: (Risks of general anesthesia discussed including, but not limited to, sore throat, hoarse voice, chipped/damaged teeth, injury to vocal cords, nausea and vomiting, allergic reactions, lung infection, heart attack, stroke, and death. All questions answered. )       Anesthesia Quick Evaluation

## 2016-04-04 NOTE — Brief Op Note (Signed)
04/04/2016  12:43 PM  PATIENT:  Willie Andrews  60 y.o. male  PRE-OPERATIVE DIAGNOSIS:  Cervical herniated disc with myelopathy, cervical stenosis with cord compression, cervical radiculopathy C 34 level  POST-OPERATIVE DIAGNOSIS:   Cervical herniated disc with myelopathy, cervical stenosis with cord compression, cervical radiculopathy C 34 level  PROCEDURE:  Procedure(s) with comments: cervical Three-Four Anterior cervical decompression/diskectomy/fusion/;with exploration of Cervical Four-Seven (N/A) - right side approach with Anchor C  Cervical PEEK cage with plate and screws  SURGEON:  Surgeon(s) and Role:    * Erline Levine, MD - Primary    * Kristeen Miss, MD - Assisting  PHYSICIAN ASSISTANT:   ASSISTANTS: Poteat, RN   ANESTHESIA:   general  EBL:  Total I/O In: 1000 [I.V.:1000] Out: 91 [Blood:50]  BLOOD ADMINISTERED:none  DRAINS: none   LOCAL MEDICATIONS USED:  MARCAINE    and LIDOCAINE   SPECIMEN:  No Specimen  DISPOSITION OF SPECIMEN:  N/A  COUNTS:  YES  TOURNIQUET:  * No tourniquets in log *  DICTATION: DICTATION: Patient is 60 year old male with herniation of C3 on 4 with stenosis, myelopathy, cord compression, HNP.  It was elected to take him to surgery for anterior cervical decompression and fusion C 34 level with exploration of C 4 through C 7 levels.  PROCEDURE: Patient was brought to operating room and following the smooth and uncomplicated induction of general endotracheal anesthesia his head was placed on a horseshoe head holder he was placed in 5 pounds of Holter traction and his anterior neck was prepped and draped in usual sterile fashion. An incision was made on the right side of midline after infiltrating the skin and subcutaneous tissues with local lidocaine through previous incision. The platysmal layer was incised and subplatysmal dissection was performed exposing the anterior border sternocleidomastoid muscle. Using blunt dissection the carotid sheath  was kept lateral and trachea and esophagus kept medial exposing the anterior cervical spine. The previously placed plate was exposed from investing scar tissue at its cephalad most portion.   Longus coli muscles were taken down from the anterior cervical spine using electrocautery and key elevator and self-retaining retractor was placed exposing the C 34 level. The interspace was incised and a thorough discectomy was performed. Distraction pins were placed. Uncinate spurs and central spondylitic ridges were drilled down with a high-speed drill. The spinal cord dura and both C4 nerve roots were widely decompressed with removal of right uncinate process was removed. Hemostasis was assured. After trial sizing an 8 mm lordotic PEEK cage was packed was selected and packed with local autograft. This was tamped into position and countersunk appropriately. 14 mm x 3.5 mm screws were placed, one in C 3 and one in C4 through the plate affixed to the cage Research scientist (medical) C implant).  All screws were well-positioned and locking mechanisms were engaged. A final X ray was obtained which showed well positioned graft and anterior plate without complicating features. Soft tissues were inspected and found to be in good repair. The wound was irrigated. The platysma layer was closed with 3-0 Vicryl stitches and the skin was reapproximated with 3-0 Vicryl subcuticular stitches. The wound was dressed with Dermabond. Counts were correct at the end of the case. Patient was extubated and taken to recovery in stable and satisfactory condition.   PLAN OF CARE: Admit to inpatient   PATIENT DISPOSITION:  PACU - hemodynamically stable.   Delay start of Pharmacological VTE agent (>24hrs) due to surgical blood loss or  risk of bleeding: yes

## 2016-04-04 NOTE — Progress Notes (Signed)
Awake, alert, conversant.  MAEW with good strength, right hand numbness improved.  Doing well.

## 2016-04-04 NOTE — Interval H&P Note (Signed)
History and Physical Interval Note:  04/04/2016 7:26 AM  Willie Andrews  has presented today for surgery, with the diagnosis of Cervical herniated disc  The various methods of treatment have been discussed with the patient and family. After consideration of risks, benefits and other options for treatment, the patient has consented to  Procedure(s) with comments: C3-4 Anterior cervical decompression/diskectomy/fusion/;with exploration of C4-C7 (N/A) - C3-4 Anterior cervical decompression/diskectomy/fusion/;with exploration of C4-C7 as a surgical intervention .  The patient's history has been reviewed, patient examined, no change in status, stable for surgery.  I have reviewed the patient's chart and labs.  Questions were answered to the patient's satisfaction.     Daphane Odekirk D

## 2016-04-04 NOTE — Anesthesia Procedure Notes (Signed)
Procedure Name: Intubation Date/Time: 04/04/2016 10:43 AM Performed by: Everlean Cherry A Pre-anesthesia Checklist: Patient identified, Emergency Drugs available, Suction available and Patient being monitored Patient Re-evaluated:Patient Re-evaluated prior to inductionOxygen Delivery Method: Circle system utilized Preoxygenation: Pre-oxygenation with 100% oxygen Intubation Type: IV induction Ventilation: Mask ventilation without difficulty and Oral airway inserted - appropriate to patient size Laryngoscope Size: Glidescope and 4 Grade View: Grade I Tube type: Oral Tube size: 7.5 mm Airway Equipment and Method: Video-laryngoscopy and Rigid stylet Placement Confirmation: ETT inserted through vocal cords under direct vision,  positive ETCO2 and breath sounds checked- equal and bilateral Secured at: 23 cm Tube secured with: Tape Dental Injury: Teeth and Oropharynx as per pre-operative assessment

## 2016-04-04 NOTE — Evaluation (Signed)
Physical Therapy Evaluation Patient Details Name: Willie Andrews MRN: [id number] DOB: August 30, 1955 Today's Date: 04/04/2016   History of Present Illness  patient is a 60 yo male s/p cervical Three-Four Anterior cervical decompression/diskectomy/fusion/;with exploration of Cervical Four-Seven  Clinical Impression  Patient seen for education and mobility assessment s/p cervical surgery. Patient mobilizing well ambulated increased distance without difficulty, educated regarding compliance with precautions. Patient verbalizes understanding (has had previous surgeries). No further acute PT needs. Will sign off.     Follow Up Recommendations No PT follow up    Equipment Recommendations  3in1 (PT)    Recommendations for Other Services       Precautions / Restrictions Precautions Precautions: Cervical Precaution Booklet Issued: Yes (comment) Precaution Comments: verbally reviewed with patient Required Braces or Orthoses: Cervical Brace      Mobility  Bed Mobility Overal bed mobility: Independent                Transfers Overall transfer level: Independent                  Ambulation/Gait Ambulation/Gait assistance: Independent Ambulation Distance (Feet): 380 Feet Assistive device: None (pushing IV pole) Gait Pattern/deviations: WFL(Within Functional Limits)   Gait velocity interpretation: at or above normal speed for age/gender General Gait Details: steady with mobility  Stairs            Wheelchair Mobility    Modified Rankin (Stroke Patients Only)       Balance Overall balance assessment: No apparent balance deficits (not formally assessed)                                           Pertinent Vitals/Pain Pain Assessment: 0-10 Pain Score: 7  Pain Location: bilateral calves and neck Pain Descriptors / Indicators: Sore Pain Intervention(s): Monitored during session    Home Living Family/patient expects to be discharged to::  Private residence Living Arrangements: Non-relatives/Friends Available Help at Discharge: Friend(s) Type of Home: Apartment Home Access: Level entry     Home Layout: One level Home Equipment: Adaptive equipment      Prior Function Level of Independence: Independent               Hand Dominance   Dominant Hand: Right    Extremity/Trunk Assessment   Upper Extremity Assessment: Overall WFL for tasks assessed           Lower Extremity Assessment: Overall WFL for tasks assessed         Communication   Communication: No difficulties  Cognition Arousal/Alertness: Awake/alert Behavior During Therapy: WFL for tasks assessed/performed Overall Cognitive Status: Within Functional Limits for tasks assessed                      General Comments      Exercises     Assessment/Plan    PT Assessment Patent does not need any further PT services  PT Problem List            PT Treatment Interventions      PT Goals (Current goals can be found in the Care Plan section)  Acute Rehab PT Goals PT Goal Formulation: All assessment and education complete, DC therapy    Frequency     Barriers to discharge        Co-evaluation  End of Session Equipment Utilized During Treatment: Cervical collar Activity Tolerance: Patient tolerated treatment well Patient left: in bed;with family/visitor present Nurse Communication: Mobility status    Functional Assessment Tool Used: clinical judgement Functional Limitation: Mobility: Walking and moving around Mobility: Walking and Moving Around Current Status [phone]): 0 percent impaired, limited or restricted Mobility: Walking and Moving Around Goal Status [phone]): 0 percent impaired, limited or restricted Mobility: Walking and Moving Around Discharge Status [phone]): 0 percent impaired, limited or restricted    Time: [id number] PT Time Calculation (min) (ACUTE ONLY): 11 min   Charges:   PT  Evaluation $PT Eval Low Complexity: 1 Procedure     PT G Codes:   PT G-Codes **NOT FOR INPATIENT CLASS** Functional Assessment Tool Used: clinical judgement Functional Limitation: Mobility: Walking and moving around Mobility: Walking and Moving Around Current Status [id number]): 0 percent impaired, limited or restricted Mobility: Walking and Moving Around Goal Status [phone]): 0 percent impaired, limited or restricted Mobility: Walking and Moving Around Discharge Status [phone]): 0 percent impaired, limited or restricted    Duncan Dull 04/04/2016, 4:22 PM Alben Deeds, [place] DPT  [phone]

## 2016-04-04 NOTE — Transfer of Care (Signed)
Immediate Anesthesia Transfer of Care Note  Patient: STELIOS MCGHIE  Procedure(s) Performed: Procedure(s) with comments: cervical Three-Four Anterior cervical decompression/diskectomy/fusion/;with exploration of Cervical Four-Seven (N/A) - right side approach  Patient Location: PACU  Anesthesia Type:General  Level of Consciousness: awake, alert , oriented and patient cooperative  Airway & Oxygen Therapy: Patient Spontanous Breathing and Patient connected to nasal cannula oxygen  Post-op Assessment: Report given to RN, Post -op Vital signs reviewed and stable and Patient moving all extremities X 4  Post vital signs: Reviewed and stable  Last Vitals:  Vitals:   04/04/16 0903 04/04/16 1255  BP: 135/82 (!) (P) 161/86  Pulse: (!) 50   Resp: 20   Temp: 36.2 C (P) 36.4 C    Last Pain:  Vitals:   04/04/16 0903  TempSrc: Oral  PainSc:       Patients Stated Pain Goal: 4 [phone]  Complications: No apparent anesthesia complications

## 2016-04-04 NOTE — H&P (Signed)
Patient ID:   [id number] Patient: Willie Andrews  Date of Birth: February 26, 1956 Visit Type: Office Visit   Date: 02/07/2016 03:00 PM Provider: Marchia Meiers. Vertell Limber MD   This 60 year old male presents for Neck pain.  History of Present Illness: 1.  Neck pain    The patient is an internal referral from Dr. Hal Neer and comes in for review of his CT myelogram.  12/28/15 CT myelogram: Status post C4-7 ACDF. 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 Adjacent segment disease at C3-4 with a right sided disc osteophyte complex, severe asymmetric right sided facet arthropathy uncinate spurring, and soft disc protrusion resulting in stenosis, right sided cord flattening, and right greater than left C4 nerve root impingement. Residual foraminal narrowing due to bony overgrowth at C5-6 bilaterally and C6-7, right greater than left.      Medical/Surgical/Interim History Reviewed, no change.  Last detailed document date:10/22/2015.   PAST MEDICAL HISTORY, SURGICAL HISTORY, FAMILY HISTORY, SOCIAL HISTORY AND REVIEW OF SYSTEMS  10/22/2015, which I have signed.  Family History: Reviewed, no changes.  Last detailed document: 10/22/2015.   Social History: Tobacco use reviewed. Reviewed, no changes. Last detailed document date: 10/22/2015.      MEDICATIONS(added, continued or stopped this visit): Started Medication Directions Instruction Stopped   Aspir-81 81 mg tablet,delayed release take 1 tablet by oral route  every day     benazepril 20 mg tablet take 1 tablet by oral route  every day     bupropion HCl XL 300 mg 24 hr tablet, extended release take 1 tablet by oral route  every day     clomipramine 50 mg capsule take 2 capsule by oral route  every day at bedtime     diazepam 5 mg tablet take 1 tablet by oral route 3 times every day     finasteride 5 mg tablet take 1 tablet by oral route  every day     Flonase Allergy Relief 50 mcg/actuation nasal spray,suspension spray 1 - 2 spray by intranasal route  every  day in each nostril as needed     Jublia 10 % topical solution with applicator apply by topical route  every day to affected toenail(s)     lamotrigine 200 mg tablet take 1 tablet by oral route 2 times every day     methadone 10 mg tablet take 3 tablet by oral route  every 8 hours     metoprolol succinate ER 25 mg tablet,extended release 24 hr take 1 tablet by oral route  every day    11/11/2015 nabumetone 500 mg tablet take 1 tablet by oral route 2 times every day     oxycodone-acetaminophen 10 mg-325 mg tablet take 1 tablet by oral route  every 6 hours as needed     pantoprazole 40 mg tablet,delayed release take 1 tablet by oral route  every day     pravastatin 40 mg tablet take 1 tablet by oral route  every day     Seroquel 100 mg tablet take 1 tablet by oral route 2 times every day    10/22/2015 Sterapred 10 day pack 10mg  ORAL take TWELVE day pack as directed     tamsulosin 0.4 mg capsule take 1 capsule by oral route  every day 1/2 hour following the same meal each day       ALLERGIES: Ingredient Reaction Medication Name Comment  MORPHINE     OXYMORPHONE HCL  Opana   KETOROLAC TROMETHAMINE  Toradol  Vitals Date Temp F BP Pulse Ht In Wt Lb BMI BSA Pain Score  02/07/2016  145/78 73 74 193.4 24.83  7/10     PHYSICAL EXAM General Level of Distress: no acute distress Overall Appearance: normal    Cardiovascular Cardiac: regular rate and rhythm without murmur  Right Left  Carotid Pulses: normal normal  Respiratory Lungs: clear to auscultation  Neurological Orientation: normal Recent and Remote Memory: normal Attention Span and Concentration:   normal Language: normal Fund of Knowledge: normal  Right Left Sensation: normal normal Upper Extremity Coordination: normal normal  Lower Extremity Coordination: normal normal  Musculoskeletal Gait and Station: normal  Right Left Upper Extremity Muscle Strength: normal normal Lower Extremity Muscle  Strength: normal normal Upper Extremity Muscle Tone:  normal normal Lower Extremity Muscle Tone: normal normal  Motor Strength Grip 4/5 on the left     Deep Tendon Reflexes  Right Left Biceps: normal normal Triceps: normal normal Brachiloradialis: normal normal Patellar: normal normal Achilles: normal normal  Sensory Sensation was tested at C2 to T1.   Cranial Nerves II. Optic Nerve/Visual Fields: normal III. Oculomotor: normal IV. Trochlear: normal V. Trigeminal: normal VI. Abducens: normal VII. Facial: normal VIII. Acoustic/Vestibular: normal IX. Glossopharyngeal: normal X. Vagus: normal XI. Spinal Accessory: normal XII. Hypoglossal: normal  Motor and other Tests Lhermittes: negative Rhomberg: negative Pronator drift: absent     Right Left Spurlings negative negative Hoffman's: normal normal Clonus: normal normal Babinski: normal normal SLR: negative negative Patrick's Corky Sox): negative negative Toe Walk: normal normal Toe Lift: normal normal Heel Walk: normal normal Tinels Elbow: negative negative Tinels Wrist: negative negative Phalen: negative negative     DIAGNOSTIC RESULTS 12/28/15 CT myelogram: Status post C4-7 ACDF. 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 Adjacent segment disease at C3-4 with a right sided disc osteophyte complex, severe asymmetric right sided facet arthropathy uncinate spurring, and soft disc protrusion resulting in stenosis, right sided cord flattening, and right greater than left C4 nerve root impingement. Residual foraminal narrowing due to bony overgrowth at C5-6 bilaterally and C6-7, right greater than left.    IMPRESSION The patient's CT reveals right sided C3-4 disc herniation, spurring, and facet arthritis. The disc herniation is causing spinal stenosis and right greater than left C4 nerve root impingement. He also has a pseudoarthrosis at C5-6 and C6-7. On confrontational testing, he has weakness in his left hand intrinsics,  but this is from an old upper extremity injury. I recommend a C3-4 ACDF for alleviation of his neck and right arm symptoms.    Pain Assessment/Treatment Pain Scale: 7/10. Method: Numeric Pain Intensity Scale. Location: back. Onset: 09/17/2015. Duration: varies. Quality: discomforting. Pain Assessment/Treatment follow-up plan of care: Patient taking medication as prescribed..  Fall Risk Plan The patient has not fallen in the last year.  Schedule C3-4 ACDF and exploration of fusion with possible removal of previously placed hardware. Nurse education given. Fitted for soft cervical collar.              Provider:  Marchia Meiers. Vertell Limber MD  02/07/2016 03:51 PM Dictation edited by: Johnella Moloney    CC Providers: Garnet Koyanagi [place] [address] [place], [place] [address]-              Electronically signed by Marchia Meiers. Vertell Limber MD on 02/07/2016 06:36 PM  on behalf of Earleen Newport MD

## 2016-04-04 NOTE — Progress Notes (Signed)
Patient alert and oriented, mae's well, voiding adequate amount of urine, swallowing without difficulty, c/o moderate pain and meds given prior to discharged for ride and discomfort. Patient discharged home with family. Script and discharged instructions given to patient. Patient and family stated understanding of instructions given.   

## 2016-04-04 NOTE — Anesthesia Postprocedure Evaluation (Signed)
Anesthesia Post Note  Patient: Willie Andrews  Procedure(s) Performed: Procedure(s) (LRB): cervical Three-Four Anterior cervical decompression/diskectomy/fusion/;with exploration of Cervical Four-Seven (N/A)  Patient location during evaluation: PACU Anesthesia Type: General Level of consciousness: awake and alert Pain management: pain level controlled Vital Signs Assessment: post-procedure vital signs reviewed and stable Respiratory status: spontaneous breathing, nonlabored ventilation and respiratory function stable Cardiovascular status: blood pressure returned to baseline and stable Postop Assessment: no signs of nausea or vomiting Anesthetic complications: no    Last Vitals:  Vitals:   04/04/16 1400 04/04/16 1413  BP:    Pulse: 89 84  Resp: (!) 24 14  Temp:  36.7 C    Last Pain:  Vitals:   04/04/16 1344  TempSrc:   PainSc: 6                  Nilda Simmer

## 2016-04-05 NOTE — Discharge Summary (Signed)
Physician Discharge Summary  Patient ID: Willie Andrews MRN: [id number] DOB/AGE: Jun 08, 1956 60 y.o.  Admit date: 04/04/2016 Discharge date: 04/05/2016  Admission Diagnoses:Herniated cervical disc with myelopathy, stenosis, cord compression, radiculopathy, spondylosis C 34 level  Discharge Diagnoses: Herniated cervical disc with myelopathy, stenosis, cord compression, radiculopathy, spondylosis C 34 level  Active Problems:   Cervical myelopathy ([place])   Discharged Condition: good  Hospital Course: Patient underwent uncomplicated anterior cervical decompression and fusion C 34 level with good relief of his arm pain and numbness.  He was discharged home the afternoon of his surgery  Consults: None  Significant Diagnostic Studies: None  Treatments: surgery:anterior cervical decompression and fusion C 34 level  Discharge Exam: Blood pressure (!) 157/78, pulse 98, temperature 98.4 F (36.9 C), resp. rate 16, height 6\' 2"  (1.88 m), weight 84.8 kg (187 lb), SpO2 94 %. Neurologic: Alert and oriented X 3, normal strength and tone. Normal symmetric reflexes. Normal coordination and gait Wound:CDI  Disposition: Home     Medication List    ASK your doctor about these medications   aspirin EC 81 MG tablet Take 1 tablet (81 mg total) by mouth daily.   benazepril 20 MG tablet Commonly known as:  LOTENSIN Take 1 tablet (20 mg total) by mouth daily.   buPROPion 300 MG 24 hr tablet Commonly known as:  WELLBUTRIN XL TAKE ONE TABLET BY MOUTH EVERY MORNING   diazepam 5 MG tablet Commonly known as:  VALIUM TAKE ONE TABLET BY MOUTH EVERY 6 HOURS AS NEEDED FOR ANXIETY FOR MUSCLE SPASM   DULoxetine 20 MG capsule Commonly known as:  CYMBALTA Take 1 capsule (20 mg total) by mouth daily.   finasteride 5 MG tablet Commonly known as:  PROSCAR Take 5 mg by mouth daily.   fluticasone 50 MCG/ACT nasal spray Commonly known as:  FLONASE Place 2 sprays into both nostrils daily.   lamoTRIgine  200 MG tablet Commonly known as:  LAMICTAL Take 1 tablet (200 mg total) by mouth daily.   levorphanol 2 MG tablet Commonly known as:  LEVODROMORAN Take 6 mg by mouth every 8 (eight) hours.   metoprolol tartrate 25 MG tablet Commonly known as:  LOPRESSOR Take 1 tablet (25 mg total) by mouth 2 (two) times daily.   multivitamin with minerals tablet Take 1 tablet by mouth daily.   oxyCODONE-acetaminophen 10-325 MG tablet Commonly known as:  PERCOCET Take 1 tablet by mouth every 8 (eight) hours as needed. For pain   pantoprazole 40 MG tablet Commonly known as:  PROTONIX Take 1 tablet (40 mg total) by mouth daily.   pravastatin 40 MG tablet Commonly known as:  PRAVACHOL Take 1 tablet (40 mg total) by mouth every evening.   tamsulosin 0.4 MG Caps capsule Commonly known as:  FLOMAX Take 0.4 mg by mouth daily.        Signed: Peggyann Shoals, MD 04/05/2016, 8:26 AM

## 2016-04-07 ENCOUNTER — Encounter (HOSPITAL_COMMUNITY): Payer: Self-pay | Admitting: Neurosurgery

## 2016-04-14 ENCOUNTER — Other Ambulatory Visit: Payer: Self-pay | Admitting: Family Medicine

## 2016-04-20 NOTE — Telephone Encounter (Signed)
DONE

## 2016-04-24 ENCOUNTER — Telehealth: Payer: Self-pay | Admitting: Cardiology

## 2016-04-24 DIAGNOSIS — M5412 Radiculopathy, cervical region: Secondary | ICD-10-CM | POA: Diagnosis not present

## 2016-04-24 NOTE — Progress Notes (Signed)
Cardiology Office Note    Date:  04/26/2016   ID:  BERNARD BANKEN, DOB [birth year], MRN [id number]  PCP:  Ann Held, DO  Cardiologist:  Dr. Aundra Dubin   Chief Complaint: blood pressure issue  History of Present Illness:   Willie Andrews is a 60 y.o. male with hx of HTN, HLD, HLD, AI, tobacco smoking and palpitation who presented for evaluation of low blood pressure and dizziness.   Hx of palpitations. Event monitor 1/14 with no atrial fibrillation.  There was a short run of idioventricular rhythm. Last echo 11/2014 showed with EF 55-60%, mild AI, mild MR.  He underwent uncomplicated anterior cervical decompression and fusion C 3-4 level 04/04/16. Noted BP of 106/70 during post hospital appointment 04/24/16 with neuro surgeon. Also has dizziness. Dr. Aundra Dubin advised to cut back benazepril to 10mg .   Here today for f/u of BP. Today is first day he started benazepril to 10mg . He feels dizzy when he stand up from sitting to laying position, lasting for 5-10 seconds and resolves. This started after surgery. His appetite has been decreased but drinking ensure. Has lost 5lb since surgery.  No orthopnea, PND, syncope, LE edema, dyspnea, chest pain. He has moved back to [facility]- in process of establish care with PCP over there. Staying here at [facility] until cleared by Psychologist, sport and exercise. Admits to having intermittent headache. His neck pain is well controlled on current medication.    Past Medical History:  Diagnosis Date  . Arthritis   . Benign prostatic hypertrophy    Dr. Rosana Hoes  . Chronic pain   . Colon polyps   . Depression   . Dysrhythmia    palpitations -Aortic insuff  . Hemorrhoids   . High cholesterol   . History of echocardiogram    a.  Echo 5/16:  EF 55-60%, mild AI, mild MR, mild LAE.  Marland Kitchen Hypertension   . Ischemic colitis ([place])   . Migraine   . Pneumonia    teen  . Seasonal allergies   . Sleep apnea    no cpap  . Stones, urinary tract     Past Surgical History:   Procedure Laterality Date  . ANTERIOR CERVICAL DECOMP/DISCECTOMY FUSION N/A 04/04/2016   Procedure: cervical Three-Four Anterior cervical decompression/diskectomy/fusion/;with exploration of Cervical Four-Seven;  Surgeon: Erline Levine, MD;  Location: [facility];  Service: Neurosurgery;  Laterality: N/A;  right side approach  . [place], 2002, 2008  . BACK SURGERY  2012   Neuro Implant --elect. stimulator  . ELBOW SURGERY     Left 2008  . L5-S1    . NASAL SINUS SURGERY     2004  . NECK SURGERY     Disc 2002 and 2008  . SHOULDER SURGERY     rt  . SPINAL CORD STIMULATOR IMPLANT Right 2013   lower back  . SPINE SURGERY  85 94  . TRIGGER FINGER RELEASE Right   . WRIST SURGERY Left    laceration    Current Medications:   Prior to Admission medications   Medication Sig Start Date End Date Taking? Authorizing Provider  aspirin EC 81 MG tablet Take 1 tablet (81 mg total) by mouth daily. 07/23/12  Yes Larey Dresser, MD  benazepril (LOTENSIN) 10 MG tablet Take 1 tablet (10 mg total) by mouth daily. 04/25/16  Yes Larey Dresser, MD  buPROPion (WELLBUTRIN XL) 300 MG 24 hr tablet TAKE ONE TABLET BY MOUTH  EVERY MORNING 03/24/16  Yes Kathlee Nations, MD  diazepam (VALIUM) 5 MG tablet TAKE ONE TABLET BY MOUTH EVERY 6 HOURS AS NEEDED FOR ANXIETY FOR MUSCLE SPASM 02/24/16  Yes Yvonne R Lowne Chase, DO  DULoxetine (CYMBALTA) 20 MG capsule Take 1 capsule (20 mg total) by mouth daily. 03/24/16 03/24/17 Yes Kathlee Nations, MD  finasteride (PROSCAR) 5 MG tablet Take 5 mg by mouth daily.   Yes Historical Provider, MD  fluticasone (FLONASE) 50 MCG/ACT nasal spray Place 2 sprays into both nostrils daily. 09/27/15  Yes Yvonne R Lowne Chase, DO  lamoTRIgine (LAMICTAL) 200 MG tablet Take 1 tablet (200 mg total) by mouth daily. 03/24/16  Yes Kathlee Nations, MD  levorphanol (LEVODROMORAN) 2 MG tablet Take 6 mg by mouth every 8 (eight) hours.  12/15/15  Yes Historical Provider, MD  metoprolol tartrate  (LOPRESSOR) 25 MG tablet Take 1 tablet (25 mg total) by mouth 2 (two) times daily. 11/25/15  Yes Yvonne R Lowne Chase, DO  Multiple Vitamins-Minerals (MULTIVITAMIN WITH MINERALS) tablet Take 1 tablet by mouth daily.   Yes Historical Provider, MD  oxyCODONE-acetaminophen (PERCOCET) 10-325 MG per tablet Take 1 tablet by mouth every 8 (eight) hours as needed. For pain   Yes Historical Provider, MD  pantoprazole (PROTONIX) 40 MG tablet TAKE ONE TABLET BY MOUTH ONCE DAILY 04/14/16  Yes Alferd Apa Lowne Chase, DO  pravastatin (PRAVACHOL) 40 MG tablet Take 1 tablet (40 mg total) by mouth every evening. 11/25/15  Yes Alferd Apa Lowne Chase, DO  tamsulosin (FLOMAX) 0.4 MG CAPS capsule Take 0.4 mg by mouth daily.   Yes Historical Provider, MD    Allergies:   Butrans [buprenorphine hcl]; Toradol [ketorolac tromethamine]; Methadone; and Opana [oxymorphone]   Social History   Social History  . Marital status: Divorced    Spouse name: N/A  . Number of children: 2  . Years of education: N/A   Occupational History  . disabled    Social History Main Topics  . Smoking status: Former Smoker    Packs/day: 1.50    Years: 45.00    Start date: 12/28/2014  . Smokeless tobacco: Never Used     Comment: Using the E-cig  . Alcohol use 0.0 oz/week     Comment: none in 1 year  . Drug use: No  . Sexual activity: Not Asked   Other Topics Concern  . None   Social History Narrative  . None     Family History:  The patient's family history includes Cancer in his mother; Hypertension in his mother.   ROS:   Please see the history of present illness.    ROS All other systems reviewed and are negative.   PHYSICAL EXAM:   VS:  BP 106/64 (BP Location: Right Arm, Patient Position: Sitting, Cuff Size: Normal)   Pulse 62   Ht 6\' 2"  (1.88 m)   Wt 186 lb 6.4 oz (84.6 kg)   BMI 23.93 kg/m    GEN: Well nourished, well developed, in no acute distress  HEENT: Cervical collar in place Neck: no JVD, carotid bruits, or  masses Cardiac:RRR; soft SEM, rubs, or gallops,no edema  Respiratory:  clear to auscultation bilaterally, normal work of breathing GI: soft, nontender, nondistended, + BS MS: no deformity or atrophy  Skin: warm and dry, no rash Neuro:  Alert and Oriented x 3, Strength and sensation are intact Psych: euthymic mood, full affect  Wt Readings from Last 3 Encounters:  04/26/16 186 lb 6.4 oz (  84.6 kg)  04/04/16 187 lb (84.8 kg)  04/03/16 187 lb 4.8 oz (85 kg)      Studies/Labs Reviewed:   EKG:  EKG is not ordered today.    Recent Labs: 09/27/2015: TSH 0.92 11/25/2015: ALT 25 04/03/2016: BUN 13; Creatinine, Ser 1.19; Hemoglobin 15.8; Platelets 227; Potassium 4.0; Sodium 140   Lipid Panel    Component Value Date/Time   CHOL 160 11/25/2015 1243   TRIG 137.0 11/25/2015 1243   HDL 44.90 11/25/2015 1243   CHOLHDL 4 11/25/2015 1243   VLDL 27.4 11/25/2015 1243   LDLCALC 88 11/25/2015 1243   LDLDIRECT 93.6 07/22/2013 0938    Additional studies/ records that were reviewed today include:   Echocardiogram: 11/2014 LV EF: 55% -   60%  ------------------------------------------------------------------- Indications:      I35.9 Aortic Valve Disorder.  ------------------------------------------------------------------- History:   PMH:  Acquired from the patient and from the patient&'s chart.  PMH:  Aortic insufficiency. Sleep apnea.  Risk factors: Current tobacco use. Hypertension. Dyslipidemia.  ------------------------------------------------------------------- Study Conclusions  - Left ventricle: The cavity size was normal. Wall thickness was   normal. Systolic function was normal. The estimated ejection   fraction was in the range of 55% to 60%. - Aortic valve: There was mild regurgitation. - Mitral valve: There was mild regurgitation. - Left atrium: The atrium was mildly dilated. - Atrial septum: No defect or patent foramen ovale was identified.    ASSESSMENT & PLAN:    1. Dizziness/Hypotension - He is not orthostatic on vitals. Likely due to declined appetite since surgery. Decreased Benazepril 10mg  as recomended by Dr. Aundra Dubin (today is first day). Advised to get balance before walking. Keep hydrated. Try compression stocking if no improvement in about 5-7 days.  Keep Log of BP. If no improvement from above--> give as a call.   Orthostatic VS for the past 24 hrs:  BP- Lying Pulse- Lying BP- Sitting Pulse- Sitting BP- Standing at 0 minutes Pulse- Standing at 0 minutes  04/26/16 1421 106/64 62 99/62 74 101/60 77    2. Aortic insufficiency: Improved, mild on last echo.  The aortic valve is trileaflet.  The AI may have been related to HTN.  Continue BP control.       3. Palpitations:No further episode.   4. Smoking:  - Advised smoking cessation. Education given  5. Hyperlipidemia:  - 11/25/2015: Cholesterol 160; HDL 44.90; LDL Cholesterol 88; Triglycerides 137.0; VLDL 27.4  - continue statin  [place] [facility] Will call us if no improvement prior to moving back there once cleared by surgeon. Currently resides with [facility]. Will be followed by PCP there.    Medication Adjustments/Labs and Tests Ordered: Current medicines are reviewed at length with the patient today.  Concerns regarding medicines are outlined above.  Medication changes, Labs and Tests ordered today are listed in the Patient Instructions below. Patient Instructions  Your physician recommends that you continue on your current medications as directed. Please refer to the Current Medication list given to you today.  Your physician recommends that you schedule a follow-up appointment in: AS NEEDED    Jarrett Soho, PA  04/26/2016 2:54 PM    [facility] [address] [place], [place], [place]  [address] Phone: [phone]; Fax: [phone]

## 2016-04-24 NOTE — Telephone Encounter (Signed)
He can cut benazepril back to 10 mg daily with low BP.

## 2016-04-24 NOTE — Telephone Encounter (Signed)
Pt states he does not know heart rate and is unable to check BP and HR at home. Pt states he is staying well hydrated.  Pt states unless changing position he is asymptomatic. Pt is scheduled to see Vin, [place] 04/26/16, he did not have transportation to come before then. Pt advised keep to appt 04/26/16, I will forward to Dr Aundra Dubin for review.

## 2016-04-24 NOTE — Telephone Encounter (Signed)
I confirmed with pt that he is taking metoprolol 25 mg bid and benazepril 20mg  daily.

## 2016-04-24 NOTE — Telephone Encounter (Signed)
New message   Patient went to [facility] requesting patient to see Dr. Aundra Dubin due to blood pressure  .   Pt C/O BP issue: STAT if pt C/O blurred vision, one-sided weakness or slurred speech  1. What are your last 5 BP readings? 106/70 this am    2. Are you having any other symptoms (ex. Dizziness, headache, blurred vision, passed out)? Dizziness about last week in half.    3. What is your BP issue? appt made with APP on  10.11.2017 @ 3 pm due to transportation issues. Wants to let nurse know.

## 2016-04-24 NOTE — Telephone Encounter (Signed)
Pt states he had neck surgery 04/04/16. Pt states he went for post hospital appt today with neurosurgeon. Pt states BP low at visit today, could not get BP higher than 106/70.  Pt states about 1-1/2 weeks ago he developed dizziness associated with position changes lasting about 10 sec at a time

## 2016-04-25 MED ORDER — BENAZEPRIL HCL 10 MG PO TABS: 10.0000 mg | ORAL_TABLET | Freq: Every day | ORAL | 0 refills | Status: DC

## 2016-04-25 NOTE — Telephone Encounter (Signed)
Pt advised, verbalized understanding, agreed with plan.  

## 2016-04-25 NOTE — Telephone Encounter (Signed)
Pt advised to keep appt already scheduled 04/26/16 with Vin.

## 2016-04-26 ENCOUNTER — Ambulatory Visit (INDEPENDENT_AMBULATORY_CARE_PROVIDER_SITE_OTHER): Payer: PPO | Admitting: Physician Assistant

## 2016-04-26 ENCOUNTER — Encounter: Payer: Self-pay | Admitting: Physician Assistant

## 2016-04-26 VITALS — BP 106/64 | HR 62 | Ht 74.0 in | Wt 186.4 lb

## 2016-04-26 DIAGNOSIS — I959 Hypotension, unspecified: Secondary | ICD-10-CM | POA: Diagnosis not present

## 2016-04-26 DIAGNOSIS — I10 Essential (primary) hypertension: Secondary | ICD-10-CM

## 2016-04-26 DIAGNOSIS — E785 Hyperlipidemia, unspecified: Secondary | ICD-10-CM

## 2016-04-26 DIAGNOSIS — I359 Nonrheumatic aortic valve disorder, unspecified: Secondary | ICD-10-CM

## 2016-04-26 DIAGNOSIS — R42 Dizziness and giddiness: Secondary | ICD-10-CM | POA: Diagnosis not present

## 2016-04-26 NOTE — Patient Instructions (Addendum)
Your physician recommends that you continue on your current medications as directed. Please refer to the Current Medication list given to you today.    Your physician recommends that you schedule a follow-up appointment in:  AS NEEDED 

## 2016-05-05 ENCOUNTER — Telehealth: Payer: Self-pay | Admitting: Physician Assistant

## 2016-05-05 DIAGNOSIS — I10 Essential (primary) hypertension: Secondary | ICD-10-CM

## 2016-05-05 NOTE — Telephone Encounter (Signed)
Called pt back.  He stated that he was seen in office 04/26/16 for dizziness.  He states that every time he goes from sitting or lying to standing, he is dizzy.  He states that this didn't start until after his sx on his neck in September.    On last o/v Benazepril was decreased to 10 mg qd, pt states he has not had any way to check his bp, but he will after the weekend.  Pt was advised that I would send Dr. Aundra Dubin and Robbie Lis, PA-C a message to let them know, but in the mean time, he should call his Neurosurgeons office to let them know as well.  Pt agreeable with this plan.

## 2016-05-05 NOTE — Telephone Encounter (Signed)
New Message  Pt c/o medication issue:  1. Name of Medication: Benazepril  2. How are you currently taking this medication (dosage and times per day)? 100mg   3. Are you having a reaction (difficulty breathing--STAT)? Per pt Dizziness, not change in how he has been feeling   4. What is your medication issue? Pt would like to speak with RN on further instructions. Please call back to discuss

## 2016-05-08 NOTE — Telephone Encounter (Signed)
Will forward to Dr. Aundra Dubin and Robbie Lis, PA to review BP readings.

## 2016-05-08 NOTE — Telephone Encounter (Signed)
Pt c/o BP issue:  1. What are your last 5 BP readings? Sat 130pm 181/98 pulse 89, Sun 1030am 169/90 pulse 82, and Mon 1055am 160/93 pulse 85 2. Are you having any other symptoms (ex. Dizziness, headache, blurred vision, passed out)? Dizziness upon standing from laying down or sitting 3. What is your medication issue? none  pls advise [phone]

## 2016-05-09 NOTE — Telephone Encounter (Signed)
Increase benazepril to 10 mg bid. Wear compression stockings if having orthostatic intolerance.

## 2016-05-10 MED ORDER — METOPROLOL TARTRATE 25 MG PO TABS: 25.0000 mg | ORAL_TABLET | Freq: Two times a day (BID) | ORAL | 1 refills | Status: AC

## 2016-05-10 MED ORDER — BENAZEPRIL HCL 10 MG PO TABS: 10.0000 mg | ORAL_TABLET | Freq: Two times a day (BID) | ORAL | 1 refills | Status: AC

## 2016-05-10 NOTE — Telephone Encounter (Signed)
Pt advised,verbalized understanding. 

## 2016-05-10 NOTE — Telephone Encounter (Signed)
LMTCB

## 2016-05-24 DIAGNOSIS — M5020 Other cervical disc displacement, unspecified cervical region: Secondary | ICD-10-CM | POA: Diagnosis not present

## 2016-06-19 DIAGNOSIS — M47812 Spondylosis without myelopathy or radiculopathy, cervical region: Secondary | ICD-10-CM | POA: Diagnosis not present

## 2016-06-19 DIAGNOSIS — M5416 Radiculopathy, lumbar region: Secondary | ICD-10-CM | POA: Diagnosis not present

## 2016-06-19 DIAGNOSIS — G894 Chronic pain syndrome: Secondary | ICD-10-CM | POA: Diagnosis not present

## 2016-06-19 DIAGNOSIS — M5417 Radiculopathy, lumbosacral region: Secondary | ICD-10-CM | POA: Diagnosis not present

## 2016-06-19 DIAGNOSIS — M791 Myalgia: Secondary | ICD-10-CM | POA: Diagnosis not present

## 2016-06-19 DIAGNOSIS — Z79891 Long term (current) use of opiate analgesic: Secondary | ICD-10-CM | POA: Diagnosis not present

## 2016-06-28 ENCOUNTER — Telehealth: Payer: Self-pay | Admitting: Family Medicine

## 2016-06-28 NOTE — Telephone Encounter (Signed)
11/25/15 [facility] PPPS, SUBSEQ VISIT [id number] patient denied appointment due to relocation and states PCP is aware.

## 2016-06-29 NOTE — Telephone Encounter (Signed)
Pt had AWV 11/25/15 w/ PCP. Is he still a pt of this office or has he established w/ another PCP?

## 2016-06-29 NOTE — Telephone Encounter (Signed)
Patient didn't mention establishing with another provider only stated PCP is aware of relocation and [place]
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
## 2020-11-30 ENCOUNTER — Encounter: Payer: Self-pay | Admitting: Internal Medicine
